# Patient Record
Sex: Female | Born: 1955
Health system: Southern US, Community
[De-identification: ages and names within clinical notes are randomized; demographics above are authoritative.]

## PROBLEM LIST (undated history)

## (undated) DIAGNOSIS — D689 Coagulation defect, unspecified: Secondary | ICD-10-CM

## (undated) DIAGNOSIS — H269 Unspecified cataract: Secondary | ICD-10-CM

## (undated) DIAGNOSIS — J342 Deviated nasal septum: Secondary | ICD-10-CM

## (undated) DIAGNOSIS — M1990 Unspecified osteoarthritis, unspecified site: Secondary | ICD-10-CM

## (undated) DIAGNOSIS — K219 Gastro-esophageal reflux disease without esophagitis: Secondary | ICD-10-CM

## (undated) DIAGNOSIS — E785 Hyperlipidemia, unspecified: Secondary | ICD-10-CM

## (undated) DIAGNOSIS — D369 Benign neoplasm, unspecified site: Secondary | ICD-10-CM

## (undated) DIAGNOSIS — G709 Myoneural disorder, unspecified: Secondary | ICD-10-CM

## (undated) DIAGNOSIS — R112 Nausea with vomiting, unspecified: Secondary | ICD-10-CM

## (undated) DIAGNOSIS — T7840XA Allergy, unspecified, initial encounter: Secondary | ICD-10-CM

## (undated) DIAGNOSIS — Z9889 Other specified postprocedural states: Secondary | ICD-10-CM

## (undated) DIAGNOSIS — E039 Hypothyroidism, unspecified: Secondary | ICD-10-CM

## (undated) DIAGNOSIS — K759 Inflammatory liver disease, unspecified: Secondary | ICD-10-CM

## (undated) DIAGNOSIS — J329 Chronic sinusitis, unspecified: Secondary | ICD-10-CM

## (undated) HISTORY — DX: Myoneural disorder, unspecified: G70.9

## (undated) HISTORY — PX: HERPES SIMPLEX VIRUS DFA: LAB15028

## (undated) HISTORY — DX: Unspecified osteoarthritis, unspecified site: M19.90

## (undated) HISTORY — DX: Allergy, unspecified, initial encounter: T78.40XA

## (undated) HISTORY — DX: Benign neoplasm, unspecified site: D36.9

## (undated) HISTORY — DX: Gastro-esophageal reflux disease without esophagitis: K21.9

## (undated) HISTORY — PX: HAND SURGERY: SHX662

## (undated) HISTORY — DX: Coagulation defect, unspecified: D68.9

## (undated) HISTORY — DX: Unspecified cataract: H26.9

## (undated) HISTORY — PX: ANTERIOR CRUCIATE LIGAMENT REPAIR: SHX115

## (undated) HISTORY — PX: KNEE SURGERY: SHX244

## (undated) HISTORY — PX: TUBAL LIGATION: SHX77

---

## 2004-12-23 ENCOUNTER — Emergency Department (HOSPITAL_COMMUNITY): Admission: EM | Admit: 2004-12-23 | Discharge: 2004-12-23 | Payer: Self-pay | Admitting: Emergency Medicine

## 2006-11-26 ENCOUNTER — Ambulatory Visit: Payer: Self-pay | Admitting: Pulmonary Disease

## 2006-12-04 ENCOUNTER — Encounter: Admission: RE | Admit: 2006-12-04 | Discharge: 2006-12-04 | Payer: Self-pay | Admitting: *Deleted

## 2006-12-08 ENCOUNTER — Ambulatory Visit (HOSPITAL_COMMUNITY): Admission: RE | Admit: 2006-12-08 | Discharge: 2006-12-08 | Payer: Self-pay | Admitting: Gastroenterology

## 2006-12-08 ENCOUNTER — Encounter (INDEPENDENT_AMBULATORY_CARE_PROVIDER_SITE_OTHER): Payer: Self-pay | Admitting: Gastroenterology

## 2006-12-26 ENCOUNTER — Ambulatory Visit: Payer: Self-pay | Admitting: Pulmonary Disease

## 2006-12-29 ENCOUNTER — Encounter: Admission: RE | Admit: 2006-12-29 | Discharge: 2006-12-29 | Payer: Self-pay | Admitting: *Deleted

## 2006-12-31 ENCOUNTER — Emergency Department (HOSPITAL_COMMUNITY): Admission: EM | Admit: 2006-12-31 | Discharge: 2006-12-31 | Payer: Self-pay | Admitting: Family Medicine

## 2007-05-13 ENCOUNTER — Ambulatory Visit (HOSPITAL_BASED_OUTPATIENT_CLINIC_OR_DEPARTMENT_OTHER): Admission: RE | Admit: 2007-05-13 | Discharge: 2007-05-13 | Payer: Self-pay | Admitting: Pulmonary Disease

## 2007-05-25 ENCOUNTER — Emergency Department (HOSPITAL_COMMUNITY): Admission: EM | Admit: 2007-05-25 | Discharge: 2007-05-25 | Payer: Self-pay | Admitting: Family Medicine

## 2007-06-05 ENCOUNTER — Ambulatory Visit: Payer: Self-pay | Admitting: Pulmonary Disease

## 2007-06-08 DIAGNOSIS — J309 Allergic rhinitis, unspecified: Secondary | ICD-10-CM

## 2007-06-08 DIAGNOSIS — G4700 Insomnia, unspecified: Secondary | ICD-10-CM

## 2007-06-11 ENCOUNTER — Encounter (INDEPENDENT_AMBULATORY_CARE_PROVIDER_SITE_OTHER): Payer: Self-pay | Admitting: *Deleted

## 2008-10-11 ENCOUNTER — Ambulatory Visit: Payer: Self-pay | Admitting: Pulmonary Disease

## 2008-10-11 DIAGNOSIS — R0989 Other specified symptoms and signs involving the circulatory and respiratory systems: Secondary | ICD-10-CM

## 2008-10-11 DIAGNOSIS — R0609 Other forms of dyspnea: Secondary | ICD-10-CM

## 2009-02-05 ENCOUNTER — Emergency Department (HOSPITAL_COMMUNITY): Admission: EM | Admit: 2009-02-05 | Discharge: 2009-02-05 | Payer: Self-pay | Admitting: Family Medicine

## 2010-07-22 ENCOUNTER — Encounter: Payer: Self-pay | Admitting: Obstetrics and Gynecology

## 2010-07-22 ENCOUNTER — Encounter: Payer: Self-pay | Admitting: *Deleted

## 2010-10-07 LAB — POCT RAPID STREP A (OFFICE): Streptococcus, Group A Screen (Direct): NEGATIVE

## 2010-11-13 NOTE — Op Note (Signed)
Sherry [name], Sherry [name]           ACCOUNT NO.:  [id number]   MEDICAL RECORD NO.:  [id number]          PATIENT TYPE:  AMB   LOCATION:  ENDO                         FACILITY:  [facility]   PHYSICIAN:  Anselmo Rod, M.D.  DATE OF BIRTH:  [birth year]   DATE OF PROCEDURE:  12/08/2006  DATE OF DISCHARGE:                               OPERATIVE REPORT   PROCEDURE PERFORMED:  Colonoscopy with snare polypectomy x1.   ENDOSCOPIST:  Anselmo Rod, M.D.   INSTRUMENT USED:  Pentax video colonoscope.   INDICATIONS FOR PROCEDURE:  A 55 year old white female undergoing  screening colonoscopy to rule out colonic polyps, masses, etc.  The  patient has a history of diarrhea in the past that has improved with a  high-fiber diet.  There is no known family history of colon cancer.   PREPROCEDURE PREPARATION:  Informed consent was procured from the  patient. The patient was fasted for 4 hours prior to the procedure and  prepped with 20 OsmoPrep pills the night of and 12 OsmoPrep pills the  morning of the procedure.  Risks and benefits of the procedure including  a 10% miss rate of cancer and polyps were discussed with the patient as  well.   PREPROCEDURE PHYSICAL:  VITAL SIGNS:  The patient had stable vital  signs.  NECK:  Supple.  CHEST:  Clear to auscultation.  S1, S2, regular.  ABDOMEN:  Soft with normal bowel sounds.   DESCRIPTION OF PROCEDURE:  The patient was placed in the left lateral  decubitus position and sedated with 100 mcg of Fentanyl and 10 mg of  Versed given intravenously in slow incremental doses.  Once the patient  was adequately sedate and maintained on low-flow oxygen and continuous  cardiac monitoring, the Pentax video colonoscope was advanced from the  rectum to the cecum.  A small sessile polyp was removed via hot snare  from the proximal right colon.  Small internal hemorrhoids were seen on  retroflexion.  The terminal ileum appeared healthy and without lesions.  No other  masses or polyps were seen.  There was no evidence of  diverticulosis.  The patient tolerated the procedure well without  immediate complications.   IMPRESSION:  1. Small sessile polyp removed by hot snare from the proximal right      colon.  2. Small internal hemorrhoids seen on retroflexion.  3. No evidence of diverticulosis.  4. Otherwise normal colonoscopy up to the terminal ileum.   RECOMMENDATIONS:  1. Await pathology results.  2. Avoid all nonsteroidals including aspirin for the next 2 weeks.  3. Repeat colonoscopy depending on pathology results.  4. Outpatient follow-up as need arises in the future.      Anselmo Rod, M.D.  Electronically Signed     JNM/MEDQ  D:  12/08/2006  T:  12/09/2006  Job:  [id number]   cc:   Gerri Spore B. Earlene Plater, M.D.  Fax: [phone]

## 2010-11-13 NOTE — Procedures (Signed)
NAME:  Sherry [name], Sherry [name]           ACCOUNT NO.:  [id number]   MEDICAL RECORD NO.:  [id number]          PATIENT TYPE:  OUT   LOCATION:  SLEEP CENTER                 FACILITY:  [facility]   PHYSICIAN:  Barbaraann Share, MD,FCCPDATE OF BIRTH:  1955/11/03   DATE OF STUDY:  05/13/2007                            NOCTURNAL POLYSOMNOGRAM   REFERRING PHYSICIAN:  Barbaraann Share, MD,FCCP   INDICATIONS FOR STUDY:  Hypersomnia with sleep apnea.   EPWORTH SLEEPINESS SCORE:  6   MEDICATIONS:   SLEEP ARCHITECTURE:  The patient had a total sleep time of 395 minutes  with decreased REM and never achieved slow wave sleep.  Sleep onset  latency was normal at 5 minutes, and REM onset was mildly prolonged at  102 minutes.  Sleep efficiency was fairly good at 93%.   RESPIRATORY DATA:  The patient was found to have 3 obstructive hypopneas  and no apneas for an apnea/hypopnea index of 0.5 events per hour.  The  events were not positional, and there was mild-to-moderate snoring noted  throughout.   OXYGEN DATA:  There is O2 desaturation as low at 88% transiently.   CARDIAC DATA:  No clinically significant cardiac arrhythmias were noted.   MOVEMENT-PARASOMNIA:  The patient was found to have no abnormal leg  jerks or behaviors.  She was found to have 68 spontaneous arousals  during the night.   IMPRESSIONS-RECOMMENDATIONS:  1. Small numbers of obstructive events which do not meet the      apnea/hypopnea index criteria for the obstructive sleep apnea      syndrome.  2. Large numbers of spontaneous arousals noted, with no obvious      etiology from the sleep study.     Barbaraann Share, MD,FCCP  Diplomate, American Board of Sleep  Medicine  Electronically Signed    KMC/MEDQ  D:  06/06/2007 11:47:11  T:  06/07/2007 10:06:41  Job:  [id number]

## 2010-11-13 NOTE — Assessment & Plan Note (Signed)
[facility]                             PULMONARY OFFICE NOTE   MARGRET, MOAT                  MRN:          [id number]  DATE:12/26/2006                            DOB:          03-14-1956    REFERRING PHYSICIAN:  [facility]   SLEEP MEDICINE CONSULTATION:   HISTORY OF PRESENT ILLNESS:  Patient is a 55 year old white female who I  have been asked to see for sleeping difficulties.  Patient states that  she has had great difficulty with both sleep onset and sleep  maintenance.  She used to work the night shift and weekend options for 8-  9 years but now has started working a job from 6 a.m. to 6 p.m. but also  works a second job at night on varying nights.  She typically gets to  bed between 9 and 11 and gets up at 4:15 a.m. whenever she goes to work  and 9 a.m. when she is off.  She has not rested upon arising.  Patient  states that very frequently, she will not get to sleep until 1 a.m. and  will often sit in the bed and toss and turn if she is unable to initiate  sleep.  Then, even if she does fall asleep, she will awaken very often,  3-4 times, and have difficulty getting back to sleep.  Unfortunately,  she has a 87 year old son who sleeps in the same room with her.  She  does not watch TV or read in bed.  She does have a significant sense in  frustration in initiating sleep.  Patient does note significant sleep  pressure during the day with periods of inactivity.  This does interfere  with her concentration.  She can doze very easily with TV or movies.  She has been noted to have loud snoring, and she admits to some choking  arousals.   PAST MEDICAL HISTORY:  1. Allergic rhinitis.  2. Multiple orthopedic procedures.   CURRENT MEDICATIONS:  1. Wellbutrin 150 daily.  2. Fish oil 1000 daily.  3. Zyrtec p.r.n.   Patient has no known drug allergies.   SOCIAL HISTORY:  She works as a Engineer, civil (consulting).  She has never smoked.  She is  divorced and has a child.   FAMILY HISTORY:  Remarkable for father having had lung cancer and mother  having had leukemia.   REVIEW OF SYSTEMS:  As per history of present illness.  Also see patient  intake form documented on the chart.   PHYSICAL EXAMINATION:  GENERAL:  In general, she is an overweight female  in no acute distress.  VITAL SIGNS:  Blood pressure 130/68, pulse 92, temperature 97.7.  Weight  is 188 pounds.  She is 5 feet 6 inches tall.  O2 saturation on room air  is 100%.  HEENT:  Pupils are equal, round and reactive to light and accommodation.  Extraocular muscles are intact.  Nares showed a deviated septum to the  left with partial obstruction.  Oropharynx is clear.  NECK:  Supple without JVD or lymphadenopathy.  There is no palpable  thyromegaly.  CHEST:  Totally clear to auscultation.  CARDIAC:  Regular rate and rhythm.  No murmurs, rubs or gallops.  ABDOMEN:  Soft and nontender with good bowel sounds.  GENITAL/BREAST/RECTAL:  Not done, not indicated.  LOWER EXTREMITIES:  Without edema.  Pulses are intact distally.  NEUROLOGIC:  Alert and oriented with no obvious motor deficits.   IMPRESSION:  Psychophysiologic insomnia:  Patient clearly has a sense of  frustration about initiating sleep, and this has definitely been a  longstanding problem for her.  I certainly cannot exclude the  possibility of obstructive sleep apnea; however, she would never be able  to have a successful sleep study with her degree of insomnia at this  time.  I also think there are significant sleep hygiene issues with her  son sleeping in the room and her irregular work schedule.  At this point  in time, I would like to try and get her initiating and maintaining  sleep a little better, to try and see if she has significant improvement  in her daytime symptoms.  If not, then she will need further evaluation  for a sleep disordered breathing.   PLAN:  1. I have asked the patient to work on  sleep hygiene, such as a      regular sleep-work cycle and also asking her son to sleep in a      different room.  She states that this may be very difficult.  2. A trial of stimulus control therapy where she does not stay in bed      if she is not able to initiate sleep within 20-30 minutes.  I have      gone over the details of this therapy with her.  3. A trial of trazodone 50 mg nightly along with Rozarem 8 mg nightly      to help break her insomnia cycle and hopefully get her sleeping      better.  This will be used just short-term.  4. Patient is to follow up in 3-4 weeks or sooner if there are      problems.     Barbaraann Share, MD,FCCP  Electronically Signed    KMC/MedQ  DD: 12/30/2006  DT: 12/30/2006  Job #: [id number]   cc:   [facility]

## 2011-08-26 ENCOUNTER — Encounter (HOSPITAL_BASED_OUTPATIENT_CLINIC_OR_DEPARTMENT_OTHER): Payer: Self-pay | Admitting: *Deleted

## 2011-08-30 ENCOUNTER — Ambulatory Visit (HOSPITAL_BASED_OUTPATIENT_CLINIC_OR_DEPARTMENT_OTHER): Payer: 59 | Admitting: Anesthesiology

## 2011-08-30 ENCOUNTER — Encounter (HOSPITAL_BASED_OUTPATIENT_CLINIC_OR_DEPARTMENT_OTHER): Payer: Self-pay | Admitting: *Deleted

## 2011-08-30 ENCOUNTER — Encounter (HOSPITAL_BASED_OUTPATIENT_CLINIC_OR_DEPARTMENT_OTHER)
Admission: RE | Disposition: A | Discharge status: Home / Self Care | Payer: Self-pay | Source: Ambulatory Visit | Attending: Otolaryngology

## 2011-08-30 ENCOUNTER — Ambulatory Visit (HOSPITAL_BASED_OUTPATIENT_CLINIC_OR_DEPARTMENT_OTHER)
Admission: RE | Admit: 2011-08-30 | Discharge: 2011-08-30 | Disposition: A | Discharge status: Home / Self Care | Payer: 59 | Source: Ambulatory Visit | Attending: Otolaryngology | Admitting: Otolaryngology

## 2011-08-30 ENCOUNTER — Encounter (HOSPITAL_BASED_OUTPATIENT_CLINIC_OR_DEPARTMENT_OTHER): Payer: Self-pay | Admitting: Anesthesiology

## 2011-08-30 DIAGNOSIS — J343 Hypertrophy of nasal turbinates: Secondary | ICD-10-CM | POA: Insufficient documentation

## 2011-08-30 DIAGNOSIS — J342 Deviated nasal septum: Secondary | ICD-10-CM | POA: Diagnosis present

## 2011-08-30 DIAGNOSIS — J329 Chronic sinusitis, unspecified: Secondary | ICD-10-CM | POA: Diagnosis present

## 2011-08-30 HISTORY — DX: Chronic sinusitis, unspecified: J32.9

## 2011-08-30 HISTORY — DX: Other specified postprocedural states: R11.2

## 2011-08-30 HISTORY — PX: NASAL SINUS SURGERY: SHX719

## 2011-08-30 HISTORY — DX: Deviated nasal septum: J34.2

## 2011-08-30 HISTORY — PX: NASAL SEPTOPLASTY W/ TURBINOPLASTY: SHX2070

## 2011-08-30 HISTORY — DX: Hypothyroidism, unspecified: E03.9

## 2011-08-30 HISTORY — DX: Inflammatory liver disease, unspecified: K75.9

## 2011-08-30 HISTORY — DX: Other specified postprocedural states: Z98.890

## 2011-08-30 SURGERY — SEPTOPLASTY, NOSE, WITH NASAL TURBINATE REDUCTION
Anesthesia: General | Site: Nose | Wound class: Clean Contaminated

## 2011-08-30 MED ORDER — LIDOCAINE-EPINEPHRINE 1 %-1:100000 IJ SOLN
INTRAMUSCULAR | Status: DC | PRN
  Administered 2011-08-30: 8 mL

## 2011-08-30 MED ORDER — FENTANYL CITRATE 0.05 MG/ML IJ SOLN
INTRAMUSCULAR | Status: DC | PRN
  Administered 2011-08-30 (×2): 25 ug via INTRAVENOUS
  Administered 2011-08-30: 100 ug via INTRAVENOUS

## 2011-08-30 MED ORDER — CEFAZOLIN SODIUM 1-5 GM-% IV SOLN
INTRAVENOUS | Status: DC | PRN
  Administered 2011-08-30: 2 g via INTRAVENOUS

## 2011-08-30 MED ORDER — AMOXICILLIN-POT CLAVULANATE 500-125 MG PO TABS: 1.0000 | ORAL_TABLET | Freq: Two times a day (BID) | ORAL | Status: AC

## 2011-08-30 MED ORDER — DEXAMETHASONE SODIUM PHOSPHATE 4 MG/ML IJ SOLN
INTRAMUSCULAR | Status: DC | PRN
  Administered 2011-08-30: 10 mg via INTRAVENOUS

## 2011-08-30 MED ORDER — LIDOCAINE HCL (CARDIAC) 20 MG/ML IV SOLN
INTRAVENOUS | Status: DC | PRN
  Administered 2011-08-30: 40 mg via INTRAVENOUS

## 2011-08-30 MED ORDER — MEPERIDINE HCL 25 MG/ML IJ SOLN: 6.2500 mg | INTRAMUSCULAR | Status: DC | PRN

## 2011-08-30 MED ORDER — PROPOFOL 10 MG/ML IV EMUL
INTRAVENOUS | Status: DC | PRN
  Administered 2011-08-30: 130 mg via INTRAVENOUS

## 2011-08-30 MED ORDER — OXYMETAZOLINE HCL 0.05 % NA SOLN
NASAL | Status: DC | PRN
  Administered 2011-08-30: 1 via NASAL

## 2011-08-30 MED ORDER — MUPIROCIN 2 % EX OINT
TOPICAL_OINTMENT | CUTANEOUS | Status: DC | PRN
  Administered 2011-08-30: 1 via NASAL

## 2011-08-30 MED ORDER — ONDANSETRON HCL 4 MG/2ML IJ SOLN
INTRAMUSCULAR | Status: DC | PRN
  Administered 2011-08-30: 4 mg via INTRAVENOUS

## 2011-08-30 MED ORDER — SUCCINYLCHOLINE CHLORIDE 20 MG/ML IJ SOLN
INTRAMUSCULAR | Status: DC | PRN
  Administered 2011-08-30: 110 mg via INTRAVENOUS

## 2011-08-30 MED ORDER — MIDAZOLAM HCL 5 MG/5ML IJ SOLN
INTRAMUSCULAR | Status: DC | PRN
  Administered 2011-08-30: 2 mg via INTRAVENOUS

## 2011-08-30 MED ORDER — ONDANSETRON HCL 4 MG PO TABS: 4.0000 mg | ORAL_TABLET | Freq: Three times a day (TID) | ORAL | Status: AC | PRN

## 2011-08-30 MED ORDER — HYDROMORPHONE HCL PF 1 MG/ML IJ SOLN: 0.2500 mg | INTRAMUSCULAR | Status: DC | PRN

## 2011-08-30 MED ORDER — DROPERIDOL 2.5 MG/ML IJ SOLN
0.6250 mg | Freq: Once | INTRAMUSCULAR | Status: AC
  Administered 2011-08-30: 0.625 mg via INTRAVENOUS

## 2011-08-30 MED ORDER — HYDROCODONE-ACETAMINOPHEN 5-325 MG PO TABS: 1.0000 | ORAL_TABLET | Freq: Four times a day (QID) | ORAL | Status: AC | PRN

## 2011-08-30 MED ORDER — LACTATED RINGERS IV SOLN
INTRAVENOUS | Status: DC
  Administered 2011-08-30 (×2): via INTRAVENOUS

## 2011-08-30 MED ORDER — ACETAMINOPHEN 10 MG/ML IV SOLN
1000.0000 mg | Freq: Once | INTRAVENOUS | Status: AC
  Administered 2011-08-30: 1000 mg via INTRAVENOUS

## 2011-08-30 SURGICAL SUPPLY — 61 items
ATTRACTOMAT 16X20 MAGNETIC DRP (DRAPES) IMPLANT
BLADE RAD40 ROTATE 4M 4 5PK (BLADE) ×1 IMPLANT
BLADE RAD60 ROTATE M4 4 5PK (BLADE) IMPLANT
BLADE ROTATE RAD12 5PK M4 4MM (BLADE) IMPLANT
BLADE SURG 15 STRL LF DISP TIS (BLADE) ×1 IMPLANT
BLADE SURG 15 STRL SS (BLADE) ×2
BLADE TRICUT ROTATE M4 4 5PK (BLADE) ×1 IMPLANT
BUR HS RAD FRONTAL 3 (BURR) IMPLANT
CANISTER SUC SOCK COL 7 IN (MISCELLANEOUS) ×2 IMPLANT
CANISTER SUCTION 1200CC (MISCELLANEOUS) ×3 IMPLANT
CATH SINUS BALLN 7X16 (CATHETERS) IMPLANT
CATH SINUS BALLN RELIEV 6X16 (SINUPLASTY) IMPLANT
CATH SINUS GUIDE F-70 (CATHETERS) IMPLANT
CATH SINUS GUIDE M/110 (CATHETERS) IMPLANT
CATH SINUS IRRIGATION 2.0 (CATHETERS) IMPLANT
CLOTH BEACON ORANGE TIMEOUT ST (SAFETY) ×2 IMPLANT
COAGULATOR SUCT SWTCH 10FR 6 (ELECTROSURGICAL) ×1 IMPLANT
DECANTER SPIKE VIAL GLASS SM (MISCELLANEOUS) IMPLANT
DEVICE INFLATION 20/61 (MISCELLANEOUS) IMPLANT
DRESSING NASAL KENNEDY 3.5X.9 (MISCELLANEOUS) IMPLANT
DRSG NASAL KENNEDY 3.5X.9 (MISCELLANEOUS)
DRSG NASOPORE 8CM (GAUZE/BANDAGES/DRESSINGS) IMPLANT
ELECT COATED BLADE 2.86 ST (ELECTRODE) IMPLANT
ELECT REM PT RETURN 9FT ADLT (ELECTROSURGICAL)
ELECTRODE REM PT RTRN 9FT ADLT (ELECTROSURGICAL) IMPLANT
GLOVE BIO SURGEON STRL SZ 6.5 (GLOVE) ×1 IMPLANT
GLOVE BIOGEL M 7.0 STRL (GLOVE) ×4 IMPLANT
GLOVE SKINSENSE NS SZ7.0 (GLOVE) ×1
GLOVE SKINSENSE STRL SZ7.0 (GLOVE) IMPLANT
GOWN PREVENTION PLUS XLARGE (GOWN DISPOSABLE) ×4 IMPLANT
HANDLE SINUS GUIDE LP (INSTRUMENTS) IMPLANT
HANDPIECE HYDRODEBRIDER FRONT (BLADE) IMPLANT
IV NS 500ML (IV SOLUTION) ×2
IV NS 500ML BAXH (IV SOLUTION) ×1 IMPLANT
NEEDLE 27GAX1X1/2 (NEEDLE) ×2 IMPLANT
NS IRRIG 1000ML POUR BTL (IV SOLUTION) IMPLANT
PACK BASIN DAY SURGERY FS (CUSTOM PROCEDURE TRAY) ×2 IMPLANT
PACK ENT DAY SURGERY (CUSTOM PROCEDURE TRAY) ×2 IMPLANT
PENCIL BUTTON HOLSTER BLD 10FT (ELECTRODE) IMPLANT
SET EXT MALE ROTATING LL 32IN (MISCELLANEOUS) ×2 IMPLANT
SET IV EXT TUBING FEMALE 31 (MISCELLANEOUS) ×1 IMPLANT
SOLUTION BUTLER CLEAR DIP (MISCELLANEOUS) ×2 IMPLANT
SPLINT NASAL DOYLE BI-VL (GAUZE/BANDAGES/DRESSINGS) ×2 IMPLANT
SPONGE GAUZE 2X2 12PLY UNSTER (GAUZE/BANDAGES/DRESSINGS) ×1 IMPLANT
SPONGE GAUZE 2X2 8PLY STRL LF (GAUZE/BANDAGES/DRESSINGS) ×2 IMPLANT
SPONGE NEURO XRAY DETECT 1X3 (DISPOSABLE) ×2 IMPLANT
SPONGE SURGIFOAM ABS GEL 12-7 (HEMOSTASIS) IMPLANT
SUT ETHILON 3 0 PS 1 (SUTURE) ×2 IMPLANT
SUT PLAIN 4 0 ~~LOC~~ 1 (SUTURE) ×2 IMPLANT
SUT SILK 3 0 PS 1 (SUTURE) IMPLANT
SYR 3ML 23GX1 SAFETY (SYRINGE) IMPLANT
SYSTEM HYDRODEBRIDER (MISCELLANEOUS) IMPLANT
SYSTEM RELIEVA LUMA ILLUM (SINUPLASTY) IMPLANT
TAPE SURG TRANSPORE 1 IN (GAUZE/BANDAGES/DRESSINGS) IMPLANT
TAPE SURGICAL TRANSPORE 1 IN (GAUZE/BANDAGES/DRESSINGS) ×1
TOWEL OR 17X24 6PK STRL BLUE (TOWEL DISPOSABLE) ×2 IMPLANT
TUBE CONNECTING 20X1/4 (TUBING) ×2 IMPLANT
TUBE SALEM SUMP 12R W/ARV (TUBING) IMPLANT
TUBE SALEM SUMP 16 FR W/ARV (TUBING) ×1 IMPLANT
WATER STERILE IRR 1000ML POUR (IV SOLUTION) ×4 IMPLANT
YANKAUER SUCT BULB TIP NO VENT (SUCTIONS) ×2 IMPLANT

## 2011-08-30 NOTE — Anesthesia Postprocedure Evaluation (Signed)
  Anesthesia Post-op Note  Patient: Sherry [name] [id number]  Procedure(s) Performed: Procedure(s) (LRB): NASAL SEPTOPLASTY WITH TURBINATE REDUCTION (N/A) ENDOSCOPIC SINUS SURGERY (N/A)  Patient Location: PACU  Anesthesia Type: General  Level of Consciousness: awake  Airway and Oxygen Therapy: Patient Spontanous Breathing and Patient connected to face mask oxygen  Post-op Pain: mild  Post-op Assessment: Post-op Vital signs reviewed, Patient's Cardiovascular Status Stable, Respiratory Function Stable, Patent Airway and NAUSEA AND VOMITING PRESENT  Post-op Vital Signs: Reviewed and stable  Complications: No apparent anesthesia complications

## 2011-08-30 NOTE — Anesthesia Procedure Notes (Signed)
Procedure Name: Intubation Date/Time: 08/30/2011 9:25 AM Performed by: Radford Pax Pre-anesthesia Checklist: Patient identified, Emergency Drugs available, Suction available, Patient being monitored and Timeout performed Patient Re-evaluated:Patient Re-evaluated prior to inductionOxygen Delivery Method: Circle System Utilized Preoxygenation: Pre-oxygenation with 100% oxygen Intubation Type: IV induction Ventilation: Mask ventilation without difficulty Laryngoscope Size: Miller and 3 Grade View: Grade I Tube type: Oral Tube size: 7.0 mm Number of attempts: 1 (cords open and clear, atraumatic) Airway Equipment and Method: stylet Placement Confirmation: ETT inserted through vocal cords under direct vision,  positive ETCO2 and breath sounds checked- equal and bilateral Tube secured with: Tape (pink tape used, taped on left) Dental Injury: Teeth and Oropharynx as per pre-operative assessment

## 2011-08-30 NOTE — Progress Notes (Signed)
On admission to PACU pt immediately felt nausea, turned herself to the left side, placed emesis basin near her. Notified Dr.Fitzgerald of pt's history of PONV , c/o nausea. Ordered to given Droperidol 0.625 mg IV for nausea

## 2011-08-30 NOTE — Discharge Instructions (Addendum)
Nasal Septal Reconstruction Nasal septal reconstruction or nasal septoplasty is a procedure to straighten the nasal septum. The nasal septum is a wall that separates the two nostrils and nasal passages. It is slightly off center in most people. If the septum is severely deviated, it may result in problems, such as difficulty in breathing through the nose. The bend in the septum may be present at birth or could be due to an injury. This procedure is done if you have any of the following problems.  Deviation of the nasal septum.   Repeated infection of the sinuses (air-filled cavities in the skull).   Pain due to the deviated septum.   Loss of smell due to the deviated septum.   A blood clot in the septum that interferes your breathing.   Frequent nosebleeds.  If the outside of the nose is bent, it may have to be reconstructed by a surgery called rhinoplasty. Sometimes, this procedure may be combined with septoplasty. LET YOUR CAREGIVER KNOW ABOUT:   Allergies.   Previous problems with anesthetics.   History of bleeding or blood problems.   Any medicines that you are currently taking.  RISKS AND COMPLICATIONS  You may have a hole in the septum.   You may have a collection of blood in the septum.   You may develop loss of sensation in the upper lip or teeth.   You may develop an infection.   You may have bleeding.   The front portion of your nose may become flatter than what it was before the procedure.   You may develop a reaction to the anesthetic used.   You may have a recurrence of the nasal obstruction.  BEFORE THE PROCEDURE   Follow the instructions given by your caregiver.   Your caregiver may recommend x-ray and blood tests.   Your caregiver may advise you to stop smoking for at least 2 weeks before the procedure.   Your caregiver may advise you to stop taking aspirin and anti-inflammatory medications such as ibuprofen, 10 days before surgery, as these medicines  can cause bleeding.  If the surgery is going to be under general anesthesia:  You may be advised to eat only a light meal, such as soup or salad the previous night.   You will be advised to avoid eating or drinking anything after midnight and also in the morning of the procedure.  PROCEDURE  If the procedure is being done under general anesthesia, you may be put to sleep. You will not feel the pain. You will not be aware of the procedure. It can also be done under local anesthesia with sedation where the area of the surgery is numbed. The surgeon then makes a cut on the inner lining of the septum. If there is a blood clot, it is drained. The bone and cartilage of the septum are reshaped. The straightened septum is held in place using plastic sheets or splints. Your nose is then packed with gauze to control the bleeding. The procedure may take one to one and a half hours. It generally does not cause bruising or black eyes. AFTER THE PROCEDURE   You may be kept in the recovery room till the effect of the anesthesia wears off.   You may be then brought to your room in the hospital.   You may be asked to breathe through your mouth.   Your nose packing may need to stay in place for 3 to 4 days.   You may  be given medicines for discomfort and nausea.   You may be given antibiotics.   You may be allowed to go home on the same day or have to stay in the hospital for a few days.  HOME CARE INSTRUCTIONS   Do not blow your nose.   Avoid doing heavy work and strenuous exercise for at least one week after the procedure.   Avoid pushing or moving your nose before it heals.   Avoid lifting weight and bending forwards.   Avoid using products that contain aspirin.   Keep your head raised while lying down.   Take the medicines as instructed by your caregiver.   Inform your caregiver if you have any problems after taking your medicine.  SEEK MEDICAL CARE IF:   You have a new symptom.   You  have doubts regarding the procedure or its outcome.  SEEK IMMEDIATE MEDICAL CARE IF:   You develop fever over 102 F (38.9 C).   You have severe difficulty in breathing.   Your nose continues to bleed even after you keep your head raised and apply ice to your forehead and nose for 10 to 15 minutes.  Document Released: 09/24/2007 Document Revised: 02/27/2011 Document Reviewed: 09/24/2007 [facility] Patient Information 2012 [place], [place].  [facility]  [address] [place], [place] [address] [phone]   Post Anesthesia Home Care Instructions  Activity: Get plenty of rest for the remainder of the day. A responsible adult should stay with you for 24 hours following the procedure.  For the next 24 hours, DO NOT: -Drive a car -Advertising copywriter -Drink alcoholic beverages -Take any medication unless instructed by your physician -Make any legal decisions or sign important papers.  Meals: Start with liquid foods such as gelatin or soup. Progress to regular foods as tolerated. Avoid greasy, spicy, heavy foods. If nausea and/or vomiting occur, drink only clear liquids until the nausea and/or vomiting subsides. Call your physician if vomiting continues.  Special Instructions/Symptoms: Your throat may feel dry or sore from the anesthesia or the breathing tube placed in your throat during surgery. If this causes discomfort, gargle with warm salt water. The discomfort should disappear within 24 hours.

## 2011-08-30 NOTE — H&P (Signed)
Sherry [name] [id number] is an 56 y.o. female.   Chief Complaint: Nasal obstruction and recurrent sinusitis  HPI: progressive sx of infection and blockage  Past Medical History  Diagnosis Date  . Complication of anesthesia   . PONV (postoperative nausea and vomiting)   . Hepatitis     hx hep b 1984  . Hypothyroidism   . Deviated septum   . Sinusitis, chronic     Past Surgical History  Procedure Date  . Herpes simplex virus dfa   . Anterior cruciate ligament repair rt  . Tubal ligation   . Hand surgery     lt    History reviewed. No pertinent family history. Social History:  reports that she has never smoked. She does not have any smokeless tobacco history on file. She reports that she does not drink alcohol or use illicit drugs.  Allergies: No Known Allergies  Medications Prior to Admission  Medication Dose Route Frequency Provider Last Rate Last Dose  . acetaminophen (OFIRMEV) IV 1,000 mg  1,000 mg Intravenous Once Zenon Mayo, MD   1,000 mg at 08/30/11 0865  . lactated ringers infusion   Intravenous Continuous Bedelia Person, MD 20 mL/hr at 08/30/11 [phone]     No current outpatient prescriptions on file as of 08/30/2011.    No results found for this or any previous visit (from the past 48 hour(s)). No results found.  Review of Systems  Constitutional: Negative.   Respiratory: Negative.   Cardiovascular: Negative.   Musculoskeletal: Negative.   Skin: Negative.   Neurological: Negative.     Blood pressure 135/83, pulse 85, temperature 97.7 F (36.5 C), temperature source Oral, resp. rate 20, height 5\' 6"  (1.676 m), weight 83.915 kg (185 lb), SpO2 100.00%. Physical Exam  Constitutional: She is oriented to person, place, and time. She appears well-developed and well-nourished.  HENT:       Dev. Nasal septum Nasal obstruction  Neck: Normal range of motion. Neck supple.  Cardiovascular: Normal rate and regular rhythm.   Respiratory: Effort normal and breath sounds  normal.  GI: Soft.  Musculoskeletal: Normal range of motion.  Neurological: She is alert and oriented to person, place, and time.     Assessment/Plan Adm for op nasal surgery  Isa Hitz 08/30/2011, 8:37 AM

## 2011-08-30 NOTE — Transfer of Care (Signed)
Immediate Anesthesia Transfer of Care Note  Patient: Sherry [name] [id number]  Procedure(s) Performed: Procedure(s) (LRB): NASAL SEPTOPLASTY WITH TURBINATE REDUCTION (N/A) ENDOSCOPIC SINUS SURGERY (N/A)  Patient Location: PACU  Anesthesia Type: General  Level of Consciousness: awake, alert , oriented and patient cooperative  Airway & Oxygen Therapy: Patient Spontanous Breathing and aerosol face mask  Post-op Assessment: Report given to PACU RN and Post -op Vital signs reviewed and stable  Post vital signs: Reviewed and stable  Complications: No apparent anesthesia complications

## 2011-08-30 NOTE — Anesthesia Preprocedure Evaluation (Signed)
Anesthesia Evaluation  Patient identified by MRN, date of birth, ID band Patient awake    Reviewed: Allergy & Precautions, H&P , NPO status , Patient's Chart, lab work & pertinent test results  History of Anesthesia Complications (+) PONV  Airway Mallampati: II TM Distance: >3 FB Neck ROM: Full    Dental No notable dental hx. (+) Teeth Intact   Pulmonary neg pulmonary ROS,    Pulmonary exam normal       Cardiovascular neg cardio ROS     Neuro/Psych Negative Neurological ROS  Negative Psych ROS   GI/Hepatic negative GI ROS, (+) Hepatitis -, B  Endo/Other  Negative Endocrine ROS  Renal/GU negative Renal ROS  Genitourinary negative   Musculoskeletal   Abdominal   Peds  Hematology negative hematology ROS (+)   Anesthesia Other Findings   Reproductive/Obstetrics negative OB ROS                           Anesthesia Physical Anesthesia Plan  ASA: II  Anesthesia Plan: General   Post-op Pain Management:    Induction: Intravenous  Airway Management Planned: Oral ETT  Additional Equipment:   Intra-op Plan:   Post-operative Plan: Extubation in OR  Informed Consent: I have reviewed the patients History and Physical, chart, labs and discussed the procedure including the risks, benefits and alternatives for the proposed anesthesia with the patient or authorized representative who has indicated his/her understanding and acceptance.     Plan Discussed with: CRNA  Anesthesia Plan Comments:         Anesthesia Quick Evaluation

## 2011-08-30 NOTE — Brief Op Note (Signed)
08/30/2011  11:14 AM  PATIENT:  Sherry [name] [id number]  56 y.o. female  PRE-OPERATIVE DIAGNOSIS:  chronic sinusitis  POST-OPERATIVE DIAGNOSIS:  chronic sinusitis  PROCEDURE:  Procedure(s) (LRB): NASAL SEPTOPLASTY WITH TURBINATE REDUCTION (N/A) ENDOSCOPIC SINUS SURGERY (N/A)  SURGEON:  Surgeon(s) and Role:    * Osborn Coho, MD - Primary  PHYSICIAN ASSISTANT:   ASSISTANTS: none   ANESTHESIA:   general  EBL:  Total I/O In: 1500 [I.V.:1500] Out: - 150  BLOOD ADMINISTERED:none  DRAINS: none   LOCAL MEDICATIONS USED:  LIDOCAINE  and Amount: 8 ml  SPECIMEN:  Source of Specimen:  Bil sinus contents  DISPOSITION OF SPECIMEN:  PATHOLOGY  COUNTS:  YES  TOURNIQUET:  * No tourniquets in log *  DICTATION: .Other Dictation: Dictation Number [id number]  PLAN OF CARE: Discharge to home after PACU  PATIENT DISPOSITION:  PACU - hemodynamically stable.   Delay start of Pharmacological VTE agent (>24hrs) due to surgical blood loss or risk of bleeding: not applicable

## 2011-08-31 NOTE — Op Note (Signed)
NAME:  Sherry [name], Sherry [name]                ACCOUNT NO.:  MEDICAL RECORD NO.:  [id number]  LOCATION:                                 FACILITY:  PHYSICIAN:  Kinnie Scales. Annalee Genta, M.D.    DATE OF BIRTH:  DATE OF PROCEDURE:  08/30/2011 DATE OF DISCHARGE:                              OPERATIVE REPORT   PREOPERATIVE DIAGNOSES: 1. Deviated nasal septum after prior septoplasty. 2. Inferior turbinate hypertrophy. 3. Bilateral middle turbinate concha bullosa. 4. Chronic sinusitis.  POSTOPERATIVE DIAGNOSES: 1. Deviated nasal septum after prior septoplasty. 2. Inferior turbinate hypertrophy. 3. Bilateral middle turbinate concha bullosa. 4. Chronic sinusitis.  INDICATION FOR SURGERY: 1. Deviated nasal septum after prior septoplasty. 2. Inferior turbinate hypertrophy. 3. Bilateral middle turbinate concha bullosa. 4. Chronic sinusitis.  SURGICAL PROCEDURE: 1. Bilateral endoscopic sinus surgery consisting of endoscopic     resection of concha bullosa, anterior ethmoidectomy, maxillary     antrostomy, nasofrontal recess exploration. 2. Nasal septoplasty. 3. Bilateral inferior turbinate reduction.  ANESTHESIA:  General endotracheal.  COMPLICATIONS:  None.  ESTIMATED BLOOD LOSS:  150 mL.  The patient was transferred from the operating room to the recovery room in stable condition.  FINDINGS:  Large bilateral middle turbinate concha bullosa resected. Bilateral Doyle nasal septal splints placed after the application of Bactroban ointment, no packing.  BRIEF HISTORY:  The patient is a 56 year old white female, who was referred for evaluation of progressive symptoms of nasal airway obstruction.  She had undergone previous nasal septoplasty over 20 years ago and had noted increasing symptoms of nasal blockage and obstruction. Examination showed significant residual deviated septum with right-sided partial nasal airway obstruction and bilateral inferior middle turbinate hypertrophy.  The  patient was treated with oral antibiotics for recurrent and chronic sinusitis, topical nasal steroids, saline spray, and antihistamine and after inclusion of antibiotic therapy, a CT scan was obtained.  The patient was found to have a deviated septum, bilateral inferior turbinate hypertrophy, large bilateral concha bullosa, and obstruction of the ostiomeatal complex bilaterally with mucosal thickening in the maxillary sinuses.  Given her history, examination, and physical findings with failure to respond to appropriate medical therapy, I recommended her to undertake the above surgical procedures.  The risks, benefits, and possible complications of these procedures were discussed in detail with the patient and her family.  They understood and concurred with our plan for surgery, which is scheduled as an outpatient under general anesthesia on August 30, 2011.  PROCEDURE:  The patient was brought to the operating room and placed in supine position on the operating table.  General endotracheal anesthesia was established without difficulty.  When the patient was adequately anesthetized, he was positioned on the operating table and prepped and draped in a sterile fashion.  She was then injected with 8 mL of 1% lidocaine with 1:100,000 solution epinephrine injected in submucosal fashion along the nasal septum, inferior middle turbinates, and lateral nasal wall bilaterally.  The patient's nose then packed with Afrin- soaked cotton pledgets, which were left in place for approximately 10 minutes for vasoconstriction, hemostasis.  The patient was positioned, prepped and draped, and the procedure was begun with examination of the left nasal cavity by using  the 0 degree endoscope.  The patient had a large middle turbinate concha bullosa.  A vertically-oriented incision was created in the anterior face of the concha and carried through to the sinus within.  Using curved and straight endoscopic  scissors, the lateral wall of the concha bullosa was resected preserving the medial wall and its mucosa.  Attention then turned to the lateral nasal wall where the uncinate process was carefully dissected from the lamina and elevated anteriorly and this was resected.  The dissection was then carried from anterior to posterior along the anterior ethmoid region dissecting through the ethmoid bulla from inferior to superior.  Nasofrontal recess was identified and underlying ethmoid, bony air cells, and hypertrophied osteitic bone were resected with a curved microdebrider and a 45-degree telescope.  The nasal frontal recess was widely patent at the conclusion of the procedure.  Attention then turned to the lateral nasal wall where the natural os in the maxillary sinus was identified and a large in anterior and inferior direction.  Within the sinus, there was a small amount of mucoid material, but no evidence of active infection.  A revision nasal septoplasty was then performed.  An anterior hemitransfixion incision was created in the mucosa on the left-hand side and mucoperichondrial flap elevated from anterior to posterior. Cartilaginous septal tissue was crossed the midline and mucoperichondrial flap elevated on the patient's right-hand side.  There was a moderate amount of scarring from previous surgical procedure as well as some residual cartilage, which was resected and then later returned to the mucoperichondrial pocket.  A large inferior septal bony spur was resected after mobilizing the overlying mucosal.  Dissection then carried from anterior to posterior, bring the septum to a good midline position.  At the conclusion of the surgical procedure, the resected cartilage was morselized and returned to the mucoperichondrial pocket, and the flaps were reapproximated with a 4-0 gut suture on a Keith needle in a horizontal mattress fashion.  At the conclusion of the surgical procedure,  bilateral Doyle nasal septal splints were placed after the application of Bactroban ointment and sutured in position with a 3-0 Ethilon suture.  Attention then turned to the right side where endoscopic sinus surgery was undertaken.  Again using a 0 degree telescope, the anatomy was inspected.  The patient had a large concha bullosa.  A vertically oriented incision was created and the lateral aspect of the concha was resected endoscopically.  The uncinate process reflected anteriorly and resected.  The natural os of the maxillary sinus was identified and then enlarged in an inferior and anterior direction.  No active infection or polyps within the sinus.  Attention was then turned to the ethmoid region where using a 45-degree telescope and a curved microdebrider, dissection was carried from inferior to superior along the ethmoid bulla resecting anterior ethmoid air cells to the level of the nasal frontal recess.  Again the nasal frontal recess was identified and underlying this area, there was a moderate amount of diseased osteitic bone with obstruction of the nasal frontal recess.  Using a curved microdebrider and blunt and sharp dissection, this area was carefully dissected to create a widely patent nasal frontal recess.  Bilateral inferior turbinate reduction was performed with cautery set at 12 watts 2 submucosal passes were made in each inferior turbinate.  With the turbinates had been adequately cauterized, an anterior incision was created on each side and overlying soft tissue elevated and small amount of turbinate bone was resected.  The turbinates were  then outfractured by creating more patent nasal cavity.  The patient's nasal cavity was then thoroughly inspected with the 0 degree endoscope.  Surgical debris was cleared.  The sinuses were irrigated.  There was no active bleeding.  Bilateral Doyle nasal septal splints were placed and sutured in position with a 3-0 Ethilon  suture. These were placed within the nasal cavity and middle meatus in order to medialize the middle turbinate remnant.  Oral cavity was suctioned and an orogastric tube was passed.  The stomach contents were aspirated. The patient was then awakened from anesthetic, extubated, and transferred from the operating room to recovery room in stable condition.          ______________________________ Kinnie Scales Annalee Genta, M.D.     DLS/MEDQ  D:  [id number]/[id number]  T:  08/30/2011  Job:  [id number]

## 2011-09-02 ENCOUNTER — Encounter (HOSPITAL_BASED_OUTPATIENT_CLINIC_OR_DEPARTMENT_OTHER): Payer: Self-pay | Admitting: Otolaryngology

## 2012-03-26 DIAGNOSIS — B009 Herpesviral infection, unspecified: Secondary | ICD-10-CM | POA: Insufficient documentation

## 2013-02-10 ENCOUNTER — Encounter: Payer: Self-pay | Admitting: *Deleted

## 2013-02-10 ENCOUNTER — Emergency Department (INDEPENDENT_AMBULATORY_CARE_PROVIDER_SITE_OTHER)
Admission: EM | Admit: 2013-02-10 | Discharge: 2013-02-10 | Disposition: A | Discharge status: Home / Self Care | Payer: 59 | Source: Home / Self Care | Attending: Family Medicine | Admitting: Family Medicine

## 2013-02-10 DIAGNOSIS — T6391XA Toxic effect of contact with unspecified venomous animal, accidental (unintentional), initial encounter: Secondary | ICD-10-CM

## 2013-02-10 HISTORY — DX: Hyperlipidemia, unspecified: E78.5

## 2013-02-10 MED ORDER — TRIAMCINOLONE ACETONIDE 40 MG/ML IJ SUSP
40.0000 mg | Freq: Once | INTRAMUSCULAR | Status: AC
  Administered 2013-02-10: 40 mg via INTRAMUSCULAR

## 2013-02-10 MED ORDER — HYDROCODONE-ACETAMINOPHEN 5-325 MG PO TABS: ORAL_TABLET | ORAL | Status: DC

## 2013-02-10 NOTE — ED Provider Notes (Signed)
CSN: [id number]     Arrival date & time 02/10/13  1957 History     First MD Initiated Contact with Patient 02/10/13 2004     Chief Complaint  Patient presents with  . Insect Bite      HPI Comments: Patient was stung by approximately 12 yellow jackets while mowing today.  She took Benadryl 50mg  rather immediately.  She denies wheezing, tightness in chest or throat, difficulty swallowing, or significant swelling.  Her Tdap is current.  Patient is a 57 y.o. female presenting with trauma. The history is provided by the patient.  Trauma Mechanism of injury: yellow jacket stings Injury location: arms, hands, legs, including right inner thigh. Incident location: home Time since incident: 3 hours   Current symptoms:      Pain quality: burning      Pain timing: constant      Associated symptoms:            Denies abdominal pain, chest pain, difficulty breathing, headache, nausea and neck pain.   Relevant PMH:      Medical risk factors:            No asthma.       Tetanus status: UTD   Past Medical History  Diagnosis Date  . Complication of anesthesia   . PONV (postoperative nausea and vomiting)   . Hepatitis     hx hep b 1984  . Hypothyroidism   . Deviated septum   . Sinusitis, chronic   . Hyperlipemia    Past Surgical History  Procedure Laterality Date  . Herpes simplex virus dfa    . Anterior cruciate ligament repair  rt  . Tubal ligation    . Hand surgery      lt  . Nasal septoplasty w/ turbinoplasty  08/30/2011    Procedure: NASAL SEPTOPLASTY WITH TURBINATE REDUCTION;  Surgeon: Osborn Coho, MD;  Location: [facility];  Service: ENT;  Laterality: N/A;  nasal septoplasty, bil. inferior turbinated reduction, bil. endoscopic concha bullosa and anterior ethmoid and maxillary antrostomy, ethmoidectomy  . Nasal sinus surgery  08/30/2011    Procedure: ENDOSCOPIC SINUS SURGERY;  Surgeon: Osborn Coho, MD;  Location:  SURGERY CENTER;  Service: ENT;   Laterality: N/A;  . Knee surgery Left    Family History  Problem Relation Age of Onset  . Leukemia Mother   . Cancer Father     lung   History  Substance Use Topics  . Smoking status: Never Smoker   . Smokeless tobacco: Not on file  . Alcohol Use: No   OB History   Grav Para Term Preterm Abortions TAB SAB Ect Mult Living                 Review of Systems  HENT: Negative for neck pain.   Cardiovascular: Negative for chest pain.  Gastrointestinal: Negative for nausea and abdominal pain.  Neurological: Negative for headaches.  All other systems reviewed and are negative.    Allergies  Review of patient's allergies indicates no known allergies.  Home Medications   Current Outpatient Rx  Name  Route  Sig  Dispense  Refill  . HYDROcodone-acetaminophen (NORCO/VICODIN) 5-325 MG per tablet      Take one by mouth at bedtime as needed for pain   8 tablet   0   . levothyroxine (SYNTHROID, LEVOTHROID) 75 MCG tablet   Oral   Take 75 mcg by mouth daily.         Marland Kitchen  valACYclovir (VALTREX) 500 MG tablet   Oral   Take 500 mg by mouth as needed.         . zolpidem (AMBIEN) 10 MG tablet   Oral   Take 10 mg by mouth as needed.          BP 151/84  Pulse 115  Temp(Src) 97.9 F (36.6 C) (Oral)  Resp 18  Wt 185 lb (83.915 kg)  BMI 29.87 kg/m2  SpO2 98% Physical Exam Nursing notes and Vital Signs reviewed. Appearance:  Patient appears healthy, stated age, and in no acute distress.  She appears alert and comfortable Eyes:  Pupils are equal, round, and reactive to light and accomodation.  Extraocular movement is intact.  Conjunctivae are not inflamed   Pharynx:  Normal Neck:  Supple.  No adenopathy Lungs:  Clear to auscultation.  Breath sounds are equal.  Heart:  Regular rate and rhythm without murmurs, rubs, or gallops.  Abdomen:  Nontender without masses or hepatosplenomegaly.  Bowel sounds are present.  No CVA or flank tenderness.  Extremities:  No edema.  No calf  tenderness Skin:   Mildly erythematous patches at several sites on arms and legs.  Mild swelling right dorsal wrist.   ED Course   Procedures  none    1. Insect stings, initial encounter; local reaction only     MDM  Kenalog 40mg  IM Take Benadryl 50mg  every 6 hours until improving.  Apply ice pack several times daily.  Return for signs of infection.  Lattie Haw, MD 02/11/13 [phone]

## 2013-02-10 NOTE — ED Notes (Signed)
Pt reports that she had 12 yellow jacket stings at 1700 today. She c/o pain and swelling at the site of the bites. Denies SOB.

## 2013-02-12 ENCOUNTER — Telehealth: Payer: Self-pay | Admitting: *Deleted

## 2013-12-09 ENCOUNTER — Other Ambulatory Visit: Payer: Self-pay

## 2013-12-09 DIAGNOSIS — Z1231 Encounter for screening mammogram for malignant neoplasm of breast: Secondary | ICD-10-CM

## 2013-12-14 ENCOUNTER — Other Ambulatory Visit (INDEPENDENT_AMBULATORY_CARE_PROVIDER_SITE_OTHER): Payer: 59

## 2013-12-14 ENCOUNTER — Encounter: Payer: Self-pay | Admitting: Family Medicine

## 2013-12-14 ENCOUNTER — Ambulatory Visit (INDEPENDENT_AMBULATORY_CARE_PROVIDER_SITE_OTHER): Payer: 59 | Admitting: Family Medicine

## 2013-12-14 VITALS — BP 128/84 | HR 75 | Ht 66.0 in | Wt 186.0 lb

## 2013-12-14 DIAGNOSIS — M76829 Posterior tibial tendinitis, unspecified leg: Secondary | ICD-10-CM | POA: Insufficient documentation

## 2013-12-14 DIAGNOSIS — M84469D Pathological fracture, unspecified tibia and fibula, subsequent encounter for fracture with routine healing: Secondary | ICD-10-CM

## 2013-12-14 DIAGNOSIS — M25579 Pain in unspecified ankle and joints of unspecified foot: Secondary | ICD-10-CM

## 2013-12-14 DIAGNOSIS — M84376G Stress fracture, unspecified foot, subsequent encounter for fracture with delayed healing: Secondary | ICD-10-CM | POA: Insufficient documentation

## 2013-12-14 DIAGNOSIS — M654 Radial styloid tenosynovitis [de Quervain]: Secondary | ICD-10-CM | POA: Insufficient documentation

## 2013-12-14 NOTE — Patient Instructions (Addendum)
It is great to see you! Ice bath 20 minutes 2 times a day Wear boot daily for next 3 weeks.  Tylenol 650 mg three times a day Vitamin D add 1000 IU daily.  Turmeric 500mg  twice daily to decrease inflammation.  Exercises 3 times a week for ankle and for thumb Wear splint day and night for 2 weeks then nightly for 2 weeks.  Voltaren gel twice daily [employer] [facility] at [employer] or online.  Come back in 3 weeks.  Posterior Tibial Tendon Tendinitis with Rehab Tendonitis is a condition that is characterized by inflammation of a tendon or the lining (sheath) that surrounds it. The inflammation is usually caused by damage to the tendon, such as a tendon tear (strain). Sprains are classified into three categories. Grade 1 sprains cause pain, but the tendon is not lengthened. Grade 2 sprains include a lengthened ligament due to the ligament being stretched or partially ruptured. With grade 2 sprains there is still function, although the function may be diminished. Grade 3 sprains are characterized by a complete tear of the tendon or muscle, and function is usually impaired. Posterior tibialis tendonitis is tendonitis of the posterior tibial tendon, which attaches muscles of the lower leg to the foot. The posterior tibial tendon is located in the back of the ankle and helps the body straighten (plantarflex) and rotate inward (medially rotate) the ankle. SYMPTOMS   Pain, tenderness, swelling, warmth, and/or redness over the back of the inner ankle at the posterior tibial tendon or the inner part of the mid-foot.  Pain that worsens with plantarflexion or medial rotation of the ankle.  A crackling sound (crepitation) when the tendon is moved or touched. CAUSES  Posterior tibial tendonitis occurs when damage to the posterior tibial tendon starts an inflammatory response. Common mechanisms of injury include:  Degenerative (occurs with aging) processes that weaken the tendon and make it more susceptible  to injury.  Stress placed on the tendon from an increase in the intensity, frequency, or duration of training.  Direct trauma to the ankle.  Returning to activity before a previous ankle injury is allowed to heal. RISK INCREASES WITH:  Activities that involve repetitive and/or stressful plantarflexion (jumping, kicking, or running up/down hills).  Poor strength and flexibility.  Flat feet.  Previous injury to the foot, ankle, or leg. PREVENTION   Warm up and stretch properly before activity.  Allow for adequate recovery between workouts.  Maintain physical fitness:  Strength, flexibility, and endurance.  Cardiovascular fitness.  Learn and use proper technique. When possible, have a coach correct improper technique.  Complete rehabilitation from a previous foot, ankle, or leg injury.  If you have flat feet, wear arch supports (orthotics). PROGNOSIS  If treated properly, then the symptoms of tendonitis usually resolve within 6 weeks. This period may be shorter for injuries caused by direct trauma. RELATED COMPLICATIONS   Prolonged healing time, if improperly treated or re-injured.  Recurrent symptoms that result in a chronic problem.  Partial or complete tendon tear (rupture) requiring surgery. TREATMENT  Treatment initially involves the use of ice and medication to help reduce pain and inflammation. The use of strengthening and stretching exercises may help reduce pain with activity. These exercises may be performed at home or with referral to a therapist. Often times, your caregiver will recommend immobilizing the ankle to allow the tendon to heal. If you have flat feet, the you may be advised to wear orthotic arch supports. If symptoms persist for greater than 6  months despite non-surgical (conservative) treatment, then surgery may be recommended. MEDICATION   If pain medication is necessary, then nonsteroidal anti-inflammatory medications, such as aspirin and  ibuprofen, or other minor pain relievers, such as acetaminophen, are often recommended.  Do not take pain medication for 7 days before surgery.  Prescription pain relievers may be given if deemed necessary by your caregiver. Use only as directed and only as much as you need.  Corticosteroid injections may be given by your caregiver. These injections should be reserved for the most serious cases, because they may only be given a certain number of times. HEAT AND COLD  Cold treatment (icing) relieves pain and reduces inflammation. Cold treatment should be applied for 10 to 15 minutes every 2 to 3 hours for inflammation and pain and immediately after any activity that aggravates your symptoms. Use ice packs or massage the area with a piece of ice (ice massage).  Heat treatment may be used prior to performing the stretching and strengthening activities prescribed by your caregiver, physical therapist, or athletic trainer. Use a heat pack or soak the injury in warm water. SEEK MEDICAL CARE IF:  Treatment seems to offer no benefit, or the condition worsens.  Any medications produce adverse side effects. EXERCISES RANGE OF MOTION (ROM) AND STRETCHING EXERCISES - Posterior Tibial Tendon Tendinitis These exercises may help you when beginning to rehabilitate your injury. Your symptoms may resolve with or without further involvement from your physician, physical therapist or athletic trainer. While completing these exercises, remember:   Restoring tissue flexibility helps normal motion to return to the joints. This allows healthier, less painful movement and activity.  An effective stretch should be held for at least 30 seconds.  A stretch should never be painful. You should only feel a gentle lengthening or release in the stretched tissue. RANGE OF MOTION - Ankle Plantar Flexion   Sit with your right / left leg crossed over your opposite knee.  Use your opposite hand to pull the top of your foot  and toes toward you.  You should feel a gentle stretch on the top of your foot/ankle. Hold this position for __________ seconds. Repeat __________ times. Complete this exercise __________ times per day.  RANGE OF MOTION - Ankle Eversion   Sit with your right / left ankle crossed over your opposite knee.  Grip your foot with your opposite hand, placing your thumb on the top of your foot and your fingers across the bottom of your foot.  Gently push your foot downward with a slight rotation so your littlest toes rise slightly  You should feel a gentle stretch on the inside of your ankle. Hold the stretch for __________ seconds. Repeat __________ times. Complete this exercise __________ times per day.  RANGE OF MOTION - Ankle Inversion   Sit with your right / left ankle crossed over your opposite knee.  Grip your foot with your opposite hand, placing your thumb on the bottom of your foot and your fingers across the top of your foot.  Gently pull your foot so the smallest toe comes toward you and your thumb pushes the inside of the ball of your foot away from you.  You should feel a gentle stretch on the outside of your ankle. Hold the stretch for __________ seconds. Repeat __________ times. Complete this exercise __________ times per day.  RANGE OF MOTION - Dorsi/Plantar Flexion  While sitting with your right / left knee straight, draw the top of your foot upwards by  flexing your ankle. Then reverse the motion, pointing your toes downward.  Hold each position for __________ seconds.  After completing your first set of exercises, repeat this exercise with your knee bent. Repeat __________ times. Complete this exercise __________ times per day.  RANGE OF MOTION - Ankle Alphabet  Imagine your right / left big toe is a pen.  Keeping your hip and knee still, write out the entire alphabet with your "pen." Make the letters as large as you can without increasing any discomfort. Repeat  __________ times. Complete this exercise __________ times per day.  STRETCH - Gastrocsoleus   Sit with your right / left leg extended. Holding onto both ends of a belt or towel, loop it around the ball of your foot.  Keeping your right / left ankle and foot relaxed and your knee straight, pull your foot and ankle toward you using the belt/towel.  You should feel a gentle stretch behind your calf or knee. Hold this position for __________ seconds. Repeat __________ times. Complete this exercise __________ times per day.  STRETCH  Gastroc, Standing   Place hands on wall.  Extend right / left leg, keeping the front knee somewhat bent.  Slightly point your toes inward on your back foot.  Keeping your right / left heel on the floor and your knee straight, shift your weight toward the wall, not allowing your back to arch.  You should feel a gentle stretch in the right / left calf. Hold this position for __________ seconds. Repeat __________ times. Complete this stretch __________ times per day. STRETCH  Soleus, Standing   Place hands on wall.  Extend right / left leg, keeping the other knee somewhat bent.  Slightly point your toes inward on your back foot.  Keep your right / left heel on the floor, bend your back knee, and slightly shift your weight over the back leg so that you feel a gentle stretch deep in your back calf.  Hold this position for __________ seconds. Repeat __________ times. Complete this stretch __________ times per day. STRENGTHENING EXERCISES - Posterior Tibial Tendon Tendinitis These exercises may help you when beginning to rehabilitate your injury. They may resolve your symptoms with or without further involvement from your physician, physical therapist or athletic trainer. While completing these exercises, remember:   Muscles can gain both the endurance and the strength needed for everyday activities through controlled exercises.  Complete these exercises as  instructed by your physician, physical therapist or athletic trainer. Progress the resistance and repetitions only as guided. STRENGTH - Dorsiflexors  Secure a rubber exercise band/tubing to a fixed object (ie. table, pole) and loop the other end around your right / left foot.  Sit on the floor facing the fixed object. The band/tubing should be slightly tense when your foot is relaxed.  Slowly draw your foot back toward you using your ankle and toes.  Hold this position for __________ seconds. Slowly release the tension in the band and return your foot to the starting position. Repeat __________ times. Complete this exercise __________ times per day.  STRENGTH - Towel Curls  Sit in a chair positioned on a non-carpeted surface.  Place your foot on a towel, keeping your heel on the floor.  Pull the towel toward your heel by only curling your toes. Keep your heel on the floor.  If instructed by your physician, physical therapist or athletic trainer, add ____________________ at the end of the towel. Repeat __________ times. Complete this exercise __________  times per day. STRENGTH - Ankle Eversion   Secure one end of a rubber exercise band/tubing to a fixed object (table, pole). Loop the other end around your foot just before your toes.  Place your fists between your knees. This will focus your strengthening at your ankle.  Drawing the band/tubing across your opposite foot, slowly, pull your little toe out and up. Make sure the band/tubing is positioned to resist the entire motion.  Hold this position for __________ seconds.  Have your muscles resist the band/tubing as it slowly pulls your foot back to the starting position. Repeat __________ times. Complete this exercise __________ times per day.  STRENGTH - Ankle Inversion   Secure one end of a rubber exercise band/tubing to a fixed object (table, pole). Loop the other end around your foot just before your toes.  Place your fists  between your knees. This will focus your strengthening at your ankle.  Slowly, pull your big toe up and in, making sure the band/tubing is positioned to resist the entire motion.  Hold this position for __________ seconds.  Have your muscles resist the band/tubing as it slowly pulls your foot back to the starting position. Repeat __________ times. Complete this exercises __________ times per day.  Document Released: 06/17/2005 Document Revised: 09/09/2011 Document Reviewed: 09/29/2008 [facility] Patient Information 2014 [employer], [place].

## 2013-12-14 NOTE — Assessment & Plan Note (Signed)
Patient does have some inflammation and a positive Finkelstein. Patient was given a thumb spica cast to wear daily and night for the next 2 weeks then nightly for another 2 weeks. We discussed icing and topical anti-inflammatories. Patient was given home exercise program she'll do for the next 3 weeks. Patient come back in 3 weeks for further evaluation.

## 2013-12-14 NOTE — Progress Notes (Signed)
[name] [place], [place] [address] Phone: [phone] Subjective:     CC: Right ankle and right wrist pain IOX:[id number] Sherry [name] is a 58 y.o. female coming in with complaint of right ankle pain as well as right wrist pain.  Right ankle pain patient states started multiple months ago. Patient was seen another orthopedic provider and was diagnosed with posterior tibialis tendinitis. Patient was getting somewhat better with topical anti-inflammatories and one burst of steroids but now the pain seems to be worsening. Patient states that she is unable to work a full day without severe pain and having to take a break. Patient has been icing which has been helpful. Denies any numbness or tingling. Patient states initially she did have swelling over the medial aspect of her ankle but that seems to have improved. Patient like to be pain-free and is wondering what is occurring. Patient states that the pain today is 0/10 but can be as severe as 9/10 after a work day. Patient states that she can of a throbbing sensation at night.  Right wrist pain. Patient does have a history of a de Quervain's tenosynovitis and states that it feels that it is flaring. Patient has been doing more repetitive motions at work and been using her hands in reaching more because she does not feel like locking secondary to her ankle pain. Denies any radiation along down the finger or any numbness. Patient describes it is more of a sharp pain that occurs from time to time. Patient did improve previously with conservative therapy she states. Pain is 5/10 in severity. Has not tried any other modalities at this time. Patient has to avoid anti-inflammatories orally secondary to a history of gastroesophageal reflux disease     Past medical history, social, surgical and family history all reviewed in electronic medical record.   Review of Systems: No headache, visual changes,  nausea, vomiting, diarrhea, constipation, dizziness, abdominal pain, skin rash, fevers, chills, night sweats, weight loss, swollen lymph nodes, body aches, joint swelling, muscle aches, chest pain, shortness of breath, mood changes.   Objective Blood pressure 128/84, pulse 75, height 5\' 6"  (1.676 m), weight 186 lb (84.369 kg), SpO2 98.00%.  General: No apparent distress alert and oriented x3 mood and affect normal, dressed appropriately.  HEENT: Pupils equal, extraocular movements intact  Respiratory: Patient's speak in full sentences and does not appear short of breath  Cardiovascular: No lower extremity edema, non tender, no erythema  Skin: Warm dry intact with no signs of infection or rash on extremities or on axial skeleton.  Abdomen: Soft nontender  Neuro: Cranial nerves II through XII are intact, neurovascularly intact in all extremities with 2+ DTRs and 2+ pulses.  Lymph: No lymphadenopathy of posterior or anterior cervical chain or axillae bilaterally.  Gait normal with good balance and coordination.  MSK:  Non tender with full range of motion and good stability and symmetric strength and tone of shoulders, elbows, wrist, hip, knees bilaterally.  Ankle: Right Trace swelling over the navicular prominence Range of motion is full in all directions. Strength is 5/5 in all directions. Stable lateral and medial ligaments; squeeze test and kleiger test unremarkable; Talar dome nontender; No pain at base of 5th MT; No tenderness over cuboid; Patient is tender to palpation over the navicular prominences and the posterior tibialis area. No tenderness on posterior aspects of lateral and medial malleolus No sign of peroneal tendon subluxations or tenderness to palpation Negative tarsal  tunnel tinel's Able to walk 4 steps. Contralateral ankle unremarkable Foot exam shows the patient has severe over pronation of the hindfoot bilaterally.  Wrist: Right Inspection normal with no visible  erythema or swelling. ROM smooth and normal with good flexion and extension and ulnar/radial deviation that is symmetrical with opposite wrist. Palpation is normal over metacarpals, navicular, lunate, and TFCC; tendons without tenderness/ swelling No snuffbox tenderness. No tenderness over Canal of Guyon. Strength 5/5 in all directions without pain. Positive Finkelstein, negative tinel's and phalens. Negative Watson's test.   MSK US performed of: Right ankle This study was ordered, performed, and interpreted by Charlann Boxer D.O.  Foot/Ankle:   All structures visualized.   Talar dome unremarkable  Ankle mortise without effusion. Peroneus longus and brevis tendons unremarkable on long and transverse views without sheath effusions. Posterior tibialis does have significant hypoechoic changes within the tendon sheath. No tear appreciated. Flexor hallucis longus, and flexor digitorum longus tendons unremarkable on long and transverse views without sheath effusions. Patient's navicular bone does appear to have significant thickening over the prominence. There is increasing Doppler flow in this area. Achilles tendon visualized along length of tendon and unremarkable on long and transverse views without sheath effusion. Anterior Talofibular Ligament and Calcaneofibular Ligaments unremarkable and intact. Deltoid Ligament unremarkable and intact. Plantar fascia intact and without effusion, normal thickness. No increased doppler signal, cap sign, or thickening of tibial cortex. Power doppler signal normal.  IMPRESSION:   Healing stress reaction of the navicular prominence as well as posterior tibialis tendinitis      Impression and Recommendations:     This case required medical decision making of moderate complexity.

## 2013-12-14 NOTE — Assessment & Plan Note (Signed)
I think patient was having bad enough posterior tibialis as well as the over pronation of the foot but this caused too much strain on the navicular prominence causing a stress reaction. Patient is already having a callus formation which does show good healing the patient has not been able to decrease her activity. Patient was put in a Cam Walker at this time. We discussed icing procedure and over-the-counter medications avoiding any anti-inflammatories or steroid at this juncture until bone is completely healed. Patient will come back again in 3 weeks for further evaluation.

## 2013-12-14 NOTE — Assessment & Plan Note (Signed)
Patient does have some posterior tibialis tendinitis that is secondary to the over pronation of the hindfoot. Patient will wear a boot though secondary to the navicular stress fracture that is also noted. Patient will increase her vitamin D levels as well as do icing protocol. Patient will try these interventions and come back again in 3 weeks for further evaluation.

## 2013-12-15 ENCOUNTER — Telehealth: Payer: Self-pay

## 2013-12-15 ENCOUNTER — Encounter: Payer: Self-pay | Admitting: *Deleted

## 2013-12-15 NOTE — Telephone Encounter (Signed)
Faxed

## 2013-12-15 NOTE — Telephone Encounter (Signed)
Pt would like a doctors note to be faxed back to her job stating that she is able to go back to work w/o restriction and then faxed to [phone]. Please advise

## 2013-12-15 NOTE — Telephone Encounter (Signed)
Will do!

## 2013-12-23 ENCOUNTER — Ambulatory Visit
Admission: RE | Admit: 2013-12-23 | Discharge: 2013-12-23 | Disposition: A | Discharge status: Home / Self Care | Payer: 59 | Source: Ambulatory Visit

## 2013-12-23 DIAGNOSIS — Z1231 Encounter for screening mammogram for malignant neoplasm of breast: Secondary | ICD-10-CM

## 2014-01-05 ENCOUNTER — Ambulatory Visit (INDEPENDENT_AMBULATORY_CARE_PROVIDER_SITE_OTHER): Payer: 59 | Admitting: Family Medicine

## 2014-01-05 ENCOUNTER — Encounter: Payer: Self-pay | Admitting: Family Medicine

## 2014-01-05 ENCOUNTER — Other Ambulatory Visit (INDEPENDENT_AMBULATORY_CARE_PROVIDER_SITE_OTHER): Payer: 59

## 2014-01-05 VITALS — BP 114/80 | HR 66 | Ht 66.0 in | Wt 184.0 lb

## 2014-01-05 DIAGNOSIS — M84374D Stress fracture, right foot, subsequent encounter for fracture with routine healing: Secondary | ICD-10-CM

## 2014-01-05 DIAGNOSIS — Z4789 Encounter for other orthopedic aftercare: Secondary | ICD-10-CM

## 2014-01-05 DIAGNOSIS — M8448XD Pathological fracture, other site, subsequent encounter for fracture with routine healing: Secondary | ICD-10-CM

## 2014-01-05 DIAGNOSIS — M76822 Posterior tibial tendinitis, left leg: Secondary | ICD-10-CM

## 2014-01-05 DIAGNOSIS — M654 Radial styloid tenosynovitis [de Quervain]: Secondary | ICD-10-CM

## 2014-01-05 DIAGNOSIS — M84374G Stress fracture, right foot, subsequent encounter for fracture with delayed healing: Secondary | ICD-10-CM

## 2014-01-05 DIAGNOSIS — M76829 Posterior tibial tendinitis, unspecified leg: Secondary | ICD-10-CM

## 2014-01-05 NOTE — Progress Notes (Signed)
[name] [place], [place] [address] Phone: [phone] Subjective:     CC: Right ankle and right wrist pain follow up [id number] Sherry [name] is a 58 y.o. female coming in with complaint of right ankle pain as well as right wrist pain.  Right ankle pain.  Patient was seen previously and was diagnosed with a navicular stress fracture as well as a posterior tibialis tendinitis. Patient the Cam Walker and given home exercise program we discussed icing and anti-inflammatories. We discussed over-the-counter medications and vitamin D supplementation previously. Patient states she has been doing vitamin D a double dosing. Patient continues to wear the Cam Walker and has noted significant improvement about 60-70%. Patient actually is not having any pain. Patient states at the end of a long day at work she has some mild discomfort but states that she has no pain at night. Overall she does think it is getting better. Denies any new symptoms. Has been icing fairly regularly as well.  Right wrist pain. Patient was diagnosed with a de Quervain's tenosynovitis. Patient was given a brace to wear daily and night for 2 weeks and nightly for 2 weeks. Patient was to do icing and was given home exercise program. Patient states overall her wrist is completely resolved but she is having some mild elbow pain. This is very minimal.     Past medical history, social, surgical and family history all reviewed in electronic medical record.   Review of Systems: No headache, visual changes, nausea, vomiting, diarrhea, constipation, dizziness, abdominal pain, skin rash, fevers, chills, night sweats, weight loss, swollen lymph nodes, body aches, joint swelling, muscle aches, chest pain, shortness of breath, mood changes.   Objective Blood pressure 114/80, pulse 66, height 5\' 6"  (1.676 m), weight 184 lb (83.462 kg), SpO2 97.00%.  General: No apparent distress alert and  oriented x3 mood and affect normal, dressed appropriately.  HEENT: Pupils equal, extraocular movements intact  Respiratory: Patient's speak in full sentences and does not appear short of breath  Cardiovascular: No lower extremity edema, non tender, no erythema  Skin: Warm dry intact with no signs of infection or rash on extremities or on axial skeleton.  Abdomen: Soft nontender  Neuro: Cranial nerves II through XII are intact, neurovascularly intact in all extremities with 2+ DTRs and 2+ pulses.  Lymph: No lymphadenopathy of posterior or anterior cervical chain or axillae bilaterally.  Gait normal with good balance and coordination.  MSK:  Non tender with full range of motion and good stability and symmetric strength and tone of shoulders, elbows, wrist, hip, knees bilaterally.  Ankle: Right No swelling noted Range of motion is full in all directions. Strength is 5/5 in all directions. Stable lateral and medial ligaments; squeeze test and kleiger test unremarkable; Talar dome nontender; No pain at base of 5th MT; No tenderness over cuboid; Nontender over the navicular prominence No tenderness on posterior aspects of lateral and medial malleolus No sign of peroneal tendon subluxations or tenderness to palpation Negative tarsal tunnel tinel's Able to walk 4 steps. Contralateral ankle unremarkable Foot exam shows the patient has severe over pronation of the hindfoot bilaterally.  Wrist: Right Inspection normal with no visible erythema or swelling. ROM smooth and normal with good flexion and extension and ulnar/radial deviation that is symmetrical with opposite wrist. Palpation is normal over metacarpals, navicular, lunate, and TFCC; tendons without tenderness/ swelling No snuffbox tenderness. No tenderness over Canal of [place]. Strength 5/5 in all  directions without pain. Negative Finkelstein, negative tinel's and phalens. Negative Watson's test. Contralateral wrist is  unremarkable  MSK US performed of: Right ankle This study was ordered, performed, and interpreted by Charlann Boxer D.O.  Foot/Ankle:   All structures visualized.   Talar dome unremarkable  Ankle mortise without effusion. Peroneus longus and brevis tendons unremarkable on long and transverse views without sheath effusions. Posterior tibialis with less hypoechoic changes and previous. No tear appreciated. Flexor hallucis longus, and flexor digitorum longus tendons unremarkable on long and transverse views without sheath effusions. Patient's navicular bone does appear to have significant thickening over the prominence with good callus formation. There is increasing Doppler flow in this area. Achilles tendon visualized along length of tendon and unremarkable on long and transverse views without sheath effusion. Anterior Talofibular Ligament and Calcaneofibular Ligaments unremarkable and intact. Deltoid Ligament unremarkable and intact. Plantar fascia intact and without effusion, normal thickness. No increased doppler signal, cap sign, or thickening of tibial cortex. Power doppler signal normal.  IMPRESSION:   Healing stress reaction of the navicular prominence as well as posterior tibialis tendinitis      Impression and Recommendations:     This case required medical decision making of moderate complexity.

## 2014-01-05 NOTE — Patient Instructions (Signed)
It is good to see you.  Ice is still your best friend.  Conitnue the exercises 3 times a week.  For the ankle continue the boot until after [place], you are doing great! Exercises for the ankle still 3 times a week at least.  Decrease Vitamin D to 5000 IU daily.  Conitnue icing at end of the day.  For the elbow look at handout and try to do exercises 3 times a week.  See me again in 3-4 weeks.

## 2014-01-05 NOTE — Assessment & Plan Note (Addendum)
Patient is actually showing callus formation on ultrasound today. Patient likely is going to heal very well. We'll continue the boot for another 3 weeks. After that we'll get patient to start doing more regular walking in regular shoes. Patient will continue the exercises for range of motion of the ankle as well. We discussed continuing the icing regimen. Patient and will come back after her trip and we'll discuss further care.

## 2014-01-05 NOTE — Assessment & Plan Note (Signed)
Completely resolved at this time. 

## 2014-01-05 NOTE — Assessment & Plan Note (Signed)
Seems to be resolving very well at this time as well. Patient will continue with range of motion exercises as well as icing. Patient come back and see [place] again after tripper for further evaluation. In a like to do another ultrasound at that time.

## 2014-02-01 ENCOUNTER — Ambulatory Visit (INDEPENDENT_AMBULATORY_CARE_PROVIDER_SITE_OTHER): Payer: 59 | Admitting: Family Medicine

## 2014-02-01 ENCOUNTER — Other Ambulatory Visit (INDEPENDENT_AMBULATORY_CARE_PROVIDER_SITE_OTHER): Payer: 59

## 2014-02-01 ENCOUNTER — Encounter: Payer: Self-pay | Admitting: Family Medicine

## 2014-02-01 VITALS — BP 112/78 | HR 70 | Ht 66.0 in | Wt 183.0 lb

## 2014-02-01 DIAGNOSIS — M79671 Pain in right foot: Secondary | ICD-10-CM

## 2014-02-01 DIAGNOSIS — M79609 Pain in unspecified limb: Secondary | ICD-10-CM

## 2014-02-01 DIAGNOSIS — M76829 Posterior tibial tendinitis, unspecified leg: Secondary | ICD-10-CM

## 2014-02-01 DIAGNOSIS — M76822 Posterior tibial tendinitis, left leg: Secondary | ICD-10-CM

## 2014-02-01 MED ORDER — NITROGLYCERIN 0.2 MG/HR TD PT24: MEDICATED_PATCH | TRANSDERMAL | Status: DC

## 2014-02-01 NOTE — Assessment & Plan Note (Signed)
navicular bone is almost near healed. Discuss continue with vitamin D.  Patient will start to transition into her shoes. His lungs patient can tolerate I will continue to progress her activity. Patient has worsening pain though I would consider further imaging including an MRI to further evaluate.

## 2014-02-01 NOTE — Progress Notes (Signed)
[name] [address], [place] [address] Phone: [phone] Subjective:     CC: Right ankle and right wrist pain follow up [id number] Sherry [name] is a 58 y.o. female coming in with complaint of right ankle pain followup  Right ankle pain.  Patient was seen previously and was diagnosed with a navicular stress fracture as well as a posterior tibialis tendinitis. Patient the Cam Walker and given home exercise program we discussed icing and anti-inflammatories. We discussed over-the-counter medications and vitamin D supplementation previously.  Patient is now been in a [employer] for a total of 6 weeks. Overall she continues to do better and states that during her daily activities she does not notice it until the end of the day. Patient states most of pain is more on the posterior aspect of the medial malleolus and not as much over the bone. Patient feels that she is doing better overall and put herself at 85%. Denies any new symptoms.    Past medical history, social, surgical and family history all reviewed in electronic medical record.   Review of Systems: No headache, visual changes, nausea, vomiting, diarrhea, constipation, dizziness, abdominal pain, skin rash, fevers, chills, night sweats, weight loss, swollen lymph nodes, body aches, joint swelling, muscle aches, chest pain, shortness of breath, mood changes.   Objective Blood pressure 112/78, pulse 70, height 5\' 6"  (1.676 m), weight 183 lb (83.008 kg), SpO2 97.00%.  General: No apparent distress alert and oriented x3 mood and affect normal, dressed appropriately.  HEENT: Pupils equal, extraocular movements intact  Respiratory: Patient's speak in full sentences and does not appear short of breath  Cardiovascular: No lower extremity edema, non tender, no erythema  Skin: Warm dry intact with no signs of infection or rash on extremities or on axial skeleton.  Abdomen: Soft nontender    Neuro: Cranial nerves II through XII are intact, neurovascularly intact in all extremities with 2+ DTRs and 2+ pulses.  Lymph: No lymphadenopathy of posterior or anterior cervical chain or axillae bilaterally.  Gait normal with good balance and coordination.  MSK:  Non tender with full range of motion and good stability and symmetric strength and tone of shoulders, elbows, wrist, hip, knees bilaterally.  Ankle: Right No swelling noted Range of motion is full in all directions. Strength is 5/5 in all directions. Stable lateral and medial ligaments; squeeze test and kleiger test unremarkable; Talar dome nontender; No pain at base of 5th MT; No tenderness over cuboid; Nontender over the navicular prominence No tenderness on posterior aspects of lateral and medial malleolus No sign of peroneal tendon subluxations or tenderness to palpation Negative tarsal tunnel tinel's Able to walk 4 steps. Contralateral ankle unremarkable Foot exam shows the patient has severe over pronation of the hindfoot bilaterally.   MSK US performed of: Right ankle This study was ordered, performed, and interpreted by Charlann Boxer D.O.  Foot/Ankle:   All structures visualized.   Talar dome unremarkable  Ankle mortise without effusion. Peroneus longus and brevis tendons unremarkable on long and transverse views without sheath effusions. Posterior tibialis continues to have some hypoechoic changes within the tendon sheath Flexor hallucis longus, and flexor digitorum longus tendons unremarkable on long and transverse views without sheath effusions. Patient's navicular bone does appear to have significant thickening over the prominence with good callus formation. Still not completely healed very mild cortical defect still noted. Achilles tendon visualized along length of tendon and unremarkable on long and transverse views  without sheath effusion. Anterior Talofibular Ligament and Calcaneofibular Ligaments unremarkable  and intact. Deltoid Ligament unremarkable and intact. Plantar fascia intact and without effusion, normal thickness. No increased doppler signal, cap sign, or thickening of tibial cortex. Power doppler signal normal.  IMPRESSION:   Healing stress reaction of the navicular prominence as well as posterior tibialis tendinitis still present      Impression and Recommendations:     This case required medical decision making of moderate complexity.

## 2014-02-01 NOTE — Assessment & Plan Note (Signed)
Patient continues to have some tendinitis we are going to try a topical nitroglycerin patch. We discussed continuing the icing a regular regimen. Patient was transitioned into a regular shoe at this time we will she how she does well. Patient will continue with her regular duties at work. Patient come back in 3 weeks. If continuing to have pain though I would consider further imaging including CT versus MRI of the ankle.

## 2014-02-01 NOTE — Patient Instructions (Signed)
Good to see you Continue icing when you can Change to a shoe for when at home and small errands. Work still Dealer for 2 weeks  Pennsaid twice daily.  Try new exercises 3 times a week.  Nitroglycerin Protocol   Apply 1/4 nitroglycerin patch to affected area daily.  Change position of patch within the affected area every 24 hours.  You may experience a headache during the first 1-2 weeks of using the patch, these should subside.  If you experience headaches after beginning nitroglycerin patch treatment, you may take your preferred over the counter pain reliever.  Another side effect of the nitroglycerin patch is skin irritation or rash related to patch adhesive.  Please notify our office if you develop more severe headaches or rash, and stop the patch.  Tendon healing with nitroglycerin patch may require 12 to 24 weeks depending on the extent of injury.  Men should not use if taking Viagra, Cialis, or Levitra.   Do not use if you have migraines or rosacea.   Come back in 3 weeks.   Posterior Tibial Tendon Tendinitis with Rehab Tendonitis is a condition that is characterized by inflammation of a tendon or the lining (sheath) that surrounds it. The inflammation is usually caused by damage to the tendon, such as a tendon tear (strain). Sprains are classified into three categories. Grade 1 sprains cause pain, but the tendon is not lengthened. Grade 2 sprains include a lengthened ligament due to the ligament being stretched or partially ruptured. With grade 2 sprains there is still function, although the function may be diminished. Grade 3 sprains are characterized by a complete tear of the tendon or muscle, and function is usually impaired. Posterior tibialis tendonitis is tendonitis of the posterior tibial tendon, which attaches muscles of the lower leg to the foot. The posterior tibial tendon is located in the back of the ankle and helps the body straighten (plantar flex) and rotate  inward (medially rotate) the ankle. SYMPTOMS   Pain, tenderness, swelling, warmth, and/or redness over the back of the inner ankle at the posterior tibial tendon or the inner part of the mid-foot.  Pain that worsens with plantar flexion or medial rotation of the ankle.  A crackling sound (crepitation) when the tendon is moved or touched. CAUSES  Posterior tibial tendonitis occurs when damage to the posterior tibial tendon starts an inflammatory response. Common mechanisms of injury include:  Degenerative (occurs with aging) processes that weaken the tendon and make it more susceptible to injury.  Stress placed on the tendon from an increase in the intensity, frequency, or duration of training.  Direct trauma to the ankle.  Returning to activity before a previous ankle injury is allowed to heal. RISK INCREASES WITH:  Activities that involve repetitive and/or stressful plantar flexion (jumping, kicking, or running up/down hills).  Poor strength and flexibility.  Flat feet.  Previous injury to the foot, ankle, or leg. PREVENTION   Warm up and stretch properly before activity.  Allow for adequate recovery between workouts.  Maintain physical fitness:  Strength, flexibility, and endurance.  Cardiovascular fitness.  Learn and use proper technique. When possible, have a coach correct improper technique.  Complete rehabilitation from a previous foot, ankle, or leg injury.  If you have flat feet, wear arch supports (orthotics). PROGNOSIS  If treated properly, the symptoms of tendonitis usually resolve within 6 weeks. This period may be shorter for injuries caused by direct trauma. RELATED COMPLICATIONS   Prolonged healing time, if improperly  treated or reinjured.  Recurrent symptoms that result in a chronic problem.  Partial or complete tendon tear (rupture) requiring surgery. TREATMENT  Treatment initially involves the use of ice and medication to help reduce pain and  inflammation. The use of strengthening and stretching exercises may help reduce pain with activity. These exercises may be performed at home or with referral to a therapist. Often times, your caregiver will recommend immobilizing the ankle to allow the tendon to heal. If you have flat feet, you may be advised to wear orthotic arch supports. If symptoms persist for greater than 6 months despite nonsurgical (conservative) treatment, then surgery may be recommended. MEDICATION   If pain medication is necessary, then nonsteroidal anti-inflammatory medications, such as aspirin and ibuprofen, or other minor pain relievers, such as acetaminophen, are often recommended.  Do not take pain medication for 7 days before surgery.  Prescription pain relievers may be given if deemed necessary by your caregiver. Use only as directed and only as much as you need.  Corticosteroid injections may be given by your caregiver. These injections should be reserved for the most serious cases because they may only be given a certain number of times. HEAT AND COLD  Cold treatment (icing) relieves pain and reduces inflammation. Cold treatment should be applied for 10 to 15 minutes every 2 to 3 hours for inflammation and pain and immediately after any activity that aggravates your symptoms. Use ice packs or massage the area with a piece of ice (ice massage).  Heat treatment may be used prior to performing the stretching and strengthening activities prescribed by your caregiver, physical therapist, or athletic trainer. Use a heat pack or soak the injury in warm water. SEEK MEDICAL CARE IF:  Treatment seems to offer no benefit, or the condition worsens.  Any medications produce adverse side effects. EXERCISES RANGE OF MOTION (ROM) AND STRETCHING EXERCISES - Posterior Tibial Tendon Tendinitis These exercises may help you when beginning to rehabilitate your injury. Your symptoms may resolve with or without further involvement  from your physician, physical therapist or athletic trainer. While completing these exercises, remember:   Restoring tissue flexibility helps normal motion to return to the joints. This allows healthier, less painful movement and activity.  An effective stretch should be held for at least 30 seconds.  A stretch should never be painful. You should only feel a gentle lengthening or release in the stretched tissue. RANGE OF MOTION - Ankle Plantar Flexion   Sit with your right / left leg crossed over your opposite knee.  Use your opposite hand to pull the top of your foot and toes toward you.  You should feel a gentle stretch on the top of your foot/ankle. Hold this position for __________ seconds. Repeat __________ times. Complete this exercise __________ times per day.  RANGE OF MOTION - Ankle Eversion   Sit with your right / left ankle crossed over your opposite knee.  Grip your foot with your opposite hand, placing your thumb on the top of your foot and your fingers across the bottom of your foot.  Gently push your foot downward with a slight rotation so your littlest toes rise slightly.  You should feel a gentle stretch on the inside of your ankle. Hold the stretch for __________ seconds. Repeat __________ times. Complete this exercise __________ times per day.  RANGE OF MOTION - Ankle Inversion   Sit with your right / left ankle crossed over your opposite knee.  Grip your foot with your opposite  hand, placing your thumb on the bottom of your foot and your fingers across the top of your foot.  Gently pull your foot so the smallest toe comes toward you and your thumb pushes the inside of the ball of your foot away from you.  You should feel a gentle stretch on the outside of your ankle. Hold the stretch for __________ seconds. Repeat __________ times. Complete this exercise __________ times per day.  RANGE OF MOTION - Dorsi/Plantar Flexion  While sitting with your right / left  knee straight, draw the top of your foot upward by flexing your ankle. Then reverse the motion, pointing your toes downward.  Hold each position for __________ seconds.  After completing your first set of exercises, repeat this exercise with your knee bent. Repeat __________ times. Complete this exercise __________ times per day.  RANGE OF MOTION - Ankle Alphabet  Imagine your right / left big toe is a pen.  Keeping your hip and knee still, write out the entire alphabet with your "pen." Make the letters as large as you can without increasing any discomfort. Repeat __________ times. Complete this exercise __________ times per day.  STRETCH - Gastrocsoleus   Sit with your right / left leg extended. Holding onto both ends of a belt or towel, loop it around the ball of your foot.  Keeping your right / left ankle and foot relaxed and your knee straight, pull your foot and ankle toward you using the belt/towel.  You should feel a gentle stretch behind your calf or knee. Hold this position for __________ seconds. Repeat __________ times. Complete this exercise __________ times per day.  STRETCH - Gastroc, Standing   Place hands on wall.  Extend right / left leg, keeping the front knee somewhat bent.  Slightly point your toes inward on your back foot.  Keeping your right / left heel on the floor and your knee straight, shift your weight toward the wall, not allowing your back to arch.  You should feel a gentle stretch in the right / left calf. Hold this position for __________ seconds. Repeat __________ times. Complete this stretch __________ times per day. STRETCH - Soleus, Standing   Place hands on wall.  Extend right / left leg, keeping the other knee somewhat bent.  Slightly point your toes inward on your back foot.  Keep your right / left heel on the floor, bend your back knee, and slightly shift your weight over the back leg so that you feel a gentle stretch deep in your back  calf.  Hold this position for __________ seconds. Repeat __________ times. Complete this stretch __________ times per day. STRENGTHENING EXERCISES - Posterior Tibial Tendon Tendinitis These exercises may help you when beginning to rehabilitate your injury. They may resolve your symptoms with or without further involvement from your physician, physical therapist, or athletic trainer. While completing these exercises, remember:   Muscles can gain both the endurance and the strength needed for everyday activities through controlled exercises.  Complete these exercises as instructed by your physician, physical therapist, or athletic trainer. Progress the resistance and repetitions only as guided. STRENGTH - Dorsiflexors  Secure a rubber exercise band/tubing to a fixed object (i.e., table, pole) and loop the other end around your right / left foot.  Sit on the floor facing the fixed object. The band/tubing should be slightly tense when your foot is relaxed.  Slowly draw your foot back toward you using your ankle and toes.  Hold this position  for __________ seconds. Slowly release the tension in the band and return your foot to the starting position. Repeat __________ times. Complete this exercise __________ times per day.  STRENGTH - Towel Curls  Sit in a chair positioned on a non-carpeted surface.  Place your foot on a towel, keeping your heel on the floor.  Pull the towel toward your heel by only curling your toes. Keep your heel on the floor.  If instructed by your physician, physical therapist, or athletic trainer, add ____________________ at the end of the towel. Repeat __________ times. Complete this exercise __________ times per day. STRENGTH - Ankle Eversion   Secure one end of a rubber exercise band/tubing to a fixed object (table, pole). Loop the other end around your foot just before your toes.  Place your fists between your knees. This will focus your strengthening at your  ankle.  Drawing the band/tubing across your opposite foot, slowly pull your little toe out and up. Make sure the band/tubing is positioned to resist the entire motion.  Hold this position for __________ seconds.  Have your muscles resist the band/tubing as it slowly pulls your foot back to the starting position. Repeat __________ times. Complete this exercise __________ times per day.  STRENGTH - Ankle Inversion   Secure one end of a rubber exercise band/tubing to a fixed object (table, pole). Loop the other end around your foot just before your toes.  Place your fists between your knees. This will focus your strengthening at your ankle.  Slowly, pull your big toe up and in, making sure the band/tubing is positioned to resist the entire motion.  Hold this position for __________ seconds.  Have your muscles resist the band/tubing as it slowly pulls your foot back to the starting position. Repeat __________ times. Complete this exercises __________ times per day.  Document Released: 06/17/2005 Document Revised: 11/01/2013 Document Reviewed: 09/29/2008 [facility] Patient Information 2015 [place], [place]. This information is not intended to replace advice given to you by your health care provider. Make sure you discuss any questions you have with your health care provider.

## 2014-02-09 ENCOUNTER — Other Ambulatory Visit: Payer: Self-pay | Admitting: Family Medicine

## 2014-02-09 MED ORDER — DICLOFENAC SODIUM 2 % TD SOLN: 2.0000 "application " | Freq: Two times a day (BID) | TRANSDERMAL | Status: DC

## 2014-04-15 ENCOUNTER — Other Ambulatory Visit: Payer: Self-pay

## 2014-09-20 DIAGNOSIS — Z79899 Other long term (current) drug therapy: Secondary | ICD-10-CM | POA: Insufficient documentation

## 2015-01-07 ENCOUNTER — Encounter: Payer: 59 | Attending: Family Medicine | Admitting: Dietician

## 2015-01-07 DIAGNOSIS — Z713 Dietary counseling and surveillance: Secondary | ICD-10-CM | POA: Diagnosis not present

## 2015-01-07 NOTE — Progress Notes (Signed)
Patient was seen on 01/07/15 for the Weight Loss Class at the [facility]. The following learning objectives were met by the patient during this class:   Describe healthy choices in each food group  Describe portion size of foods  Use plate method for meal planning  Demonstrate how to read Nutrition Facts food label  Set realistic goals for weight loss, diet changes, and physical activity.   Goals:  1. Make healthy food choices in each food group.  2. Reduce portion size of foods.  3. Increase fruit and vegetable intake.  4. Use plate method for meal planning.  5. Increase physical activity.    Handouts given:   1. Nutrition Strategies for Weight Loss   2. Meal plan/portion card   3. MyPlate Planner   4. Weight Management Recipe Resources   5. Bake, [place]il, [place]

## 2015-10-11 ENCOUNTER — Ambulatory Visit (INDEPENDENT_AMBULATORY_CARE_PROVIDER_SITE_OTHER): Payer: 59

## 2015-10-11 ENCOUNTER — Ambulatory Visit (INDEPENDENT_AMBULATORY_CARE_PROVIDER_SITE_OTHER): Payer: 59 | Admitting: Family Medicine

## 2015-10-11 ENCOUNTER — Encounter: Payer: Self-pay | Admitting: Family Medicine

## 2015-10-11 VITALS — BP 140/90 | HR 93 | Ht 66.0 in | Wt 188.0 lb

## 2015-10-11 DIAGNOSIS — Z8739 Personal history of other diseases of the musculoskeletal system and connective tissue: Secondary | ICD-10-CM

## 2015-10-11 DIAGNOSIS — S92252A Displaced fracture of navicular [scaphoid] of left foot, initial encounter for closed fracture: Secondary | ICD-10-CM | POA: Diagnosis not present

## 2015-10-11 DIAGNOSIS — M79672 Pain in left foot: Secondary | ICD-10-CM | POA: Diagnosis not present

## 2015-10-11 MED ORDER — VITAMIN D (ERGOCALCIFEROL) 1.25 MG (50000 UNIT) PO CAPS
50000.0000 [IU] | ORAL_CAPSULE | ORAL | Status: DC
Start: 2015-10-11 — End: 2016-04-22

## 2015-10-11 MED FILL — VIT D2 1.25 MG (50,000 UNIT: 1.25 MG | 84 days supply | Qty: 12 | Fill #0

## 2015-10-11 NOTE — Progress Notes (Signed)
Pre visit review using our clinic review tool, if applicable. No additional management support is needed unless otherwise documented below in the visit note. 

## 2015-10-11 NOTE — Progress Notes (Signed)
[name] [address] [place], [place] [address] Phone: [phone] Subjective:     CC: left foot pain [id number] Sherry [name] is a 60 y.o. female coming in with complaint of left foot pain.   patient was seen greater than 8 months ago with a right sided navicular stress fracture that seemed to heal with conservative therapy. Patient states though that she is having left foot pain. Patient states that now it is skin was to be sore even with walking. Was very intermittent before and now seems to be chronic. Very similar to what her previous side was. Patient states that she has not made any significant changes. New Shoes recently. Patient has not tried any home modalities 4. States that it does seem to be very painful in the morning. No swelling, no numbness.  Past Medical History  Diagnosis Date  . Complication of anesthesia   . PONV (postoperative nausea and vomiting)   . Hepatitis     hx hep b 1984  . Hypothyroidism   . Deviated septum   . Sinusitis, chronic   . Hyperlipemia    Past Surgical History  Procedure Laterality Date  . Herpes simplex virus dfa    . Anterior cruciate ligament repair  rt  . Tubal ligation    . Hand surgery      lt  . Nasal septoplasty w/ turbinoplasty  08/30/2011    Procedure: NASAL SEPTOPLASTY WITH TURBINATE REDUCTION;  Surgeon: Jerrell Belfast, MD;  Location: [facility];  Service: ENT;  Laterality: N/A;  nasal septoplasty, bil. inferior turbinated reduction, bil. endoscopic concha bullosa and anterior ethmoid and maxillary antrostomy, ethmoidectomy  . Nasal sinus surgery  08/30/2011    Procedure: ENDOSCOPIC SINUS SURGERY;  Surgeon: Jerrell Belfast, MD;  Location: [facility];  Service: ENT;  Laterality: N/A;  . Knee surgery Left    Social History  Substance Use Topics  . Smoking status: Never Smoker   . Smokeless tobacco: None  . Alcohol Use: No   No Known Allergies Family  History  Problem Relation Age of Onset  . Leukemia Mother   . Cancer Father     lung    Past medical history, social, surgical and family history all reviewed in electronic medical record.   Review of Systems: No headache, visual changes, nausea, vomiting, diarrhea, constipation, dizziness, abdominal pain, skin rash, fevers, chills, night sweats, weight loss, swollen lymph nodes, body aches, joint swelling, muscle aches, chest pain, shortness of breath, mood changes.   Objective Blood pressure 140/90, pulse 93, height 5\' 6"  (1.676 m), weight 188 lb (85.276 kg), SpO2 97 %.  General: No apparent distress alert and oriented x3 mood and affect normal, dressed appropriately.  HEENT: Pupils equal, extraocular movements intact  Respiratory: Patient's speak in full sentences and does not appear short of breath  Cardiovascular: No lower extremity edema, non tender, no erythema  Skin: Warm dry intact with no signs of infection or rash on extremities or on axial skeleton.  Abdomen: Soft nontender  Neuro: Cranial nerves II through XII are intact, neurovascularly intact in all extremities with 2+ DTRs and 2+ pulses.  Lymph: No lymphadenopathy of posterior or anterior cervical chain or axillae bilaterally.  Gait normal with good balance and coordination.  MSK:  Non tender with full range of motion and good stability and symmetric strength and tone of shoulders, elbows, wrist, hip, knees bilaterally.  Ankle: left No swelling noted Range of motion is  full in all directions. Strength is 5/5 in all directions. Stable lateral and medial ligaments; squeeze test and kleiger test unremarkable; Talar dome nontender; No pain at base of 5th MT; No tenderness over cuboid; Tender over the navicular prominence with positive percussion Mild tenderness over the posterior tibialis tendon as well. No sign of peroneal tendon subluxations or tenderness to palpation Negative tarsal tunnel tinel's Able to walk 4  steps. Contralateral ankle unremarkable Foot exam shows the patient has severe over pronation of the hindfoot bilaterally.   MSK US performed of: left This study was ordered, performed, and interpreted by Charlann Boxer D.O.  Foot/Ankle:   All structures visualized.   Talar dome unremarkable  Ankle mortise without effusion. Peroneus longus and brevis tendons unremarkable on long and transverse views without sheath effusions. Posterior tibialis, flexor hallucis longus, and flexor digitorum longus tendons unremarkable on long and transverse views without sheath effusions. Navicular bone nowhere posterior tibialis does insert does have what appears to be an avulsion fracture. Very small approximately 0.1 cm in displacement. Mild hypoechoic and increasing Doppler flow noted Achilles tendon visualized along length of tendon and unremarkable on long and transverse views without sheath effusion. Anterior Talofibular Ligament and Calcaneofibular Ligaments unremarkable and intact. Deltoid Ligament unremarkable and intact. Plantar fascia intact and without effusion, normal thickness. No increased doppler signal, cap sign, or thickening of tibial cortex. Power doppler signal normal.  IMPRESSION: small avulsion navicular fracture       Impression and Recommendations:     This case required medical decision making of moderate complexity.

## 2015-10-11 NOTE — Patient Instructions (Addendum)
Great to see you as always  Sorry for the bad news Once weekly vitamin D for the next 12 weeks.  Ice 20 minutes 2 times daily. Usually after activity and before bed. Xelero shoes could be a good alternative to your boot Wrote a prescription for the balancer if you do the boot.  We will get a bone density test  See me again in 3 weeks and we will make sure you are healing.

## 2015-10-11 NOTE — Assessment & Plan Note (Signed)
Patient has what appears to be another navicular stress fracture noted. Increasing Doppler flow as well as some mild avulsion. Patient's posterior tibialis tendon seems to have very minimal hypoechoic changes. Patient's will wear the boot as needed, with this being the second fracture bone density is ordered. We discussed with patient again about topical anti-inflammatories and icing protocol. Patient will do once weekly vitamin D for the next 12 weeks. Follow-up in 3 weeks to make sure we are improving.  Spent  25 minutes with patient face-to-face and had greater than 50% of counseling including as described above in assessment and plan.

## 2015-10-24 ENCOUNTER — Ambulatory Visit (INDEPENDENT_AMBULATORY_CARE_PROVIDER_SITE_OTHER)
Admission: RE | Admit: 2015-10-24 | Discharge: 2015-10-24 | Disposition: A | Discharge status: Home / Self Care | Payer: 59 | Source: Ambulatory Visit | Attending: Family Medicine | Admitting: Family Medicine

## 2015-10-24 DIAGNOSIS — Z8739 Personal history of other diseases of the musculoskeletal system and connective tissue: Secondary | ICD-10-CM

## 2015-11-01 ENCOUNTER — Encounter: Payer: Self-pay | Admitting: Family Medicine

## 2015-11-01 ENCOUNTER — Ambulatory Visit (INDEPENDENT_AMBULATORY_CARE_PROVIDER_SITE_OTHER): Payer: 59 | Admitting: Family Medicine

## 2015-11-01 ENCOUNTER — Other Ambulatory Visit: Payer: Self-pay

## 2015-11-01 VITALS — BP 122/84 | HR 74

## 2015-11-01 DIAGNOSIS — M8588 Other specified disorders of bone density and structure, other site: Secondary | ICD-10-CM | POA: Insufficient documentation

## 2015-11-01 DIAGNOSIS — M79672 Pain in left foot: Secondary | ICD-10-CM

## 2015-11-01 DIAGNOSIS — S92252A Displaced fracture of navicular [scaphoid] of left foot, initial encounter for closed fracture: Secondary | ICD-10-CM | POA: Diagnosis not present

## 2015-11-01 DIAGNOSIS — M8580 Other specified disorders of bone density and structure, unspecified site: Secondary | ICD-10-CM | POA: Diagnosis not present

## 2015-11-01 NOTE — Assessment & Plan Note (Signed)
Encouraged patient to continue the once weekly vitamin D supplementation. We will hold on any bisphosphonates at this time.

## 2015-11-01 NOTE — Assessment & Plan Note (Signed)
Patient's pain is resolving at this time. Patient is making significant progress. We discussed icing regimen and home exercise. I discussed which activities to do in which ones to avoid. Patient will slowly progress. Patient will continue with the vitamin D supplementation I think indefinitely with patient having osteopenia. Patient will come back and see me again only if pain does not seem to improve.  Spent  25 minutes with patient face-to-face and had greater than 50% of counseling including as described above in assessment and plan.

## 2015-11-01 NOTE — Patient Instructions (Addendum)
Good to see you Ice is your friend Continue the vitamin D As long as you do well see me when you need me [employer]!

## 2015-11-01 NOTE — Progress Notes (Signed)
Pre visit review using our clinic review tool, if applicable. No additional management support is needed unless otherwise documented below in the visit note. 

## 2015-11-01 NOTE — Progress Notes (Signed)
[name] [place] [place], [place] [address] Phone: [phone] Subjective:     CC: left foot pain [id number] Sherry [name] is a 60 y.o. female coming in with complaint of left foot pain.    patient was found to have a navicular stress fracture at last follow-up. Patient was to wear a boot for 2 weeks and then transition back into a shoe. Patient started on once weekly vitamin D. States that the pain is significantly improved. Approximate 70% improved. Swelling went away after approximately 1 week. Feeling much better and much more weaker resolution in her contralateral side.  Patient was also sent for a bone density scan. Patient bone density is on the verge for osteoporosis. Patient though states that she is not having any worsening back pain at this moment.  Past Medical History  Diagnosis Date  . Complication of anesthesia   . PONV (postoperative nausea and vomiting)   . Hepatitis     hx hep b 1984  . Hypothyroidism   . Deviated septum   . Sinusitis, chronic   . Hyperlipemia    Past Surgical History  Procedure Laterality Date  . Herpes simplex virus dfa    . Anterior cruciate ligament repair  rt  . Tubal ligation    . Hand surgery      lt  . Nasal septoplasty w/ turbinoplasty  08/30/2011    Procedure: NASAL SEPTOPLASTY WITH TURBINATE REDUCTION;  Surgeon: Jerrell Belfast, MD;  Location: [facility];  Service: ENT;  Laterality: N/A;  nasal septoplasty, bil. inferior turbinated reduction, bil. endoscopic concha bullosa and anterior ethmoid and maxillary antrostomy, ethmoidectomy  . Nasal sinus surgery  08/30/2011    Procedure: ENDOSCOPIC SINUS SURGERY;  Surgeon: Jerrell Belfast, MD;  Location: [facility];  Service: ENT;  Laterality: N/A;  . Knee surgery Left    Social History  Substance Use Topics  . Smoking status: Never Smoker   . Smokeless tobacco: None  . Alcohol Use: No   No Known  Allergies Family History  Problem Relation Age of Onset  . Leukemia Mother   . Cancer Father     lung    Past medical history, social, surgical and family history all reviewed in electronic medical record.   Review of Systems: No headache, visual changes, nausea, vomiting, diarrhea, constipation, dizziness, abdominal pain, skin rash, fevers, chills, night sweats, weight loss, swollen lymph nodes, body aches, joint swelling, muscle aches, chest pain, shortness of breath, mood changes.   Objective Blood pressure 122/84, pulse 74, SpO2 97 %.  General: No apparent distress alert and oriented x3 mood and affect normal, dressed appropriately.  HEENT: Pupils equal, extraocular movements intact  Respiratory: Patient's speak in full sentences and does not appear short of breath  Cardiovascular: No lower extremity edema, non tender, no erythema  Skin: Warm dry intact with no signs of infection or rash on extremities or on axial skeleton.  Abdomen: Soft nontender  Neuro: Cranial nerves II through XII are intact, neurovascularly intact in all extremities with 2+ DTRs and 2+ pulses.  Lymph: No lymphadenopathy of posterior or anterior cervical chain or axillae bilaterally.  Gait normal with good balance and coordination.  MSK:  Non tender with full range of motion and good stability and symmetric strength and tone of shoulders, elbows, wrist, hip, knees bilaterally.  Ankle: left No swelling noted Range of motion is full in all directions. Strength is 5/5 in all directions. Stable lateral  and medial ligaments; squeeze test and kleiger test unremarkable; Talar dome nontender; No pain at base of 5th MT; No tenderness over cuboid; Still mild tenderness over the navicular prominence No sign of peroneal tendon subluxations or tenderness to palpation Negative tarsal tunnel tinel's Able to walk 4 steps. Contralateral ankle unremarkable Foot exam shows the patient has severe over pronation of the  hindfoot bilaterally.   MSK US performed of: left This study was ordered, performed, and interpreted by Charlann Boxer D.O.  Foot/Ankle:   All structures visualized.   Talar dome unremarkable  Ankle mortise without effusion. Peroneus longus and brevis tendons unremarkable on long and transverse views without sheath effusions. Posterior tibialis, flexor hallucis longus, and flexor digitorum longus tendons unremarkable on long and transverse views without sheath effusions. Particular bone does have good callus formation at this moment. No significant displacement. Interval healing noted. Achilles tendon visualized along length of tendon and unremarkable on long and transverse views without sheath effusion. Anterior Talofibular Ligament and Calcaneofibular Ligaments unremarkable and intact. Deltoid Ligament unremarkable and intact. Plantar fascia intact and without effusion, normal thickness. No increased doppler signal, cap sign, or thickening of tibial cortex. Power doppler signal normal.  IMPRESSION: Healing avulsion of the navicular bone       Impression and Recommendations:     This case required medical decision making of moderate complexity.

## 2015-11-02 ENCOUNTER — Other Ambulatory Visit: Payer: Self-pay | Admitting: Family Medicine

## 2015-11-02 MED ORDER — AMBULATORY NON FORMULARY MEDICATION: Status: DC

## 2016-01-08 DIAGNOSIS — Z0100 Encounter for examination of eyes and vision without abnormal findings: Secondary | ICD-10-CM | POA: Diagnosis not present
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
## 2016-04-22 ENCOUNTER — Encounter: Payer: Self-pay | Admitting: Family Medicine

## 2016-04-22 ENCOUNTER — Ambulatory Visit (INDEPENDENT_AMBULATORY_CARE_PROVIDER_SITE_OTHER): Payer: 59 | Admitting: Family Medicine

## 2016-04-22 DIAGNOSIS — M940 Chondrocostal junction syndrome [Tietze]: Secondary | ICD-10-CM | POA: Insufficient documentation

## 2016-04-22 DIAGNOSIS — G5701 Lesion of sciatic nerve, right lower limb: Secondary | ICD-10-CM

## 2016-04-22 DIAGNOSIS — M25511 Pain in right shoulder: Secondary | ICD-10-CM | POA: Insufficient documentation

## 2016-04-22 DIAGNOSIS — M999 Biomechanical lesion, unspecified: Secondary | ICD-10-CM | POA: Insufficient documentation

## 2016-04-22 MED ORDER — GABAPENTIN 100 MG PO CAPS: 200.0000 mg | ORAL_CAPSULE | Freq: Every day | ORAL | 3 refills | Status: DC

## 2016-04-22 MED FILL — GABAPENTIN 100 MG CAPSULE: 100 | 30 days supply | Qty: 60 | Fill #0

## 2016-04-22 NOTE — Assessment & Plan Note (Signed)
Patient did have a trigger point likely secondary to a slipped rib. We discussed icing regimen and home exercises, we discussed which activities to do an which was to avoid. Patient continue to stay active. Follow-up with me again in 4-6 weeks

## 2016-04-22 NOTE — Patient Instructions (Signed)
Good to see you.  FOr the neck Ice 20 minutes 2 times daily. Usually after activity and before bed. Tennis ball between shoulder blades with sitting a long amount of time.  On wall with heels, butt shoulder and head touching for a goal of 5 minutes daily  For the hip pain I think it is piriformis syndrome.  Exercises 3 times a week.  I am ok with you ocntinuing PT Gabapentin 200mg  at night pennsaid pinkie amount topically 2 times daily as needed.  Tennis ball in back right pocket with sitting.  See me again in 3 weeks if not a lot better.

## 2016-04-22 NOTE — Assessment & Plan Note (Signed)
Decision today to treat with OMT was based on Physical Exam  After verbal consent patient was treated with HVLA, ME< FPR techniques in cervical rib and thoracic and lumbar areas  Patient tolerated the procedure well with improvement in symptoms  Patient given exercises, stretches and lifestyle modifications  See medications in patient instructions if given  Patient will follow up in 3-6 weeks

## 2016-04-22 NOTE — Assessment & Plan Note (Signed)
Piriformis Syndrome  Using an anatomical model, reviewed with the patient the structures involved and how they related to diagnosis. The patient indicated understanding.   The patient was given a handout from Dr. Rouzier's book "The Sports Medicine Patient Advisor" describing the anatomy and rehabilitation of the following condition: Piriformis Syndrome  Also given a handout with more extensive Piriformis stretching, hip flexor and abductor strengthening, ham stretching  Rec deep massage, explained self-massage with ball RTC in 4 weeks 

## 2016-04-22 NOTE — Assessment & Plan Note (Signed)
Discussed posture and ergonomics throughout the day. Responded well to osteopathic manipulation. No significant change in medications at this time except for the gabapentin. Follow-up again in 4-6 weeks

## 2016-05-13 ENCOUNTER — Ambulatory Visit (INDEPENDENT_AMBULATORY_CARE_PROVIDER_SITE_OTHER)
Admission: RE | Admit: 2016-05-13 | Discharge: 2016-05-13 | Disposition: A | Discharge status: Home / Self Care | Payer: 59 | Source: Ambulatory Visit | Attending: Family Medicine | Admitting: Family Medicine

## 2016-05-13 ENCOUNTER — Ambulatory Visit (INDEPENDENT_AMBULATORY_CARE_PROVIDER_SITE_OTHER): Payer: 59 | Admitting: Family Medicine

## 2016-05-13 ENCOUNTER — Encounter: Payer: Self-pay | Admitting: Family Medicine

## 2016-05-13 VITALS — BP 130/84 | HR 71 | Ht 67.0 in

## 2016-05-13 DIAGNOSIS — G5701 Lesion of sciatic nerve, right lower limb: Secondary | ICD-10-CM | POA: Diagnosis not present

## 2016-05-13 DIAGNOSIS — M940 Chondrocostal junction syndrome [Tietze]: Secondary | ICD-10-CM

## 2016-05-13 DIAGNOSIS — M999 Biomechanical lesion, unspecified: Secondary | ICD-10-CM

## 2016-05-13 DIAGNOSIS — M545 Low back pain: Secondary | ICD-10-CM | POA: Diagnosis not present

## 2016-05-13 MED ORDER — MELOXICAM 15 MG PO TABS: 15.0000 mg | ORAL_TABLET | Freq: Every day | ORAL | 0 refills | Status: DC

## 2016-05-13 MED FILL — MELOXICAM 15 MG TABLET: 15 | 30 days supply | Qty: 30 | Fill #0

## 2016-05-13 NOTE — Assessment & Plan Note (Signed)
Decision today to treat with OMT was based on Physical Exam  After verbal consent patient was treated with HVLA, ME< FPR techniques in cervical rib and thoracic and lumbar areas  Patient tolerated the procedure well with improvement in symptoms  Patient given exercises, stretches and lifestyle modifications  See medications in patient instructions if given  Patient will follow up in 4-6 weeks
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
## 2016-05-13 NOTE — Assessment & Plan Note (Signed)
Discussed with patient at great length. Encourage her to do more of the home exercises and the postural control exercises. We discussed icing regimen in which activities doing which ones to avoid. Patient will continue to be active otherwise. Does respond well to osteopathic manipulation. Follow-up again in 4-6 weeks.

## 2016-05-13 NOTE — Assessment & Plan Note (Signed)
Patient symptoms seemed to be more of a lumbar radiculopathy today. We discussed with patient at great length. X-rays ordered today. If worsening symptoms we may need to consider advanced imaging. Has done formal physical therapy for piriformis syndrome previously. Patient is on gabapentin encourage her to try 300 g. Given meloxicam to see if this will help with some of the pain control as well. Did respond well to manipulation. Follow-up again in 4-6 weeks.

## 2016-05-13 NOTE — Patient Instructions (Addendum)
God to see you  I am sorry you are still hurting.  Lets try the gabapentin 300mg  nightly and see how that goes for a week. If too tired then decrease dose.  Also meloxicam daily for 7 days then as needed.  Xray downstairs today  In 1 week send me a message and if not better we will consider MRI of the back  I am optimistic you will do well.  Happy holidays!  Otherwise see me again in 4-6 weeks.

## 2016-05-29 MED FILL — GABAPENTIN 100 MG CAPSULE: 100 | 30 days supply | Qty: 60 | Fill #1
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
## 2016-06-10 ENCOUNTER — Ambulatory Visit (INDEPENDENT_AMBULATORY_CARE_PROVIDER_SITE_OTHER): Payer: 59 | Admitting: Family Medicine

## 2016-06-10 ENCOUNTER — Encounter: Payer: Self-pay | Admitting: Family Medicine

## 2016-06-10 VITALS — BP 128/84 | HR 79 | Ht 67.0 in

## 2016-06-10 DIAGNOSIS — M999 Biomechanical lesion, unspecified: Secondary | ICD-10-CM

## 2016-06-10 DIAGNOSIS — G5701 Lesion of sciatic nerve, right lower limb: Secondary | ICD-10-CM

## 2016-06-10 MED ORDER — PREDNISONE 50 MG PO TABS: 50.0000 mg | ORAL_TABLET | Freq: Every day | ORAL | 0 refills | Status: DC

## 2016-06-10 MED ORDER — GABAPENTIN 400 MG PO CAPS: 400.0000 mg | ORAL_CAPSULE | Freq: Every day | ORAL | 1 refills | Status: DC

## 2016-06-10 MED FILL — GABAPENTIN 400 MG CAPSULE: 400 | 30 days supply | Qty: 30 | Fill #0

## 2016-06-10 MED FILL — predniSONE 50 MG TABS: 50 | 5 days supply | Qty: 5 | Fill #0

## 2016-06-10 NOTE — Assessment & Plan Note (Signed)
Differential includes lumbar radiculopathy. No significant worsening but no improvement .Discussed icing regimen.Started on prednisone. Increase gabapentin to 400 mg.Discussed which activities to avoid. Still responded well to manipulation. Follow-up againin 4-6 weeks.Worsening symptoms get MRI

## 2016-06-10 NOTE — Patient Instructions (Signed)
Good to see you  Ice is your friend Prednisone daily for 5 days Gabapentin 400mg  at night Keep doing the exercises  See me again in 4 weeks.

## 2016-06-10 NOTE — Assessment & Plan Note (Signed)
Decision today to treat with OMT was based on Physical Exam  After verbal consent patient was treated with HVLA, ME< FPR techniques in cervical rib and thoracic and lumbar areas  Patient tolerated the procedure well with improvement in symptoms  Patient given exercises, stretches and lifestyle modifications  See medications in patient instructions if given  Patient will follow up in 4-6 weeks

## 2016-06-17 DIAGNOSIS — G4700 Insomnia, unspecified: Secondary | ICD-10-CM | POA: Diagnosis not present

## 2016-06-17 DIAGNOSIS — Z0000 Encounter for general adult medical examination without abnormal findings: Secondary | ICD-10-CM | POA: Diagnosis not present

## 2016-07-08 ENCOUNTER — Ambulatory Visit (INDEPENDENT_AMBULATORY_CARE_PROVIDER_SITE_OTHER): Payer: 59 | Admitting: Family Medicine

## 2016-07-08 ENCOUNTER — Encounter: Payer: Self-pay | Admitting: Family Medicine

## 2016-07-08 VITALS — BP 126/82 | HR 76 | Ht 67.0 in

## 2016-07-08 DIAGNOSIS — M999 Biomechanical lesion, unspecified: Secondary | ICD-10-CM | POA: Diagnosis not present

## 2016-07-08 DIAGNOSIS — G5701 Lesion of sciatic nerve, right lower limb: Secondary | ICD-10-CM

## 2016-07-08 MED ORDER — GABAPENTIN 400 MG PO CAPS: 400.0000 mg | ORAL_CAPSULE | Freq: Every day | ORAL | 1 refills | Status: DC

## 2016-07-08 MED FILL — GABAPENTIN 400 MG CAPSULE: 400 | 90 days supply | Qty: 90 | Fill #0

## 2016-07-08 NOTE — Assessment & Plan Note (Signed)
Continue to monitor. Dicussed icing, HEP, will need to monitor for lumbar radiculopathy.  RTC in 6-8 weeks.

## 2016-07-08 NOTE — Assessment & Plan Note (Signed)
Decision today to treat with OMT was based on Physical Exam  After verbal consent patient was treated with HVLA, ME< FPR techniques in cervical rib and thoracic and lumbar areas  Patient tolerated the procedure well with improvement in symptoms  Patient given exercises, stretches and lifestyle modifications  See medications in patient instructions if given  Patient will follow up in 6-8 weeks

## 2016-07-08 NOTE — Progress Notes (Signed)
**Note DeSherryIdentified via Obfuscation** [name] [place], [place] [address] Phone: [phone] Subjective:     CC: right hip and right shoulder pain FollowSherryup [id number]  Sherry [name] is a 61 y.o. female coming in with     1. Right hip pain. Still having pain. Still seems to be localized to the hip. Was diagnosed with more of a rightSherrysided piriformis syndrome. Patient was given specific exercises for this. XSherryrays did show patient did have moderate arthritic changes especially at L5SherryS1.  Still having pain. probably improving slowly. Still mild pain in AM, feels the exercises gabapentin and manipulation is helping.     Past Medical History:  Diagnosis Date  . Complication of anesthesia   . Deviated septum   . Hepatitis    hx hep b 1984  . Hyperlipemia   . Hypothyroidism   . PONV (postoperative nausea and vomiting)   . Sinusitis, chronic    Past Surgical History:  Procedure Laterality Date  . ANTERIOR CRUCIATE LIGAMENT REPAIR  rt  . HAND SURGERY     lt  . HERPES SIMPLEX VIRUS DFA    . KNEE SURGERY Left   . NASAL SEPTOPLASTY W/ TURBINOPLASTY  08/30/2011   Procedure: NASAL SEPTOPLASTY WITH TURBINATE REDUCTION;  Surgeon: Jerrell Belfast, MD;  Location: [facility];  Service: ENT;  Laterality: N/A;  nasal septoplasty, bil. inferior turbinated reduction, bil. endoscopic concha bullosa and anterior ethmoid and maxillary antrostomy, ethmoidectomy  . NASAL SINUS SURGERY  08/30/2011   Procedure: ENDOSCOPIC SINUS SURGERY;  Surgeon: Jerrell Belfast, MD;  Location: [facility];  Service: ENT;  Laterality: N/A;  . TUBAL LIGATION     Social History  Substance Use Topics  . Smoking status: Never Smoker  . Smokeless tobacco: Not on file  . Alcohol use No   No Known Allergies Family History  Problem Relation Age of Onset  . Leukemia Mother   . Cancer Father     lung    Past medical history, social, surgical and family history all reviewed in  electronic medical record.   Review of Systems: No headache, visual changes, nausea, vomiting, diarrhea, constipation, dizziness, abdominal pain, skin rash, fevers, chills, night sweats, weight loss, swollen lymph nodes, body aches, joint swelling, muscle aches, chest pain, shortness of breath, mood changes.    Objective  Blood pressure 126/82, pulse 76, height 5\' 7"  (1.702 m), SpO2 96 %.  Systems examined below as of 07/08/16 General: NAD A&O x3 mood, affect normal  HEENT: Pupils equal, extraocular movements intact no nystagmus Respiratory: not short of breath at rest or with speaking Cardiovascular: No lower extremity edema, non tender Skin: Warm dry intact with no signs of infection or rash on extremities or on axial skeleton. Abdomen: Soft nontender, no masses Neuro: Cranial nerves  intact, neurovascularly intact in all extremities with 2+ DTRs and 2+ pulses. Lymph: No lymphadenopathy appreciated today  Gait normal with good balance and coordination.  MSK: Non tender with full range of motion and good stability and symmetric strength and tone of shoulders, elbows, wrist,  knee hips and ankles bilaterally.  Mild arthritic changes.    Back exam shows neg SLT, mild positive FABER on the right. Full ROM.  Mld TTP in paraspinal musculature.    Osteopathic findings Osteopathic findings Cervical C6 flexed rotated and side bent left T3 extended rotated and side bent right inhaled third rib T7 extended rotated and side bent left L2 flexed rotated and side bent right  Sacrum right on right    Impression and Recommendations:     This case required medical decision making of moderate complexity.

## 2016-07-08 NOTE — Patient Instructions (Signed)
Good to see you  Ice is your friend I think you are making progress.  I would stay active Continue the exercises  Refilled the gabapentin  See me again in 6-8 weeks.

## 2016-08-17 NOTE — Progress Notes (Signed)
[name] [place], [place] [address] Phone: [phone] Subjective:     CC: right hip and right shoulder pain Follow-up [id number]  Sherry [name] is a 61 y.o. female coming in with     1. Right hip pain. Still having pain. Still seems to be localized to the hip. Was diagnosed with more of a right-sided piriformis syndrome. Patient was given specific exercises for this. X-rays did show patient did have moderate arthritic changes especially at L5-S1.  Patient states now seems to be worsening. States that if she works more than 2 shifts in a row she starts having radiation of symptoms down the leg. Patient states sometimes it can be even followed with weakness where she has difficulty getting out of her car.     Past Medical History:  Diagnosis Date  . Complication of anesthesia   . Deviated septum   . Hepatitis    hx hep b 1984  . Hyperlipemia   . Hypothyroidism   . PONV (postoperative nausea and vomiting)   . Sinusitis, chronic    Past Surgical History:  Procedure Laterality Date  . ANTERIOR CRUCIATE LIGAMENT REPAIR  rt  . HAND SURGERY     lt  . HERPES SIMPLEX VIRUS DFA    . KNEE SURGERY Left   . NASAL SEPTOPLASTY W/ TURBINOPLASTY  08/30/2011   Procedure: NASAL SEPTOPLASTY WITH TURBINATE REDUCTION;  Surgeon: Jerrell Belfast, MD;  Location: [facility];  Service: ENT;  Laterality: N/A;  nasal septoplasty, bil. inferior turbinated reduction, bil. endoscopic concha bullosa and anterior ethmoid and maxillary antrostomy, ethmoidectomy  . NASAL SINUS SURGERY  08/30/2011   Procedure: ENDOSCOPIC SINUS SURGERY;  Surgeon: Jerrell Belfast, MD;  Location: [facility];  Service: ENT;  Laterality: N/A;  . TUBAL LIGATION     Social History  Substance Use Topics  . Smoking status: Never Smoker  . Smokeless tobacco: Never Used  . Alcohol use No   No Known Allergies Family History  Problem Relation Age of  Onset  . Leukemia Mother   . Cancer Father     lung    Past medical history, social, surgical and family history all reviewed in electronic medical record.   Review of Systems: No headache, visual changes, nausea, vomiting, diarrhea, constipation, dizziness, abdominal pain, skin rash, fevers, chills, night sweats, weight loss, swollen lymph nodes, body aches, joint swelling, muscle aches, chest pain, shortness of breath, mood changes.    Objective  Blood pressure 132/84, pulse 78, height 5\' 7"  (1.702 m), SpO2 98 %.  Systems examined below as of 08/19/16 General: NAD A&O x3 mood, affect normal  HEENT: Pupils equal, extraocular movements intact no nystagmus Respiratory: not short of breath at rest or with speaking Cardiovascular: No lower extremity edema, non tender Skin: Warm dry intact with no signs of infection or rash on extremities or on axial skeleton. Abdomen: Soft nontender, no masses Neuro: Cranial nerves  intact, neurovascularly intact in all extremities with 2+ DTRs and 2+ pulses. Lymph: No lymphadenopathy appreciated today  Gait antalgic gait  MSK: Non tender with full range of motion and good stability and symmetric strength and tone of shoulders, elbows, wrist, hips and ankles bilaterally.  Arthritic changes of the knees and multiple other joints.   Exam shows the patient is a more of a positive straight leg test today. Some increasing tightness of the hamstring. Positive Faber test. Seems more uncomfortable overall.  Impression and Recommendations:     This case required medical decision making of moderate complexity.

## 2016-08-19 ENCOUNTER — Encounter: Payer: Self-pay | Admitting: Family Medicine

## 2016-08-19 ENCOUNTER — Ambulatory Visit (INDEPENDENT_AMBULATORY_CARE_PROVIDER_SITE_OTHER): Payer: 59 | Admitting: Family Medicine

## 2016-08-19 DIAGNOSIS — M5416 Radiculopathy, lumbar region: Secondary | ICD-10-CM

## 2016-08-19 MED ORDER — TRAMADOL HCL 50 MG PO TABS: 50.0000 mg | ORAL_TABLET | Freq: Three times a day (TID) | ORAL | 0 refills | Status: DC | PRN

## 2016-08-19 MED ORDER — VENLAFAXINE HCL ER 37.5 MG PO CP24: 37.5000 mg | ORAL_CAPSULE | Freq: Every day | ORAL | 1 refills | Status: DC

## 2016-08-19 MED FILL — traMADol HCL 50 MG TABS: 50 | 10 days supply | Qty: 30 | Fill #0

## 2016-08-19 MED FILL — VENLAFAXINE HCL ER 37.5 MG: 37.5 | 30 days supply | Qty: 30 | Fill #0

## 2016-08-19 NOTE — Assessment & Plan Note (Signed)
Patient does have more of a lumbar radiculopathy. We discussed with patient at great length. Patient does not want any type advance imaging at this time. Encourage patient on to tell us when she has any significant worsening symptoms such as numbness, weakness, or any significant bowel or bladder incontinence that she needs to seek medical attention immediately. We discussed which activities doing which was potentially avoid. Patient will continue to stay active. Follow-up with me again in 4 weeks.

## 2016-08-19 NOTE — Patient Instructions (Signed)
Good to see you  I think most of the problem is your back but a lot is going on.  I think continue everything else you are doing.  Effexor 37.5mg  daily can help with the pain during the day.  Tramadol for breakthrough pain if really bad.  Ice is your friend.  See me again in 4 weeks or call sooner if you think we should get MRI.

## 2016-09-15 NOTE — Progress Notes (Signed)
[name] [place], [place] [address] Phone: [phone] Subjective:     CC: right hip and right shoulder pain Follow-up [id number]  Sherry [name] is a 61 y.o. female coming in with     1. Right hip pain. Still having pain. Still seems to be localized to the hip. Was diagnosed with more of a right-sided piriformis syndrome. Patient was given specific exercises for this. X-rays did show patient did have moderate arthritic changes especially at L5-S1.  Patient at last exam was having more radiation of the pain down the leg.  Seemed to be affecting work and sleep. Patient states The steroid didn't seem to help. Patient is on the Effexor and an states that it seems to be making some improvement as well. Patient states that there is still a soreness but is making some progress.     Past Medical History:  Diagnosis Date  . Complication of anesthesia   . Deviated septum   . Hepatitis    hx hep b 1984  . Hyperlipemia   . Hypothyroidism   . PONV (postoperative nausea and vomiting)   . Sinusitis, chronic    Past Surgical History:  Procedure Laterality Date  . ANTERIOR CRUCIATE LIGAMENT REPAIR  rt  . HAND SURGERY     lt  . HERPES SIMPLEX VIRUS DFA    . KNEE SURGERY Left   . NASAL SEPTOPLASTY W/ TURBINOPLASTY  08/30/2011   Procedure: NASAL SEPTOPLASTY WITH TURBINATE REDUCTION;  Surgeon: Jerrell Belfast, MD;  Location: [facility];  Service: ENT;  Laterality: N/A;  nasal septoplasty, bil. inferior turbinated reduction, bil. endoscopic concha bullosa and anterior ethmoid and maxillary antrostomy, ethmoidectomy  . NASAL SINUS SURGERY  08/30/2011   Procedure: ENDOSCOPIC SINUS SURGERY;  Surgeon: Jerrell Belfast, MD;  Location: [facility];  Service: ENT;  Laterality: N/A;  . TUBAL LIGATION     Social History  Substance Use Topics  . Smoking status: Never Smoker  . Smokeless tobacco: Never Used  . Alcohol use No    No Known Allergies Family History  Problem Relation Age of Onset  . Leukemia Mother   . Cancer Father     lung    Past medical history, social, surgical and family history all reviewed in electronic medical record.   Review of Systems: No headache, visual changes, nausea, vomiting, diarrhea, constipation, dizziness, abdominal pain, skin rash, fevers, chills, night sweats, weight loss, swollen lymph nodes,chest pain, shortness of breath, mood changes.  Positive body aches and muscle aches   Objective  Blood pressure 124/80, pulse 82, height 5\' 7"  (1.702 m), weight 193 lb 12.8 oz (87.9 kg), SpO2 98 %.  Systems examined below as of 09/16/16 General: NAD A&O x3 mood, affect normal  HEENT: Pupils equal, extraocular movements intact no nystagmus Respiratory: not short of breath at rest or with speaking Cardiovascular: No lower extremity edema, non tender Skin: Warm dry intact with no signs of infection or rash on extremities or on axial skeleton. Abdomen: Soft nontender, no masses Neuro: Cranial nerves  intact, neurovascularly intact in all extremities with 2+ DTRs and 2+ pulses. Lymph: No lymphadenopathy appreciated today  Gait antalgic gait but improved.  MSK: Non tender with full range of motion and good stability and symmetric strength and tone of shoulders, elbows, wrist, hips and ankles bilaterally.  Arthritic changes of the knees and multiple other joints. Back Exam:  Inspection: Unremarkable  Motion: Flexion 45 deg, Extension 25  deg, Side Bending to 45 deg bilaterally,  Rotation to 45 deg bilaterally  SLR laying: Negative  XSLR laying: Negative  Palpable tenderness: Very mild discomfort to palpation of the paraspinal musculature of the lumbar spine. FABER: negative. Sensory change: Gross sensation intact to all lumbar and sacral dermatomes.  Reflexes: 2+ at both patellar tendons, 2+ at achilles tendons, Babinski's downgoing.  Strength at foot  Plantar-flexion: 5/5  Dorsi-flexion: 5/5 Eversion: 5/5 Inversion: 5/5  Leg strength  Quad: 5/5 Hamstring: 5/5 Hip flexor: 5/5 Hip abductors: 4/5 but symmetric Gait unremarkable.         Impression and Recommendations:     This case required medical decision making of moderate complexity.

## 2016-09-16 ENCOUNTER — Ambulatory Visit (INDEPENDENT_AMBULATORY_CARE_PROVIDER_SITE_OTHER): Payer: 59 | Admitting: Family Medicine

## 2016-09-16 ENCOUNTER — Encounter: Payer: Self-pay | Admitting: Family Medicine

## 2016-09-16 DIAGNOSIS — M5416 Radiculopathy, lumbar region: Secondary | ICD-10-CM

## 2016-09-16 MED ORDER — VENLAFAXINE HCL ER 75 MG PO CP24: 75.0000 mg | ORAL_CAPSULE | Freq: Every day | ORAL | 3 refills | Status: DC

## 2016-09-16 MED FILL — VENLAFAXINE HCL ER 75 MG CA: 75 | 30 days supply | Qty: 30 | Fill #0

## 2016-09-16 NOTE — Patient Instructions (Signed)
Good to see you  Overall I think you are doing great  We increase effexor to 75 mg daily  I think it can help  ICe when you need it If the back gets worse give me a call and we will consider MRI  Otherwise see me again in 6 weeks.

## 2016-09-16 NOTE — Assessment & Plan Note (Signed)
Seems more stable. Patient has been making some progress. Going very slow overall. Patient's increased Effexor. I do not think relation do anything else significant changes. Discussed the possibility starting manipulation and patient wanted all that this time. Told again in 4-6 weeks.  Spent  25 minutes with patient face-to-face and had greater than 50% of counseling including as described above in assessment and plan.

## 2016-10-11 MED FILL — VENLAFAXINE HCL ER 75 MG CA: 75 | 30 days supply | Qty: 30 | Fill #1

## 2016-10-11 MED FILL — GABAPENTIN 400 MG CAPSULE: 400 | 90 days supply | Qty: 90 | Fill #1

## 2016-10-27 NOTE — Progress Notes (Signed)
[name] [place],  [address] Phone: [phone] Subjective:     CC: right hip and right shoulder pain Follow-up [id number]  Sherry [name] is a 61 y.o. female coming in with     1. Right hip pain. Still having pain. Still seems to be localized to the hip. Was diagnosed with more of a right-sided piriformis syndrome. Patient was given specific exercises for this. X-rays did show patient did have moderate arthritic changes especially at L5-S1.   Patient was doing better on the Effexor and continues to do well. We did increase to 75 mg. No side effects. States that his only had 2 days where she had some intermittent pain. States it is very minimal. Able to do daily activities and is sleeping comfortable he. Patient feels like she is near her baseline.  Past Medical History:  Diagnosis Date  . Complication of anesthesia   . Deviated septum   . Hepatitis    hx hep b 1984  . Hyperlipemia   . Hypothyroidism   . PONV (postoperative nausea and vomiting)   . Sinusitis, chronic    Past Surgical History:  Procedure Laterality Date  . ANTERIOR CRUCIATE LIGAMENT REPAIR  rt  . HAND SURGERY     lt  . HERPES SIMPLEX VIRUS DFA    . KNEE SURGERY Left   . NASAL SEPTOPLASTY W/ TURBINOPLASTY  08/30/2011   Procedure: NASAL SEPTOPLASTY WITH TURBINATE REDUCTION;  Surgeon: Jerrell Belfast, MD;  Location: [facility];  Service: ENT;  Laterality: N/A;  nasal septoplasty, bil. inferior turbinated reduction, bil. endoscopic concha bullosa and anterior ethmoid and maxillary antrostomy, ethmoidectomy  . NASAL SINUS SURGERY  08/30/2011   Procedure: ENDOSCOPIC SINUS SURGERY;  Surgeon: Jerrell Belfast, MD;  Location: [facility];  Service: ENT;  Laterality: N/A;  . TUBAL LIGATION     Social History  Substance Use Topics  . Smoking status: Never Smoker  . Smokeless tobacco: Never Used  . Alcohol use No   No Known  Allergies Family History  Problem Relation Age of Onset  . Leukemia Mother   . Cancer Father     lung    Past medical history, social, surgical and family history all reviewed in electronic medical record.   Review of Systems: No headache, visual changes, nausea, vomiting, diarrhea, constipation, dizziness, abdominal pain, skin rash, fevers, chills, night sweats, weight loss, swollen lymph nodes, body aches, joint swelling, muscle aches, chest pain, shortness of breath, mood changes.     Objective  Blood pressure (!) 142/96, pulse 86, resp. rate 16, weight 184 lb (83.5 kg), SpO2 98 %.  Systems examined below as of 10/28/16 General: NAD A&O x3 mood, affect normal  HEENT: Pupils equal, extraocular movements intact no nystagmus Respiratory: not short of breath at rest or with speaking Cardiovascular: No lower extremity edema, non tender Skin: Warm dry intact with no signs of infection or rash on extremities or on axial skeleton. Abdomen: Soft nontender, no masses Neuro: Cranial nerves  intact, neurovascularly intact in all extremities with 2+ DTRs and 2+ pulses. Lymph: No lymphadenopathy appreciated today  Gait normal with good balance and coordination.  MSK: Non tender with full range of motion and good stability and symmetric strength and tone of shoulders, elbows, wrist,  knee hips and ankles bilaterally.  Arthritic changes of multiple joints. Back Exam:  Inspection: Unremarkable  Motion: Flexion 45 deg, Extension 25 deg, Side Bending to 45 deg bilaterally,  Rotation to 45 deg bilaterally  SLR laying: Negative  XSLR laying: Negative  Palpable tenderness: Almost nontender on exam. FABER: negative. Sensory change: Gross sensation intact to all lumbar and sacral dermatomes.  Reflexes: 2+ at both patellar tendons, 2+ at achilles tendons, Babinski's downgoing.  Strength at foot  Plantar-flexion: 5/5 Dorsi-flexion: 5/5 Eversion: 5/5 Inversion: 5/5  Leg strength  Quad: 5/5 Hamstring:  5/5 Hip flexor: 5/5 Hip abductors: 4/5 but symmetric Gait unremarkable.         Impression and Recommendations:     This case required medical decision making of moderate complexity.

## 2016-10-28 ENCOUNTER — Ambulatory Visit (INDEPENDENT_AMBULATORY_CARE_PROVIDER_SITE_OTHER): Payer: 59 | Admitting: Family Medicine

## 2016-10-28 ENCOUNTER — Encounter: Payer: Self-pay | Admitting: Family Medicine

## 2016-10-28 DIAGNOSIS — M5416 Radiculopathy, lumbar region: Secondary | ICD-10-CM | POA: Diagnosis not present

## 2016-10-28 NOTE — Assessment & Plan Note (Signed)
Patient has not really having any radicular symptoms at this time. Near full range of motion and very minimally tender. Patient is having good results with the Effexor. Patient states that when she did miss a couple doses the pain seems to be worsening. Patient was to continue this. Does not want to do any other significant increase in aggressive therapies. Patient will continue with the same conservative therapy will follow-up again in 2 months.

## 2016-11-12 MED FILL — VENLAFAXINE HCL ER 75 MG CA: 75 | 30 days supply | Qty: 30 | Fill #2

## 2016-12-16 MED FILL — VENLAFAXINE HCL ER 75 MG CA: 75 | 30 days supply | Qty: 30 | Fill #3

## 2017-01-09 ENCOUNTER — Other Ambulatory Visit: Payer: Self-pay | Admitting: Family Medicine

## 2017-01-09 MED FILL — GABAPENTIN 400 MG CAPSULE: 400 | 90 days supply | Qty: 90 | Fill #0

## 2017-01-09 MED FILL — VENLAFAXINE HCL ER 75 MG CA: 75 | 90 days supply | Qty: 90 | Fill #0

## 2017-01-09 NOTE — Telephone Encounter (Signed)
Refill done.  

## 2017-02-13 DIAGNOSIS — M25531 Pain in right wrist: Secondary | ICD-10-CM | POA: Diagnosis not present

## 2017-02-24 ENCOUNTER — Ambulatory Visit: Payer: 59 | Admitting: Family Medicine

## 2017-04-02 DIAGNOSIS — H527 Unspecified disorder of refraction: Secondary | ICD-10-CM | POA: Diagnosis not present

## 2017-04-09 MED FILL — GABAPENTIN 400 MG CAPSULE: 400 | 90 days supply | Qty: 90 | Fill #1

## 2017-04-09 MED FILL — VENLAFAXINE HCL ER 75 MG CA: 75 | 90 days supply | Qty: 90 | Fill #1

## 2017-04-30 DIAGNOSIS — H527 Unspecified disorder of refraction: Secondary | ICD-10-CM | POA: Diagnosis not present

## 2017-05-26 NOTE — Progress Notes (Signed)
[name] [place], [place] [address] Phone: [phone] Subjective:      CC: Back pain and hip pain follow-up  [id number]  Sherry [name] is a 61 y.o. female coming in with complaint of back and hip pain follow-up.  Patient has x-rays taken November 2017.  At that time but also arthritic changes of L5-S1 bilaterally.  Patient was to be on Effexor.  Patient states that she continues to have intermittent pain but it has been manageable. Her pain increases with prolonged sitting and longer periods of walking. Most of her pain continues to be located in the right hip and lumbar spine region. She also has burning pain in the right knee when her back is bothering her.  Patient states that it is starting to affect daily activities, sometimes waking her up at night.  Patient states it is manageable but unfortunately is worsening over the course of time. Denies any weakness but states that she cannot do as much activity secondary to the discomfort and pain     Past Medical History:  Diagnosis Date  . Complication of anesthesia   . Deviated septum   . Hepatitis    hx hep b 1984  . Hyperlipemia   . Hypothyroidism   . PONV (postoperative nausea and vomiting)   . Sinusitis, chronic    Past Surgical History:  Procedure Laterality Date  . ANTERIOR CRUCIATE LIGAMENT REPAIR  rt  . HAND SURGERY     lt  . HERPES SIMPLEX VIRUS DFA    . KNEE SURGERY Left   . NASAL SEPTOPLASTY W/ TURBINOPLASTY  08/30/2011   Procedure: NASAL SEPTOPLASTY WITH TURBINATE REDUCTION;  Surgeon: Jerrell Belfast, MD;  Location: [facility];  Service: ENT;  Laterality: N/A;  nasal septoplasty, bil. inferior turbinated reduction, bil. endoscopic concha bullosa and anterior ethmoid and maxillary antrostomy, ethmoidectomy  . NASAL SINUS SURGERY  08/30/2011   Procedure: ENDOSCOPIC SINUS SURGERY;  Surgeon: Jerrell Belfast, MD;  Location: [facility];   Service: ENT;  Laterality: N/A;  . TUBAL LIGATION     Social History   Socioeconomic History  . Marital status: Divorced    Spouse name: None  . Number of children: None  . Years of education: None  . Highest education level: None  Social Needs  . Financial resource strain: None  . Food insecurity - worry: None  . Food insecurity - inability: None  . Transportation needs - medical: None  . Transportation needs - non-medical: None  Occupational History  . None  Tobacco Use  . Smoking status: Never Smoker  . Smokeless tobacco: Never Used  Substance and Sexual Activity  . Alcohol use: No  . Drug use: No  . Sexual activity: None  Other Topics Concern  . None  Social History Narrative  . None   No Known Allergies Family History  Problem Relation Age of Onset  . Leukemia Mother   . Cancer Father        lung     Past medical history, social, surgical and family history all reviewed in electronic medical record.  No pertanent information unless stated regarding to the chief complaint.   Review of Systems:Review of systems updated and as accurate as of 05/27/17  No headache, visual changes, nausea, vomiting, diarrhea, constipation, dizziness, abdominal pain, skin rash, fevers, chills, night sweats, weight loss, swollen lymph nodes, body aches, joint swelling, muscle aches, chest pain, shortness of breath, mood changes.  Objective  Blood pressure 122/78, pulse 86, height 5\' 6"  (1.676 m), weight 165 lb (74.8 kg), SpO2 97 %. Systems examined below as of 05/27/17   General: No apparent distress alert and oriented x3 mood and affect normal, dressed appropriately.  HEENT: Pupils equal, extraocular movements intact  Respiratory: Patient's speak in full sentences and does not appear short of breath  Cardiovascular: No lower extremity edema, non tender, no erythema  Skin: Warm dry intact with no signs of infection or rash on extremities or on axial skeleton.  Abdomen: Soft  nontender  Neuro: Cranial nerves II through XII are intact, neurovascularly intact in all extremities with 2+ DTRs and 2+ pulses.  Lymph: No lymphadenopathy of posterior or anterior cervical chain or axillae bilaterally.  Gait normal with good balance and coordination.  MSK:  Non tender with full range of motion and good stability and symmetric strength and tone of shoulders, elbows, wrist, hip, knee and ankles bilaterally.  Mild arthritic changes in multiple joints Back Exam:  Inspection: Unremarkable  Motion: Flexion 35 deg with worsening symptoms, Extension 35 deg, Side Bending to 35 deg bilaterally,  Rotation to 45 deg bilaterally  SLR laying: Positive right side XSLR laying: Negative  Palpable tenderness: Tender to palpation in the paraspinal musculature right greater than left sacroiliac joint. FABER: Positive Faber. Sensory change: Gross sensation intact to all lumbar and sacral dermatomes.  Reflexes: 2+ at both patellar tendons, 2+ at achilles tendons, Babinski's downgoing.  Strength at foot  Plantar-flexion: 5/5 Dorsi-flexion: 5/5 Eversion: 5/5 Inversion: 5/5  Leg strength  Quad: 5/5 Hamstring: 5/5 Hip flexor: 5/5 Hip abductors: 5/5  Gait unremarkable.    Impression and Recommendations:     This case required medical decision making of moderate complexity.      Note: This dictation was prepared with Dragon dictation along with smaller phrase technology. Any transcriptional errors that result from this process are unintentional.

## 2017-05-27 ENCOUNTER — Encounter: Payer: Self-pay | Admitting: Family Medicine

## 2017-05-27 ENCOUNTER — Ambulatory Visit: Payer: 59 | Admitting: Family Medicine

## 2017-05-27 VITALS — BP 122/78 | HR 86 | Ht 66.0 in | Wt 165.0 lb

## 2017-05-27 DIAGNOSIS — M5416 Radiculopathy, lumbar region: Secondary | ICD-10-CM

## 2017-05-27 NOTE — Assessment & Plan Note (Signed)
Worsening symptoms.  Has failed all conservative therapy including formal physical therapy and this is been going on for a year.  Patient was seeming to be able to manage it for quite some time with medications and no longer R.  I do think the patient could be a candidate for an epidural steroid injection and MRI ordered today.  Feel that advanced imaging could be beneficial to help [place] with the rest of the management.  Patient will continue the other medications and will follow up with me after the imaging to discuss treatment.

## 2017-05-27 NOTE — Patient Instructions (Signed)
Stay active. We will get MRI of the back.  I truly believe this would be the goal and then possible injections or surgery depending on what we find.

## 2017-06-09 ENCOUNTER — Inpatient Hospital Stay: Admission: RE | Admit: 2017-06-09 | Payer: 59 | Source: Ambulatory Visit

## 2017-06-16 ENCOUNTER — Ambulatory Visit
Admission: RE | Admit: 2017-06-16 | Discharge: 2017-06-16 | Disposition: A | Discharge status: Home / Self Care | Payer: 59 | Source: Ambulatory Visit | Attending: Family Medicine | Admitting: Family Medicine

## 2017-06-16 DIAGNOSIS — M5416 Radiculopathy, lumbar region: Secondary | ICD-10-CM

## 2017-06-16 DIAGNOSIS — M5126 Other intervertebral disc displacement, lumbar region: Secondary | ICD-10-CM | POA: Diagnosis not present

## 2017-06-18 DIAGNOSIS — R159 Full incontinence of feces: Secondary | ICD-10-CM | POA: Diagnosis not present

## 2017-06-18 DIAGNOSIS — G44209 Tension-type headache, unspecified, not intractable: Secondary | ICD-10-CM | POA: Insufficient documentation

## 2017-06-18 DIAGNOSIS — G4700 Insomnia, unspecified: Secondary | ICD-10-CM | POA: Diagnosis not present

## 2017-06-18 DIAGNOSIS — G44211 Episodic tension-type headache, intractable: Secondary | ICD-10-CM | POA: Diagnosis not present

## 2017-06-18 DIAGNOSIS — Z0000 Encounter for general adult medical examination without abnormal findings: Secondary | ICD-10-CM | POA: Diagnosis not present

## 2017-06-18 LAB — HEPATIC FUNCTION PANEL
ALT: 13 (ref 7–35)
AST: 19 (ref 13–35)
Alkaline Phosphatase: 88 (ref 25–125)
BILIRUBIN, TOTAL: 0.7

## 2017-06-18 LAB — CBC AND DIFFERENTIAL
HEMATOCRIT: 37 (ref 36–46)
Hemoglobin: 12.9 (ref 12.0–16.0)
PLATELETS: 210 (ref 150–399)
WBC: 4.2

## 2017-06-18 LAB — BASIC METABOLIC PANEL
BUN: 12 (ref 4–21)
Creatinine: 0.8 (ref 0.5–1.1)
Glucose: 91
POTASSIUM: 3.9 (ref 3.4–5.3)
SODIUM: 144 (ref 137–147)

## 2017-06-18 LAB — LIPID PANEL
Cholesterol: 251 — AB (ref 0–200)
HDL: 90 — AB (ref 35–70)
LDL CALC: 148
TRIGLYCERIDES: 64 (ref 40–160)

## 2017-06-18 LAB — VITAMIN D 25 HYDROXY (VIT D DEFICIENCY, FRACTURES): Vit D, 25-Hydroxy: 38

## 2017-06-18 LAB — TSH: TSH: 5.04 (ref 0.41–5.90)

## 2017-06-27 DIAGNOSIS — R151 Fecal smearing: Secondary | ICD-10-CM | POA: Diagnosis not present

## 2017-07-03 ENCOUNTER — Other Ambulatory Visit: Payer: Self-pay | Admitting: Family Medicine

## 2017-07-03 MED FILL — GABAPENTIN 400 MG CAPSULE: 400 | 90 days supply | Qty: 90 | Fill #0

## 2017-07-03 MED FILL — VENLAFAXINE HCL ER 75 MG CA: 75 | 90 days supply | Qty: 90 | Fill #0

## 2017-07-04 ENCOUNTER — Other Ambulatory Visit: Payer: Self-pay

## 2017-07-04 DIAGNOSIS — L814 Other melanin hyperpigmentation: Secondary | ICD-10-CM | POA: Diagnosis not present

## 2017-07-04 DIAGNOSIS — D0439 Carcinoma in situ of skin of other parts of face: Secondary | ICD-10-CM | POA: Diagnosis not present

## 2017-07-04 DIAGNOSIS — C4492 Squamous cell carcinoma of skin, unspecified: Secondary | ICD-10-CM

## 2017-07-04 DIAGNOSIS — D229 Melanocytic nevi, unspecified: Secondary | ICD-10-CM | POA: Diagnosis not present

## 2017-07-04 DIAGNOSIS — D492 Neoplasm of unspecified behavior of bone, soft tissue, and skin: Secondary | ICD-10-CM | POA: Diagnosis not present

## 2017-07-04 HISTORY — DX: Squamous cell carcinoma of skin, unspecified: C44.92

## 2017-07-07 NOTE — Progress Notes (Signed)
[name] [place], [place] [address] Phone: [phone] Subjective:       CC: Foot and ankle pain.  [id number]  Sherry [name] is a 62 y.o. female coming in with complaint of foot and ankle pain.  Patient states that Thursday night her foot was sore. She said that during the night she couldn't let her left foot touch the inside of her right foot. On Friday she began to use crutches as she could not bear weight on it. She did have to work over the weekend 12 hours shifts. She notes that she had pitting edema over the weekend. She also wore a boot over the weekend. Patient does have history of stress fractures in both feet. Patient complains of constant, achy pain on the medial aspect of the foot over the tarsal bones. Has had sharp pain with weight bearing.     Past Medical History:  Diagnosis Date  . Complication of anesthesia   . Deviated septum   . Hepatitis    hx hep b 1984  . Hyperlipemia   . Hypothyroidism   . PONV (postoperative nausea and vomiting)   . Sinusitis, chronic    Past Surgical History:  Procedure Laterality Date  . ANTERIOR CRUCIATE LIGAMENT REPAIR  rt  . HAND SURGERY     lt  . HERPES SIMPLEX VIRUS DFA    . KNEE SURGERY Left   . NASAL SEPTOPLASTY W/ TURBINOPLASTY  08/30/2011   Procedure: NASAL SEPTOPLASTY WITH TURBINATE REDUCTION;  Surgeon: Jerrell Belfast, MD;  Location: [facility];  Service: ENT;  Laterality: N/A;  nasal septoplasty, bil. inferior turbinated reduction, bil. endoscopic concha bullosa and anterior ethmoid and maxillary antrostomy, ethmoidectomy  . NASAL SINUS SURGERY  08/30/2011   Procedure: ENDOSCOPIC SINUS SURGERY;  Surgeon: Jerrell Belfast, MD;  Location: [facility];  Service: ENT;  Laterality: N/A;  . TUBAL LIGATION     Social History   Socioeconomic History  . Marital status: Divorced    Spouse name: None  . Number of children: None  . Years of education:  None  . Highest education level: None  Social Needs  . Financial resource strain: None  . Food insecurity - worry: None  . Food insecurity - inability: None  . Transportation needs - medical: None  . Transportation needs - non-medical: None  Occupational History  . None  Tobacco Use  . Smoking status: Never Smoker  . Smokeless tobacco: Never Used  Substance and Sexual Activity  . Alcohol use: No  . Drug use: No  . Sexual activity: None  Other Topics Concern  . None  Social History Narrative  . None   No Known Allergies Family History  Problem Relation Age of Onset  . Leukemia Mother   . Cancer Father        lung     Past medical history, social, surgical and family history all reviewed in electronic medical record.  No pertanent information unless stated regarding to the chief complaint.   Review of Systems:Review of systems updated and as accurate as of 07/08/17  No headache, visual changes, nausea, vomiting, diarrhea, constipation, dizziness, abdominal pain, skin rash, fevers, chills, night sweats, weight loss, swollen lymph nodes,, chest pain, shortness of breath, mood changes.  Muscle aches and joint swelling  Objective  Blood pressure 122/72, pulse 77, height 5\' 7"  (1.702 m), weight 165 lb (74.8 kg), SpO2 99 %. Systems examined below as of 07/08/17  General: No apparent distress alert and oriented x3 mood and affect normal, dressed appropriately.  HEENT: Pupils equal, extraocular movements intact  Respiratory: Patient's speak in full sentences and does not appear short of breath  Cardiovascular: No lower extremity edema, non tender, no erythema  Skin: Warm dry intact with no signs of infection or rash on extremities or on axial skeleton.  Abdomen: Soft nontender  Neuro: Cranial nerves II through XII are intact, neurovascularly intact in all extremities with 2+ DTRs and 2+ pulses.  Lymph: No lymphadenopathy of posterior or anterior cervical chain or axillae  bilaterally.  Gait antalgic.  MSK:  Non tender with full range of motion and good stability and symmetric strength and tone of shoulders, elbows, wrist, hip, knee bilaterally.  Ankle: Left Severe pes planus with over pronation of the feet bilaterally left greater than right Degrees in all planes of range of motion Strength is 5/5 in all directions. Stable lateral and medial ligaments; squeeze test and kleiger test unremarkable; Talar dome nontender; No pain at base of 5th MT; No tenderness over cuboid; Tenderness over the navicular prominence. No tenderness on posterior aspects of lateral and medial malleolus No sign of peroneal tendon subluxations or tenderness to palpation Able to walk 4 steps. Contralateral ankle social mild arthritic changes with mild limited range of motion but no pain  MSK US performed of: Left ankle This study was ordered, performed, and interpreted by Charlann Boxer D.O.  Foot/Ankle:   All structures visualized.   Talar dome unremarkable  Ankle mortise without effusion. Peroneus longus and brevis tendons unremarkable on long and transverse views without sheath effusions. Posterior tibialis, flexor hallucis longus, and flexor digitorum longus tendons unremarkable on long and transverse views without sheath effusions. Achilles tendon visualized along length of tendon and unremarkable on long and transverse views without sheath effusion. Anterior Talofibular Ligament and Calcaneofibular Ligaments unremarkable and intact. Deltoid Ligament appears to have a partial rupture noted.. Navicular bone does have arthritic changes and appears to have a old callus formation noted.  IMPRESSION:  .  Deltoid ligament      Impression and Recommendations:     This case required medical decision making of moderate complexity.      Note: This dictation was prepared with Dragon dictation along with smaller phrase technology. Any transcriptional errors that result from this  process are unintentional.

## 2017-07-08 ENCOUNTER — Ambulatory Visit: Payer: 59 | Admitting: Family Medicine

## 2017-07-08 ENCOUNTER — Ambulatory Visit: Payer: Self-pay

## 2017-07-08 ENCOUNTER — Encounter: Payer: Self-pay | Admitting: Family Medicine

## 2017-07-08 ENCOUNTER — Ambulatory Visit (INDEPENDENT_AMBULATORY_CARE_PROVIDER_SITE_OTHER)
Admission: RE | Admit: 2017-07-08 | Discharge: 2017-07-08 | Disposition: A | Discharge status: Home / Self Care | Payer: 59 | Source: Ambulatory Visit | Attending: Family Medicine | Admitting: Family Medicine

## 2017-07-08 VITALS — BP 122/72 | HR 77 | Ht 67.0 in | Wt 165.0 lb

## 2017-07-08 DIAGNOSIS — M19072 Primary osteoarthritis, left ankle and foot: Secondary | ICD-10-CM | POA: Diagnosis not present

## 2017-07-08 DIAGNOSIS — M79671 Pain in right foot: Secondary | ICD-10-CM

## 2017-07-08 DIAGNOSIS — M79672 Pain in left foot: Secondary | ICD-10-CM | POA: Diagnosis not present

## 2017-07-08 DIAGNOSIS — S93422A Sprain of deltoid ligament of left ankle, initial encounter: Secondary | ICD-10-CM | POA: Diagnosis not present

## 2017-07-08 MED ORDER — VITAMIN D (ERGOCALCIFEROL) 1.25 MG (50000 UNIT) PO CAPS: 50000.0000 [IU] | ORAL_CAPSULE | ORAL | 0 refills | Status: DC

## 2017-07-08 MED ORDER — AMBULATORY NON FORMULARY MEDICATION: 0 refills | Status: DC

## 2017-07-08 MED ORDER — NITROGLYCERIN 0.2 MG/HR TD PT24: MEDICATED_PATCH | TRANSDERMAL | 1 refills | Status: DC

## 2017-07-08 MED FILL — NITROGLYCERIN 0.2 MG/HR PTC: 0.2 | 88 days supply | Qty: 22 | Fill #0

## 2017-07-08 MED FILL — VIT D2 1.25 MG (50,000 UNIT: 1.25 MG | 84 days supply | Qty: 12 | Fill #0

## 2017-07-08 NOTE — Assessment & Plan Note (Signed)
Deltoid ligament noted.  Has had a tear.  Patient put in a Cam walker, vitamin D, nitroglycerin again.  Had responded well to this previously.  X-rays ordered today to further evaluate the bony abnormality noted of the navicular bone.  Patient will follow up with me again in 2-3 weeks.  Note given for work to wear the boot on a regular basis.

## 2017-07-08 NOTE — Patient Instructions (Addendum)
Good to see you  Start the vitamin D again  I think this is more a tear in the [place] the boot at work and out of the house for 2 weeks.  OK to do shoes in the house.  No barefoot.  Start the nitro again  Nitroglycerin Protocol   Apply 1/4 nitroglycerin patch to affected area daily.  Change position of patch within the affected area every 24 hours.  You may experience a headache during the first 1-2 weeks of using the patch, these should subside.  If you experience headaches after beginning nitroglycerin patch treatment, you may take your preferred over the counter pain reliever.  Another side effect of the nitroglycerin patch is skin irritation or rash related to patch adhesive.  Please notify our office if you develop more severe headaches or rash, and stop the patch.  Tendon healing with nitroglycerin patch may require 12 to 24 weeks depending on the extent of injury.  Men should not use if taking Viagra, Cialis, or Levitra.   Do not use if you have migraines or rosacea. See me again in 2-3 weeks   Consider the test for the back. Could wait until we get the foot resolved.

## 2017-07-29 ENCOUNTER — Ambulatory Visit: Payer: Self-pay

## 2017-07-29 ENCOUNTER — Encounter: Payer: Self-pay | Admitting: Family Medicine

## 2017-07-29 ENCOUNTER — Ambulatory Visit: Payer: 59 | Admitting: Family Medicine

## 2017-07-29 VITALS — BP 144/70 | HR 74 | Ht 67.0 in

## 2017-07-29 DIAGNOSIS — M79672 Pain in left foot: Secondary | ICD-10-CM | POA: Diagnosis not present

## 2017-07-29 DIAGNOSIS — S93422A Sprain of deltoid ligament of left ankle, initial encounter: Secondary | ICD-10-CM | POA: Diagnosis not present

## 2017-07-29 NOTE — Assessment & Plan Note (Signed)
Making progress but we would continue to need to monitor the vitamin D levels as well as patient's navicular bone.  Continue Cam walker for another 2 weeks.  We discussed switching issues slowly over the course of a little time.  Icing regimen.  Follow-up with me again in 4 weeks

## 2017-07-29 NOTE — Patient Instructions (Signed)
Good to see you  Sherry [name] is your friend Sherry [name] another 2-3 weeks [place] in the house In a week then start the exercises See me again in 3 weeks

## 2017-07-29 NOTE — Progress Notes (Signed)
[facility] [place], [place] [address] Phone: [phone] Subjective:     CC: Ankle pain follow-up  [id number]  Sherry [name] is a 62 y.o. female coming in with complaint of ankle pain.  Was found to have a deltoid ligament rupture.  Patient was started on nitroglycerin and vitamin D and was to wear a rigid sole shoes.  Patient states 50% improvement.  Patient states that the swelling is significant walker regularly but in the house is change this into a shoe still some discomfort in the mornings but overall seems to be improving     Past Medical History:  Diagnosis Date  . Complication of anesthesia   . Deviated septum   . Hepatitis    hx hep b 1984  . Hyperlipemia   . Hypothyroidism   . PONV (postoperative nausea and vomiting)   . Sinusitis, chronic    Past Surgical History:  Procedure Laterality Date  . ANTERIOR CRUCIATE LIGAMENT REPAIR  rt  . HAND SURGERY     lt  . HERPES SIMPLEX VIRUS DFA    . KNEE SURGERY Left   . NASAL SEPTOPLASTY W/ TURBINOPLASTY  08/30/2011   Procedure: NASAL SEPTOPLASTY WITH TURBINATE REDUCTION;  Surgeon: Jerrell Belfast, MD;  Location: [facility];  Service: ENT;  Laterality: N/A;  nasal septoplasty, bil. inferior turbinated reduction, bil. endoscopic concha bullosa and anterior ethmoid and maxillary antrostomy, ethmoidectomy  . NASAL SINUS SURGERY  08/30/2011   Procedure: ENDOSCOPIC SINUS SURGERY;  Surgeon: Jerrell Belfast, MD;  Location: [facility];  Service: ENT;  Laterality: N/A;  . TUBAL LIGATION     Social History   Socioeconomic History  . Marital status: Divorced    Spouse name: None  . Number of children: None  . Years of education: None  . Highest education level: None  Social Needs  . Financial resource strain: None  . Food insecurity - worry: None  . Food insecurity - inability: None  . Transportation needs - medical: None  . Transportation needs  - non-medical: None  Occupational History  . None  Tobacco Use  . Smoking status: Never Smoker  . Smokeless tobacco: Never Used  Substance and Sexual Activity  . Alcohol use: No  . Drug use: No  . Sexual activity: None  Other Topics Concern  . None  Social History Narrative  . None   No Known Allergies Family History  Problem Relation Age of Onset  . Leukemia Mother   . Cancer Father        lung     Past medical history, social, surgical and family history all reviewed in electronic medical record.  No pertanent information unless stated regarding to the chief complaint.   Review of Systems:Review of systems updated and as accurate as of 07/29/17  No headache, visual changes, nausea, vomiting, diarrhea, constipation, dizziness, abdominal pain, skin rash, fevers, chills, night sweats, weight loss, swollen lymph nodes, body aches, joint swelling,  chest pain, shortness of breath, mood changes.  Positive muscle aches  Objective  Blood pressure (!) 144/70, pulse 74, height 5\' 7"  (1.702 m), SpO2 98 %. Systems examined below as of 07/29/17   General: No apparent distress alert and oriented x3 mood and affect normal, dressed appropriately.  HEENT: Pupils equal, extraocular movements intact  Respiratory: Patient's speak in full sentences and does not appear short of breath  Cardiovascular: No lower extremity edema, non tender, no erythema  Skin: Warm dry intact with no signs of infection or rash on extremities or on axial skeleton.  Abdomen: Soft nontender  Neuro: Cranial nerves II through XII are intact, neurovascularly intact in all extremities with 2+ DTRs and 2+ pulses.  Lymph: No lymphadenopathy of posterior or anterior cervical chain or axillae bilaterally.  Gait normal with good balance and coordination.  MSK:  Non tender with full range of motion and good stability and symmetric strength and tone of shoulders, elbows, wrist, hip, knee and bilaterally.  Left ankle shows the  patient does have pes planus of the foot.  Still tenderness over the navicular bone and the deltoid ligament.  No signal distally.  Mild tightness of the ankle in all planes but does have full passive range of motion.  Good peripheral pulses.  Limited musculoskeletal ultrasound was performed and interpreted by Lyndal Pulley  Limited ultrasound shows that patient does have possible cortical defect noted of the navicular bone.  Patient does have some hypoechoic changes.  Patient's deltoid ligament seems to have some scar tissue formation with increasing Doppler flow.  No new injuries otherwise  Impression: Interval healing of deltoid ligament rupture but patient does have calcific changes of the navicular prominence.     Impression and Recommendations:     This case required medical decision making of moderate complexity.      Note: This dictation was prepared with Dragon dictation along with smaller phrase technology. Any transcriptional errors that result from this process are unintentional.

## 2017-08-07 ENCOUNTER — Other Ambulatory Visit: Payer: Self-pay | Admitting: Dermatology

## 2017-08-07 DIAGNOSIS — Z1211 Encounter for screening for malignant neoplasm of colon: Secondary | ICD-10-CM | POA: Diagnosis not present

## 2017-08-07 DIAGNOSIS — C44321 Squamous cell carcinoma of skin of nose: Secondary | ICD-10-CM | POA: Diagnosis not present

## 2017-08-07 DIAGNOSIS — D485 Neoplasm of uncertain behavior of skin: Secondary | ICD-10-CM | POA: Diagnosis not present

## 2017-08-18 NOTE — Progress Notes (Signed)
[name] [place],  [address] Phone: [phone] Subjective:    CC: Ankle follow-up  [id number]  TEMPIE Sherry [name] is a 62 y.o. female coming in with complaint of left ankle pain.  Found to have a deltoid rupture. Has been doing fairly well but still he cam walker.  Patient states she is feeling 80% better.  Wearing shoes during the day but still wearing her cam walker at work.  Feels like she is making progress.  No new pains.      Past Medical History:  Diagnosis Date  . Complication of anesthesia   . Deviated septum   . Hepatitis    hx hep b 1984  . Hyperlipemia   . Hypothyroidism   . PONV (postoperative nausea and vomiting)   . Sinusitis, chronic    Past Surgical History:  Procedure Laterality Date  . ANTERIOR CRUCIATE LIGAMENT REPAIR  rt  . HAND SURGERY     lt  . HERPES SIMPLEX VIRUS DFA    . KNEE SURGERY Left   . NASAL SEPTOPLASTY W/ TURBINOPLASTY  08/30/2011   Procedure: NASAL SEPTOPLASTY WITH TURBINATE REDUCTION;  Surgeon: Jerrell Belfast, MD;  Location: [facility];  Service: ENT;  Laterality: N/A;  nasal septoplasty, bil. inferior turbinated reduction, bil. endoscopic concha bullosa and anterior ethmoid and maxillary antrostomy, ethmoidectomy  . NASAL SINUS SURGERY  08/30/2011   Procedure: ENDOSCOPIC SINUS SURGERY;  Surgeon: Jerrell Belfast, MD;  Location: [facility];  Service: ENT;  Laterality: N/A;  . TUBAL LIGATION     Social History   Socioeconomic History  . Marital status: Divorced    Spouse name: None  . Number of children: None  . Years of education: None  . Highest education level: None  Social Needs  . Financial resource strain: None  . Food insecurity - worry: None  . Food insecurity - inability: None  . Transportation needs - medical: None  . Transportation needs - non-medical: None  Occupational History  . None  Tobacco Use  . Smoking status: Never Smoker    . Smokeless tobacco: Never Used  Substance and Sexual Activity  . Alcohol use: No  . Drug use: No  . Sexual activity: None  Other Topics Concern  . None  Social History Narrative  . None   No Known Allergies Family History  Problem Relation Age of Onset  . Leukemia Mother   . Cancer Father        lung     Past medical history, social, surgical and family history all reviewed in electronic medical record.  No pertanent information unless stated regarding to the chief complaint.   Review of Systems:Review of systems updated and as accurate as of 08/19/17  No headache, visual changes, nausea, vomiting, diarrhea, constipation, dizziness, abdominal pain, skin rash, fevers, chills, night sweats, weight loss, swollen lymph nodes, body aches, joint swelling,  chest pain, shortness of breath, mood changes.  Positive muscle aches  Objective  Blood pressure 134/80, pulse 72, height 5\' 7"  (1.702 m), SpO2 96 %. Systems examined below as of 08/19/17   General: No apparent distress alert and oriented x3 mood and affect normal, dressed appropriately.  HEENT: Pupils equal, extraocular movements intact  Respiratory: Patient's speak in full sentences and does not appear short of breath  Cardiovascular: No lower extremity edema, non tender, no erythema  Skin: Warm dry intact with no signs of infection or rash on extremities or on  axial skeleton.  Abdomen: Soft nontender  Neuro: Cranial nerves II through XII are intact, neurovascularly intact in all extremities with 2+ DTRs and 2+ pulses.  Lymph: No lymphadenopathy of posterior or anterior cervical chain or axillae bilaterally.  Gait normal with good balance and coordination.  MSK:  Non tender with full range of motion and good stability and symmetric strength and tone of shoulders, elbows, wrist, hip, knee and ankles bilaterally.   Left foot shows the patient does have some mild tenderness over the deltoid ligament but significantly improved.  No  breakdown of the arch.  Patient does have breakdown the transverse arch the bilaterally.      Impression and Recommendations:     This case required medical decision making of moderate complexity.      Note: This dictation was prepared with Dragon dictation along with smaller phrase technology. Any transcriptional errors that result from this process are unintentional.

## 2017-08-19 ENCOUNTER — Ambulatory Visit: Payer: 59 | Admitting: Family Medicine

## 2017-08-19 ENCOUNTER — Encounter: Payer: Self-pay | Admitting: Family Medicine

## 2017-08-19 DIAGNOSIS — S93422A Sprain of deltoid ligament of left ankle, initial encounter: Secondary | ICD-10-CM | POA: Diagnosis not present

## 2017-08-19 NOTE — Assessment & Plan Note (Signed)
Significant improvement at this time.  Discussed icing regimen and home exercises.  Discussed which activities to do.  Given titration off of the nitroglycerin.  Continue the Effexor for the back pain.  Follow-up again in 6 weeks

## 2017-08-19 NOTE — Patient Instructions (Signed)
Good to see you  Sherry [name] is your friend.  I think try to start the day without the boot and start 4 hours at work and increase 30 minutes a day  Continue the nitro another 3 weeks daily then 3 times a week for 2 weeks then stop OK to increase activity if you want.  Do not lace the eye near the toes on the right foot.  See me again in 6 weeks  Write me sooner if you need aan injection in the back

## 2017-09-02 DIAGNOSIS — R1310 Dysphagia, unspecified: Secondary | ICD-10-CM | POA: Diagnosis not present

## 2017-09-02 DIAGNOSIS — K219 Gastro-esophageal reflux disease without esophagitis: Secondary | ICD-10-CM | POA: Diagnosis not present

## 2017-09-02 DIAGNOSIS — K739 Chronic hepatitis, unspecified: Secondary | ICD-10-CM | POA: Diagnosis not present

## 2017-09-02 DIAGNOSIS — Z1211 Encounter for screening for malignant neoplasm of colon: Secondary | ICD-10-CM | POA: Diagnosis not present

## 2017-09-04 DIAGNOSIS — K739 Chronic hepatitis, unspecified: Secondary | ICD-10-CM | POA: Diagnosis not present

## 2017-09-17 ENCOUNTER — Encounter: Payer: Self-pay | Admitting: Family Medicine

## 2017-09-17 ENCOUNTER — Encounter: Payer: Self-pay | Admitting: Gastroenterology

## 2017-09-17 DIAGNOSIS — Z1211 Encounter for screening for malignant neoplasm of colon: Secondary | ICD-10-CM | POA: Diagnosis not present

## 2017-09-17 DIAGNOSIS — R1310 Dysphagia, unspecified: Secondary | ICD-10-CM | POA: Diagnosis not present

## 2017-09-17 DIAGNOSIS — K635 Polyp of colon: Secondary | ICD-10-CM | POA: Diagnosis not present

## 2017-09-17 DIAGNOSIS — D125 Benign neoplasm of sigmoid colon: Secondary | ICD-10-CM | POA: Diagnosis not present

## 2017-09-17 DIAGNOSIS — K219 Gastro-esophageal reflux disease without esophagitis: Secondary | ICD-10-CM | POA: Diagnosis not present

## 2017-09-19 ENCOUNTER — Ambulatory Visit: Payer: 59 | Admitting: Family Medicine

## 2017-09-19 ENCOUNTER — Encounter: Payer: Self-pay | Admitting: Family Medicine

## 2017-09-19 VITALS — BP 134/76 | HR 83 | Temp 97.7°F | Ht 67.0 in | Wt 172.0 lb

## 2017-09-19 DIAGNOSIS — F5104 Psychophysiologic insomnia: Secondary | ICD-10-CM | POA: Diagnosis not present

## 2017-09-19 DIAGNOSIS — R1310 Dysphagia, unspecified: Secondary | ICD-10-CM | POA: Diagnosis not present

## 2017-09-19 DIAGNOSIS — Z1231 Encounter for screening mammogram for malignant neoplasm of breast: Secondary | ICD-10-CM | POA: Diagnosis not present

## 2017-09-19 DIAGNOSIS — F341 Dysthymic disorder: Secondary | ICD-10-CM

## 2017-09-19 DIAGNOSIS — B181 Chronic viral hepatitis B without delta-agent: Secondary | ICD-10-CM | POA: Diagnosis not present

## 2017-09-19 DIAGNOSIS — F329 Major depressive disorder, single episode, unspecified: Secondary | ICD-10-CM | POA: Insufficient documentation

## 2017-09-19 DIAGNOSIS — M1990 Unspecified osteoarthritis, unspecified site: Secondary | ICD-10-CM

## 2017-09-19 DIAGNOSIS — K219 Gastro-esophageal reflux disease without esophagitis: Secondary | ICD-10-CM | POA: Diagnosis not present

## 2017-09-19 DIAGNOSIS — M722 Plantar fascial fibromatosis: Secondary | ICD-10-CM | POA: Insufficient documentation

## 2017-09-19 DIAGNOSIS — E559 Vitamin D deficiency, unspecified: Secondary | ICD-10-CM | POA: Insufficient documentation

## 2017-09-19 DIAGNOSIS — M2480 Other specific joint derangements of unspecified joint, not elsewhere classified: Secondary | ICD-10-CM | POA: Diagnosis not present

## 2017-09-19 DIAGNOSIS — F4310 Post-traumatic stress disorder, unspecified: Secondary | ICD-10-CM | POA: Diagnosis not present

## 2017-09-19 DIAGNOSIS — Z1239 Encounter for other screening for malignant neoplasm of breast: Secondary | ICD-10-CM

## 2017-09-19 DIAGNOSIS — K589 Irritable bowel syndrome without diarrhea: Secondary | ICD-10-CM | POA: Diagnosis not present

## 2017-09-19 DIAGNOSIS — J301 Allergic rhinitis due to pollen: Secondary | ICD-10-CM

## 2017-09-19 DIAGNOSIS — M8580 Other specified disorders of bone density and structure, unspecified site: Secondary | ICD-10-CM

## 2017-09-19 DIAGNOSIS — M84374G Stress fracture, right foot, subsequent encounter for fracture with delayed healing: Secondary | ICD-10-CM | POA: Insufficient documentation

## 2017-09-19 DIAGNOSIS — B1910 Unspecified viral hepatitis B without hepatic coma: Secondary | ICD-10-CM | POA: Insufficient documentation

## 2017-09-19 DIAGNOSIS — F32A Depression, unspecified: Secondary | ICD-10-CM | POA: Insufficient documentation

## 2017-09-19 NOTE — Progress Notes (Signed)
Sherry [name] [id number] is a 62 y.o. female is here to [facility].   Patient Care Team: Briscoe Deutscher, DO as PCP - General (Family Medicine) Juanita Craver, MD as Consulting Physician (Gastroenterology) Jerrell Belfast, MD as Consulting Physician (Otolaryngology)   History of Present Illness:   Sherry [name], CMA acting as scribe for Dr. Briscoe Deutscher.   HPI: See Assessment and Plan section for Problem Based Charting of issues discussed today.   Health Maintenance Due  Topic Date Due  . Hepatitis C Screening  [birth year]  . HIV Screening  06/03/1969  . TETANUS/TDAP  06/03/1973  . PAP SMEAR  06/04/1975  . COLONOSCOPY  06/03/2004  . MAMMOGRAM  12/24/2015   Depression screen PHQ 2/9 09/19/2017  Decreased Interest 0  Down, Depressed, Hopeless 0  PHQ - 2 Score 0  Altered sleeping 0  Tired, decreased energy 3  Change in appetite 0  Feeling bad or failure about yourself  0  Trouble concentrating 0  Moving slowly or fidgety/restless 0  Suicidal thoughts 0  PHQ-9 Score 3  Difficult doing work/chores Not difficult at all   PMHx, SurgHx, SocialHx, Medications, and Allergies were reviewed in the Visit Navigator and updated as appropriate.   Past Medical History:  Diagnosis Date  . Deviated septum   . Hepatitis    hx hep b 1984  . Hyperlipemia   . Hypothyroidism   . PONV (postoperative nausea and vomiting)   . Sinusitis, chronic   UR Past Surgical History:  Procedure Laterality Date  . ANTERIOR CRUCIATE LIGAMENT REPAIR Right   . HAND SURGERY Left   . HERPES SIMPLEX VIRUS DFA    . KNEE SURGERY Left   . NASAL SEPTOPLASTY W/ TURBINOPLASTY  08/30/2011   Procedure: NASAL SEPTOPLASTY WITH TURBINATE REDUCTION;  Surgeon: Jerrell Belfast, MD;  Location: [place];  Service: ENT;  Laterality: N/A;  nasal septoplasty, bil. inferior turbinated reduction, bil. endoscopic concha bullosa and anterior ethmoid and maxillary antrostomy, ethmoidectomy  . NASAL SINUS SURGERY   08/30/2011   Procedure: ENDOSCOPIC SINUS SURGERY;  Surgeon: Jerrell Belfast, MD;  Location: [place];  Service: ENT;  Laterality: N/A;  . TUBAL LIGATION    BINOPLASTY  08/30/2011   Procedure: NASAL SEPTOPLASTY WITH TURBINATE REDUCTION;  Surgeon: Jerrell Belfast, MD;  Location: [place];  Service: ENT;  Laterality: N/A;  nasal septoplasty, bil. inferior turbinated reduction, bil. endoscopic concha bullosa and anterior ethmoid and maxillary antrostomy, ethmoidectomy  . NASAL SINUS SURGERY  08/30/2011   Procedure: ENDOSCOPIC SINUS SURGERY;  Surgeon: Jerrell Belfast, MD;  Location: [place];  Service: ENT;  Laterality: N/A;  . TUBAL LIGATION      Problem Relation Age of Onset  . Leukemia Mother   . Lung cancer Father    Social History   Tobacco Use  . Smoking status: Never Smoker  . Smokeless tobacco: Never Used  Substance Use Topics  . Alcohol use: No  . Drug use: No    Current Medications and Allergies:   .  AMBULATORY NON FORMULARY MEDICATION, Medication Name: orthopaedic shoes.  For chronic back pain, Disp: 2 Device, Rfl: 0 .  AMBULATORY NON FORMULARY MEDICATION, Even up shoe balancer., Disp: 1 each, Rfl: 0 .  Cholecalciferol (VITAMIN D PO), Take by mouth. 10,000 IU daily, Disp: , Rfl:  .  Diclofenac Sodium (PENNSAID) 2 % SOLN, Place 2 application onto the skin 2 (two) times daily., Disp: 112 g, Rfl: 3 .  gabapentin (NEURONTIN)  400 MG capsule, TAKE 1 CAPSULE (400 MG TOTAL) BY MOUTH AT BEDTIME., Disp: 90 capsule, Rfl: 1 .  nitroGLYCERIN (NITRODUR - DOSED IN MG/24 HR) 0.2 mg/hr patch, 1/4 patch daily, Disp: 30 patch, Rfl: 1 .  Turmeric 500 MG CAPS, Take 1 capsule by mouth daily., Disp: , Rfl:  .  venlafaxine XR (EFFEXOR-XR) 75 MG 24 hr capsule, TAKE 1 CAPSULE (75 MG TOTAL) BY MOUTH DAILY WITH BREAKFAST., Disp: 90 capsule, Rfl: 1 .  Vitamin D, Ergocalciferol, (DRISDOL) 50000 units CAPS capsule, Take 1 capsule (50,000 Units total) by mouth  every 7 (seven) days., Disp: 12 capsule, Rfl: 0 .  zolpidem (AMBIEN) 10 MG tablet, Take 10 mg by mouth as needed., Disp: , Rfl:   No Known Allergies   Review of Systems:   Pertinent items are noted in the HPI. Otherwise, ROS is negative.  Vitals:   Vitals:   09/19/17 1409  BP: 134/76  Pulse: 83  Temp: 97.7 F (36.5 C)  TempSrc: Oral  SpO2: 96%  Weight: 172 lb (78 kg)  Height: 5\' 7"  (1.702 m)     Body mass index is 26.94 kg/m.  Physical Exam:   Physical Exam  Constitutional: She appears well-developed and well-nourished. No distress.  HENT:  Head: Normocephalic and atraumatic.  Eyes: Pupils are equal, round, and reactive to light. EOM are normal.  Neck: Normal range of motion. Neck supple.  Cardiovascular: Normal rate, regular rhythm, normal heart sounds and intact distal pulses.  Pulmonary/Chest: Effort normal.  Abdominal: Soft.  Skin: Skin is warm.  Psychiatric: She has a normal mood and affect. Her behavior is normal.  Nursing note and vitals reviewed.  Assessment and Plan:   Patient Active Problem List   Diagnosis Date Noted  . Generalized hypermobility of joints 09/22/2017  . PTSD (post-traumatic stress disorder) 09/22/2017  . Chronic hepatitis 09/19/2017  . Stress fracture, right foot, subsequent encounter for fracture with delayed healing 09/19/2017  . Acid reflux 09/19/2017  . Dysphagia 09/19/2017  . Arthritis 09/19/2017  . IBS (irritable bowel syndrome) 09/19/2017  . Depression 09/19/2017  . Vitamin D deficiency 09/19/2017  . Plantar fasciitis 09/19/2017  . Tear of deltoid ligament of ankle, left, initial encounter 07/08/2017  . Tension type headache, prn Fioricet 06/18/2017  . Lumbar radiculopathy, right 08/19/2016  . Trigger point of right shoulder region 04/22/2016  . Slipped rib syndrome 04/22/2016  . Nonallopathic lesion of rib cage 04/22/2016  . Nonallopathic lesion of cervical region 04/22/2016  . Nonallopathic lesion of lumbosacral region  04/22/2016  . Nonallopathic lesion of thoracic region 04/22/2016  . Piriformis syndrome of right side 04/22/2016  . Osteopenia 11/01/2015  . Left navicular fracture of foot 10/11/2015  . High risk medication use 09/20/2014  . Tibialis posterior tendinitis 12/14/2013  . Stress reaction of foot with delayed healing 12/14/2013  . De Quervain's tenosynovitis, right 12/14/2013  . Herpes simplex 03/26/2012  . Sinusitis, chronic 08/30/2011  . Deviated nasal septum 08/30/2011  . SNORING 10/11/2008  . Allergic rhinitis 06/08/2007  . Insomnia, psychophsiologic 06/08/2007   1. Screening for breast cancer   2. Chronic viral hepatitis B without delta agent and without coma (HCC)   3. Gastroesophageal reflux disease without esophagitis   4. Dysphagia, unspecified type   5. Arthritis   6. Irritable bowel syndrome, unspecified type   7. Dysthymia   8. Vitamin D deficiency   9. Plantar fasciitis   10. Generalized hypermobility of joints   11. PTSD (post-traumatic stress disorder)  12. Psychophysiological insomnia   13. Seasonal allergic rhinitis due to pollen   14. Osteopenia, unspecified location    Allergic rhinitis No concerns today.  Chronic hepatitis Followed by gastroenterology.  History of hepatitis B that was diagnosed in 1984.  PTSD (post-traumatic stress disorder) Due to childhood trauma.  Patient considers herself strong.  She does take Effexor daily.  She sometimes takes Ambien at night for sleep.  She is interested in counseling.  Osteopenia Taking vitamin D and calcium.  We will continue to monitor.  Generalized hypermobility of joints This leads to much of her joint pain.  She does take to marked daily.  She also uses Pennsaid as needed.  Orders Placed This Encounter  Procedures  . MM SCREENING BREAST TOMO BILATERAL   . Reviewed expectations re: course of current medical issues. . Discussed self-management of symptoms. . Outlined signs and symptoms indicating need  for more acute intervention. . Patient verbalized understanding and all questions were answered. Marland Kitchen Health Maintenance issues including appropriate healthy diet, exercise, and smoking avoidance were discussed with patient. . See orders for this visit as documented in the electronic medical record. . Patient received an After Visit Summary.  CMA served as Education administrator during this visit. History, Physical, and Plan performed by medical provider. The above documentation has been reviewed and is accurate and complete. Briscoe Deutscher, D.O.  Briscoe Deutscher, DO [place] [place] 10/03/2017  Records requested if needed. Time spent with the patient: 40 minutes, of which >50% was spent in obtaining information about her symptoms, reviewing her previous labs, evaluations, and treatments, counseling her about her condition (please see the discussed topics above), and developing a plan to further investigate it; she had a number of questions which I addressed.

## 2017-09-22 DIAGNOSIS — M2480 Other specific joint derangements of unspecified joint, not elsewhere classified: Secondary | ICD-10-CM | POA: Insufficient documentation

## 2017-09-22 DIAGNOSIS — F4310 Post-traumatic stress disorder, unspecified: Secondary | ICD-10-CM | POA: Insufficient documentation

## 2017-09-29 NOTE — Progress Notes (Signed)
[name] [address], [place] [address] Phone: [phone] Subjective:    I'm seeing this patient by the request  of:    CC: Foot pain follow-up  [id number]  Sherry [name] is a 62 y.o. female coming in with complaint of left foot pain.  Patient was seen previously and had a deltoid ligament strain.  Patient was started on nitroglycerin as well as vitamin D.  Patient was swollen slow healing and continue the Cam walker.  Patient states even with conservative therapy she feels like she is worsening at this time.  Having more pain.  States that it is fairly severe.  Unable to walk without significant amount of discomfort barefoot.  Continues to work 312-hour shifts a week and usually wears the boot     Past Medical History:  Diagnosis Date  . Deviated septum   . Hepatitis    hx hep b 1984  . Hyperlipemia   . Hypothyroidism   . PONV (postoperative nausea and vomiting)   . Sinusitis, chronic    Past Surgical History:  Procedure Laterality Date  . ANTERIOR CRUCIATE LIGAMENT REPAIR Right   . HAND SURGERY Left   . HERPES SIMPLEX VIRUS DFA    . KNEE SURGERY Left   . NASAL SEPTOPLASTY W/ TURBINOPLASTY  08/30/2011   Procedure: NASAL SEPTOPLASTY WITH TURBINATE REDUCTION;  Surgeon: Jerrell Belfast, MD;  Location: [place];  Service: ENT;  Laterality: N/A;  nasal septoplasty, bil. inferior turbinated reduction, bil. endoscopic concha bullosa and anterior ethmoid and maxillary antrostomy, ethmoidectomy  . NASAL SINUS SURGERY  08/30/2011   Procedure: ENDOSCOPIC SINUS SURGERY;  Surgeon: Jerrell Belfast, MD;  Location: [place];  Service: ENT;  Laterality: N/A;  . TUBAL LIGATION     Social History   Socioeconomic History  . Marital status: Divorced    Spouse name: Not on file  . Number of children: Not on file  . Years of education: Not on file  . Highest education level: Not on file  Occupational History   Employer: [employer]  . Financial resource strain: Not on file  . Food insecurity:    Worry: Not on file    Inability: Not on file  . Transportation needs:    Medical: Not on file    Non-medical: Not on file  Tobacco Use  . Smoking status: Never Smoker  . Smokeless tobacco: Never Used  Substance and Sexual Activity  . Alcohol use: No  . Drug use: No  . Sexual activity: Not on file  Lifestyle  . Physical activity:    Days per week: Not on file    Minutes per session: Not on file  . Stress: Not on file  Relationships  . Social connections:    Talks on phone: Not on file    Gets together: Not on file    Attends religious service: Not on file    Active member of club or organization: Not on file    Attends meetings of clubs or organizations: Not on file    Relationship status: Not on file  Other Topics Concern  . Not on file  Social History Narrative  . Not on file   No Known Allergies Family History  Problem Relation Age of Onset  . Leukemia Mother   . Lung cancer Father      Past medical history, social, surgical and family history all reviewed in electronic medical record.  No pertanent information unless stated regarding to the chief complaint.   Review of Systems:Review of systems updated and as accurate as of 09/30/17  No headache, visual changes, nausea, vomiting, diarrhea, constipation, dizziness, abdominal pain, skin rash, fevers, chills, night sweats, weight loss, swollen lymph nodes, body aches, joint swelling, muscle aches, chest pain, shortness of breath, mood changes.   Objective  Blood pressure 140/62, pulse 84, height 5\' 7"  (1.702 m), SpO2 97 %. Systems examined below as of 09/30/17   General: No apparent distress alert and oriented x3 mood and affect normal, dressed appropriately.  HEENT: Pupils equal, extraocular movements intact  Respiratory: Patient's speak in full sentences and does not appear short of breath  Cardiovascular:  No lower extremity edema, non tender, no erythema  Skin: Warm dry intact with no signs of infection or rash on extremities or on axial skeleton.  Abdomen: Soft nontender  Neuro: Cranial nerves II through XII are intact, neurovascularly intact in all extremities with 2+ DTRs and 2+ pulses.  Lymph: No lymphadenopathy of posterior or anterior cervical chain or axillae bilaterally.  Gait gait MSK:  Non tender with full range of motion and good stability and symmetric strength and tone of shoulders, elbows, wrist, hip, knee and ankles bilaterally.  Increased swelling over the navicular bone with increasing discomfort and pain.  Full range of motion of the ankle.  Negative tarsal tunnel.  Significant less pain over the deltoid ligament from previous exam.  Mild to moderate osteoarthritic changes with breakdown of the transverse arch.  Limited musculoskeletal ultrasound of patient's foot and ankle was performed and interpreted by Lyndal Pulley  Did ultrasound shows the patient now has a cortical defect of the navicular bone with significant increase in Doppler flow and surrounding hypoechoic changes consistent with a stress fracture. Impression: New navicular stress fracture.    Impression and Recommendations:     This case required medical decision making of moderate complexity.      Note: This dictation was prepared with Dragon dictation along with smaller phrase technology. Any transcriptional errors that result from this process are unintentional.

## 2017-09-30 ENCOUNTER — Ambulatory Visit: Payer: 59 | Admitting: Family Medicine

## 2017-09-30 ENCOUNTER — Ambulatory Visit: Payer: Self-pay

## 2017-09-30 ENCOUNTER — Encounter: Payer: Self-pay | Admitting: Family Medicine

## 2017-09-30 VITALS — BP 140/62 | HR 84 | Ht 67.0 in

## 2017-09-30 DIAGNOSIS — G8929 Other chronic pain: Secondary | ICD-10-CM | POA: Diagnosis not present

## 2017-09-30 DIAGNOSIS — M25571 Pain in right ankle and joints of right foot: Secondary | ICD-10-CM

## 2017-09-30 DIAGNOSIS — S92255S Nondisplaced fracture of navicular [scaphoid] of left foot, sequela: Secondary | ICD-10-CM | POA: Diagnosis not present

## 2017-09-30 MED ORDER — VITAMIN D (ERGOCALCIFEROL) 1.25 MG (50000 UNIT) PO CAPS: 50000.0000 [IU] | ORAL_CAPSULE | ORAL | 0 refills | Status: DC

## 2017-09-30 MED FILL — VENLAFAXINE HCL ER 75 MG CA: 75 | 90 days supply | Qty: 90 | Fill #1

## 2017-09-30 MED FILL — GABAPENTIN 400 MG CAPSULE: 400 | 90 days supply | Qty: 90 | Fill #1

## 2017-09-30 NOTE — Assessment & Plan Note (Signed)
Recurrent.  Seen one last year.  Started vitamin D and K2.  Continue the gabapentin and the Effexor.  Concerning that patient had this occur when she was in the boot but patient states that she did have a secondary injury.  Patient states that she would decline pain nonweightbearing for a while.  Patient will follow up with me again in 3 weeks

## 2017-09-30 NOTE — Patient Instructions (Signed)
Good to see you  I am sorry you are hurting and now looks to have a navicular fracture Continue the boot.  Add K2 daily to the regimen Refilled the vitamin D  See me again in 4 weeks

## 2017-10-03 ENCOUNTER — Encounter: Payer: Self-pay | Admitting: Physical Therapy

## 2017-10-03 NOTE — Assessment & Plan Note (Signed)
Taking vitamin D and calcium.  We will continue to monitor.

## 2017-10-03 NOTE — Assessment & Plan Note (Signed)
Due to childhood trauma.  Patient considers herself strong.  She does take Effexor daily.  She sometimes takes Ambien at night for sleep.  She is interested in counseling.

## 2017-10-03 NOTE — Assessment & Plan Note (Signed)
Followed by gastroenterology.  History of hepatitis B that was diagnosed in 1984.

## 2017-10-03 NOTE — Assessment & Plan Note (Signed)
This leads to much of her joint pain.  She does take to marked daily.  She also uses Pennsaid as needed.

## 2017-10-03 NOTE — Assessment & Plan Note (Signed)
No concerns today 

## 2017-10-09 DIAGNOSIS — D369 Benign neoplasm, unspecified site: Secondary | ICD-10-CM

## 2017-10-09 HISTORY — DX: Benign neoplasm, unspecified site: D36.9

## 2017-10-30 ENCOUNTER — Ambulatory Visit: Payer: 59 | Admitting: Family Medicine

## 2017-10-30 ENCOUNTER — Encounter: Payer: Self-pay | Admitting: Family Medicine

## 2017-10-30 DIAGNOSIS — M79605 Pain in left leg: Secondary | ICD-10-CM

## 2017-10-30 DIAGNOSIS — S92255S Nondisplaced fracture of navicular [scaphoid] of left foot, sequela: Secondary | ICD-10-CM

## 2017-10-30 MED ORDER — FLUTICASONE PROPIONATE 50 MCG/ACT NA SUSP: 2.0000 | Freq: Every day | NASAL | 6 refills | Status: DC

## 2017-10-30 MED ORDER — AZELASTINE HCL 0.1 % NA SOLN: 1.0000 | Freq: Two times a day (BID) | NASAL | 12 refills | Status: DC

## 2017-10-30 NOTE — Patient Instructions (Signed)
Good to see you  We will call you on the orthotics.  Finish the nitro with every other day  Ok to wear a shoe Finish the K2 bottle and then discontinue Make an appointment with me in 4 weeks just in case

## 2017-10-30 NOTE — Assessment & Plan Note (Signed)
Patient is improved significantly.  Transition into the shoe.  Discussed icing regimen.  Continue the nitroglycerin and titrate off over the course the next several days.  Continue the once weekly vitamin D.  Follow-up again in 6 weeks

## 2017-10-30 NOTE — Progress Notes (Signed)
[name] [address], [place] [address] Phone: [phone] Subjective:     CC: Left ankle pain  [id number]  Sherry [name] is a 62 y.o. female coming in with complaint of left ankle pain. She has been wearing the walking boot since we last saw her. She feels that she is doing better. She has pain only when bearing weight for prolonged periods times.  Patient was seen previously does have more of a navicular stress fracture.  Patient has been doing much better and states that she is about 90% better.  I have any more the swelling that she was having previously.       Past Medical History:  Diagnosis Date  . Deviated septum   . Hepatitis    hx hep b 1984  . Hyperlipemia   . Hypothyroidism   . PONV (postoperative nausea and vomiting)   . Sinusitis, chronic   . Tubular adenoma 10/09/2017   No high-grade dysplasia or malignancy    Past Surgical History:  Procedure Laterality Date  . ANTERIOR CRUCIATE LIGAMENT REPAIR Right   . HAND SURGERY Left   . HERPES SIMPLEX VIRUS DFA    . KNEE SURGERY Left   . NASAL SEPTOPLASTY W/ TURBINOPLASTY  08/30/2011   Procedure: NASAL SEPTOPLASTY WITH TURBINATE REDUCTION;  Surgeon: Jerrell Belfast, MD;  Location: [facility];  Service: ENT;  Laterality: N/A;  nasal septoplasty, bil. inferior turbinated reduction, bil. endoscopic concha bullosa and anterior ethmoid and maxillary antrostomy, ethmoidectomy  . NASAL SINUS SURGERY  08/30/2011   Procedure: ENDOSCOPIC SINUS SURGERY;  Surgeon: Jerrell Belfast, MD;  Location: [facility];  Service: ENT;  Laterality: N/A;  . TUBAL LIGATION     Social History   Socioeconomic History  . Marital status: Divorced    Spouse name: Not on file  . Number of children: Not on file  . Years of education: Not on file  . Highest education level: Not on file  Occupational History    Employer: [employer]  . Financial resource  strain: Not on file  . Food insecurity:    Worry: Not on file    Inability: Not on file  . Transportation needs:    Medical: Not on file    Non-medical: Not on file  Tobacco Use  . Smoking status: Never Smoker  . Smokeless tobacco: Never Used  Substance and Sexual Activity  . Alcohol use: No  . Drug use: No  . Sexual activity: Not on file  Lifestyle  . Physical activity:    Days per week: Not on file    Minutes per session: Not on file  . Stress: Not on file  Relationships  . Social connections:    Talks on phone: Not on file    Gets together: Not on file    Attends religious service: Not on file    Active member of club or organization: Not on file    Attends meetings of clubs or organizations: Not on file    Relationship status: Not on file  Other Topics Concern  . Not on file  Social History Narrative  . Not on file   No Known Allergies Family History  Problem Relation Age of Onset  . Leukemia Mother   . Lung cancer Father      Past medical history, social, surgical and family history all reviewed in electronic medical record.  No pertanent information unless stated regarding to  the chief complaint.   Review of Systems:Review of systems updated and as accurate as of 10/30/17  No headache, visual changes, nausea, vomiting, diarrhea, constipation, dizziness, abdominal pain, skin rash, fevers, chills, night sweats, weight loss, swollen lymph nodes, body aches, joint swelling, muscle aches, chest pain, shortness of breath, mood changes.   Objective  There were no vitals taken for this visit. Systems examined below as of 10/30/17   General: No apparent distress alert and oriented x3 mood and affect normal, dressed appropriately.  HEENT: Pupils equal, extraocular movements intact  Respiratory: Patient's speak in full sentences and does not appear short of breath  Cardiovascular: No lower extremity edema, non tender, no erythema  Skin: Warm dry intact with no signs of  infection or rash on extremities or on axial skeleton.  Abdomen: Soft nontender  Neuro: Cranial nerves II through XII are intact, neurovascularly intact in all extremities with 2+ DTRs and 2+ pulses.  Lymph: No lymphadenopathy of posterior or anterior cervical chain or axillae bilaterally.  Gait normal with good balance and coordination.  MSK:  Non tender with full range of motion and good stability and symmetric strength and tone of shoulders, elbows, wrist, hip, knee bilaterally.  Mild arthritic changes of multiple joints Single systems the patient is no longer tender over the navicular bone.  Patient still has significant breakdown of the longitudinal and transverse arch.  Medial pronation noted.     Impression and Recommendations:     This case required medical decision making of moderate complexity.      Note: This dictation was prepared with Dragon dictation along with smaller phrase technology. Any transcriptional errors that result from this process are unintentional.

## 2017-11-10 ENCOUNTER — Encounter: Payer: Self-pay | Admitting: Family Medicine

## 2017-11-10 ENCOUNTER — Ambulatory Visit: Payer: 59 | Admitting: Family Medicine

## 2017-11-10 DIAGNOSIS — S92255S Nondisplaced fracture of navicular [scaphoid] of left foot, sequela: Secondary | ICD-10-CM

## 2017-11-10 DIAGNOSIS — S92255D Nondisplaced fracture of navicular [scaphoid] of left foot, subsequent encounter for fracture with routine healing: Secondary | ICD-10-CM

## 2017-11-10 NOTE — Progress Notes (Signed)
Sherry [name] - 62 y.o. female MRN [id number]  Date of birth: 06-24-1956  SUBJECTIVE:  Including CC & ROS.  Chief Complaint  Patient presents with  . Foot Pain    Patient is here today for orthotics.  She has been seeing Dr. Tamala Julian for closed nondisplaced fracture of navicular bone of left foot, tear of deltoid ligament of left ankle and has a H/O stress fractures in both feet.  She has tried several different brands of OTC orthotics and different shoes.     Sherry [name] is a 62 y.o. female that is presenting for orthotics as she has a history of a left navicular stress fracture in her foot.  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    Review of Systems  HISTORY: Past Medical, Surgical, Social, and Family History Reviewed & Updated per EMR.   Pertinent Historical Findings include:  Past Medical History:  Diagnosis Date  . Deviated septum   . Hepatitis    hx hep b 1984  . Hyperlipemia   . Hypothyroidism   . PONV (postoperative nausea and vomiting)   . Sinusitis, chronic   . Tubular adenoma 10/09/2017   No high-grade dysplasia or malignancy     Past Surgical History:  Procedure Laterality Date  . ANTERIOR CRUCIATE LIGAMENT REPAIR Right   . HAND SURGERY Left   . HERPES SIMPLEX VIRUS DFA    . KNEE SURGERY Left   . NASAL SEPTOPLASTY W/ TURBINOPLASTY  08/30/2011   Procedure: NASAL SEPTOPLASTY WITH TURBINATE REDUCTION;  Surgeon: Jerrell Belfast, MD;  [facility];  Service: ENT;  Laterality: N/A;  nasal septoplasty, bil. inferior turbinated reduction, bil. endoscopic concha bullosa and anterior ethmoid and maxillary antrostomy, ethmoidectomy  . NASAL SINUS SURGERY  08/30/2011   Procedure: ENDOSCOPIC SINUS SURGERY;  Surgeon: Jerrell Belfast, MD;  Location: [place];  Service: ENT;  Laterality: N/A;  . TUBAL LIGATION      No Known Allergies  Family History  Problem Relation Age of Onset    . Leukemia Mother   . Lung cancer Father      Social History   Socioeconomic History  . Marital status: Divorced    Spouse name: Not on file  . Number of children: Not on file  . Years of education: Not on file  . Highest education level: Not on file  Occupational History    Employer: [employer]  . Financial resource strain: Not on file  . Food insecurity:    Worry: Not on file    Inability: Not on file  . Transportation needs:    Medical: Not on file    Non-medical: Not on file  Tobacco Use  . Smoking status: Never Smoker  . Smokeless tobacco: Never Used  Substance and Sexual Activity  . Alcohol use: No  . Drug use: No  . Sexual activity: Not on file  Lifestyle  . Physical activity:    Days per week: Not on file    Minutes per session: Not on file  . Stress: Not on file  Relationships  . Social connections:    Talks on phone: Not on file    Gets together: Not on file    Attends religious service: Not on file    Active member of club or organization: Not on file    Attends meetings of clubs or organizations: Not on file  Relationship status: Not on file  . Intimate partner violence:    Fear of current or ex partner: Not on file    Emotionally abused: Not on file    Physically abused: Not on file    Forced sexual activity: Not on file  Other Topics Concern  . Not on file  Social History Narrative  . Not on file     PHYSICAL EXAM:  VS: BP 124/78 (BP Location: Left Arm, Patient Position: Sitting, Cuff Size: Normal)   Pulse 74   Temp 97.8 F (36.6 C) (Oral)   Ht 5\' 7"  (1.702 m)   Wt 172 lb (78 kg)   SpO2 97%   BMI 26.94 kg/m  Physical Exam Gen: NAD, alert, cooperative with exam, well-appearing      ASSESSMENT & PLAN:   Left navicular fracture of foot Spends most of the day at work on her feet.   Patient was fitted for a standard, cushioned, semi-rigid orthotic. The orthotic was heated and afterward the patient stood on  the orthotic blank positioned on the orthotic stand. The patient was positioned in subtalar neutral position and 10 degrees of ankle dorsiflexion in a weight bearing stance. After completion of molding, a stable base was applied to the orthotic blank. The blank was ground to a stable position for weight bearing. Size: 9 Base: Blue EVA Additional Posting and Padding: None The patient ambulated these, and they were very comfortable.

## 2017-11-10 NOTE — Assessment & Plan Note (Signed)
Spends most of the day at work on her feet.   Patient was fitted for a standard, cushioned, semi-rigid orthotic. The orthotic was heated and afterward the patient stood on the orthotic blank positioned on the orthotic stand. The patient was positioned in subtalar neutral position and 10 degrees of ankle dorsiflexion in a weight bearing stance. After completion of molding, a stable base was applied to the orthotic blank. The blank was ground to a stable position for weight bearing. Size: 9 Base: Blue EVA Additional Posting and Padding: None The patient ambulated these, and they were very comfortable.

## 2017-11-27 NOTE — Progress Notes (Deleted)
[name] [address] [place], [place] [address] Phone: [phone] Subjective:    I'm seeing this patient by the request  of:    CC:   [id number]  Sherry [name] is a 62 y.o. female coming in with complaint of ***  Onset-  Location Duration-  Character- Aggravating factors- Reliving factors-  Therapies tried-  Severity-     Past Medical History:  Diagnosis Date  . Deviated septum   . Hepatitis    hx hep b 1984  . Hyperlipemia   . Hypothyroidism   . PONV (postoperative nausea and vomiting)   . Sinusitis, chronic   . Tubular adenoma 10/09/2017   No high-grade dysplasia or malignancy    Past Surgical History:  Procedure Laterality Date  . ANTERIOR CRUCIATE LIGAMENT REPAIR Right   . HAND SURGERY Left   . HERPES SIMPLEX VIRUS DFA    . KNEE SURGERY Left   . NASAL SEPTOPLASTY W/ TURBINOPLASTY  08/30/2011   Procedure: NASAL SEPTOPLASTY WITH TURBINATE REDUCTION;  Surgeon: Jerrell Belfast, MD;  Location: [facility];  Service: ENT;  Laterality: N/A;  nasal septoplasty, bil. inferior turbinated reduction, bil. endoscopic concha bullosa and anterior ethmoid and maxillary antrostomy, ethmoidectomy  . NASAL SINUS SURGERY  08/30/2011   Procedure: ENDOSCOPIC SINUS SURGERY;  Surgeon: Jerrell Belfast, MD;  Location: [facility];  Service: ENT;  Laterality: N/A;  . TUBAL LIGATION     Social History   Socioeconomic History  . Marital status: Divorced    Spouse name: Not on file  . Number of children: Not on file  . Years of education: Not on file  . Highest education level: Not on file  Occupational History    Employer: [employer]  . Financial resource strain: Not on file  . Food insecurity:    Worry: Not on file    Inability: Not on file  . Transportation needs:    Medical: Not on file    Non-medical: Not on file  Tobacco Use  . Smoking status: Never Smoker  . Smokeless tobacco: Never  Used  Substance and Sexual Activity  . Alcohol use: No  . Drug use: No  . Sexual activity: Not on file  Lifestyle  . Physical activity:    Days per week: Not on file    Minutes per session: Not on file  . Stress: Not on file  Relationships  . Social connections:    Talks on phone: Not on file    Gets together: Not on file    Attends religious service: Not on file    Active member of club or organization: Not on file    Attends meetings of clubs or organizations: Not on file    Relationship status: Not on file  Other Topics Concern  . Not on file  Social History Narrative  . Not on file   No Known Allergies Family History  Problem Relation Age of Onset  . Leukemia Mother   . Lung cancer Father      Past medical history, social, surgical and family history all reviewed in electronic medical record.  No pertanent information unless stated regarding to the chief complaint.   Review of Systems:Review of systems updated and as accurate as of 11/27/17  No headache, visual changes, nausea, vomiting, diarrhea, constipation, dizziness, abdominal pain, skin rash, fevers, chills, night sweats, weight loss, swollen lymph nodes, body aches, joint swelling, muscle aches, chest pain, shortness  of breath, mood changes.   Objective  There were no vitals taken for this visit. Systems examined below as of 11/27/17   General: No apparent distress alert and oriented x3 mood and affect normal, dressed appropriately.  HEENT: Pupils equal, extraocular movements intact  Respiratory: Patient's speak in full sentences and does not appear short of breath  Cardiovascular: No lower extremity edema, non tender, no erythema  Skin: Warm dry intact with no signs of infection or rash on extremities or on axial skeleton.  Abdomen: Soft nontender  Neuro: Cranial nerves II through XII are intact, neurovascularly intact in all extremities with 2+ DTRs and 2+ pulses.  Lymph: No lymphadenopathy of posterior or  anterior cervical chain or axillae bilaterally.  Gait normal with good balance and coordination.  MSK:  Non tender with full range of motion and good stability and symmetric strength and tone of shoulders, elbows, wrist, hip, knee and ankles bilaterally.     Impression and Recommendations:     This case required medical decision making of moderate complexity.      Note: This dictation was prepared with Dragon dictation along with smaller phrase technology. Any transcriptional errors that result from this process are unintentional.

## 2017-12-01 ENCOUNTER — Ambulatory Visit: Payer: 59 | Admitting: Family Medicine

## 2017-12-18 IMAGING — MR MR LUMBAR SPINE W/O CM
4 of 5 series · 23 of 48 positions shown · non-contrast
Comparison: Radiography 05/13/2016

CLINICAL DATA: Right-sided low back pain, hip and leg pain with
weakness over the last 2 years.

EXAM:
MRI LUMBAR SPINE WITHOUT CONTRAST
TECHNIQUE: Multiplanar, multisequence MR imaging of the lumbar spine was
performed. No intravenous contrast was administered.

[Series 3: T2 · sagittal · 4.0mm · 0.44mm/px · 5 of 12 slices shown (1 of 2)]
[im 1/12]
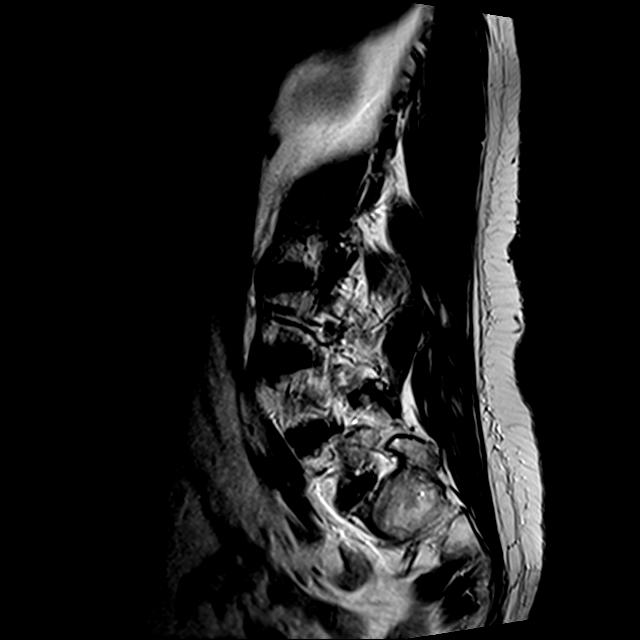
[im 3/12]
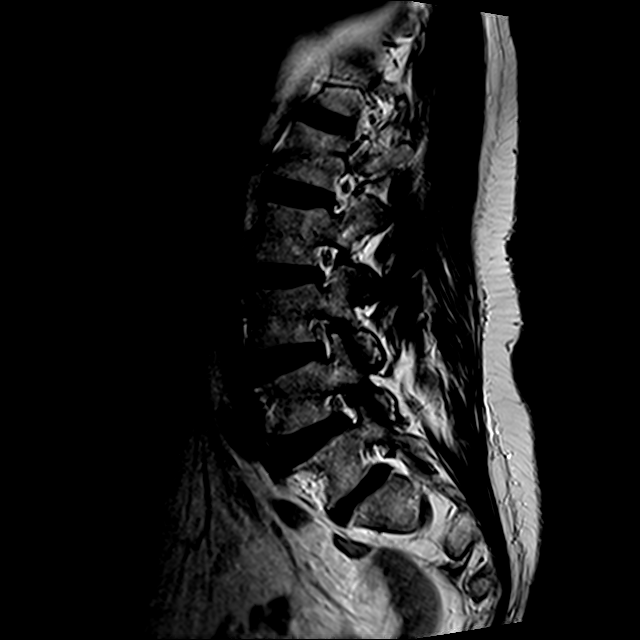
[im 6/12]
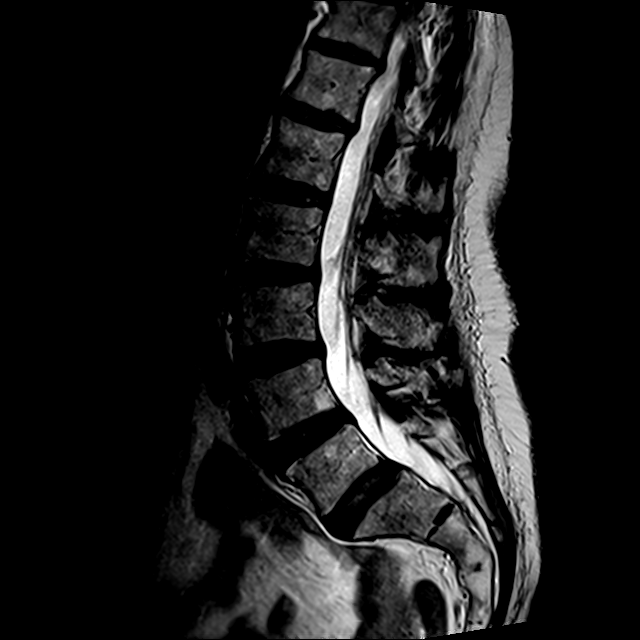
[im 9/12]
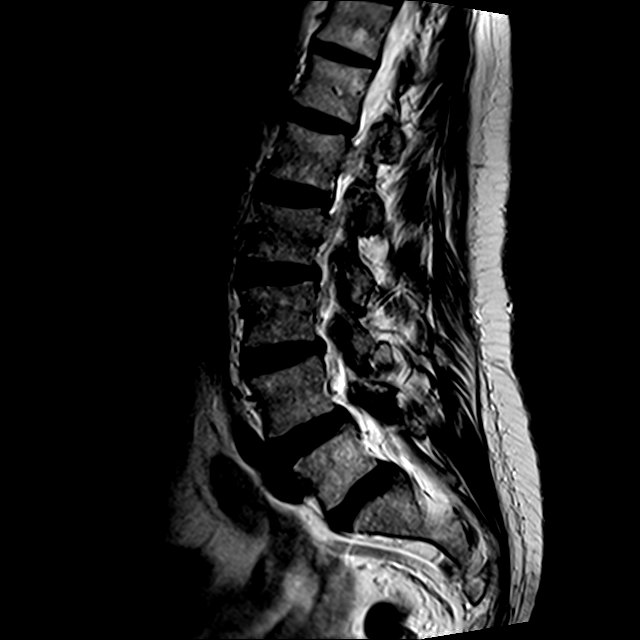
[im 12/12]
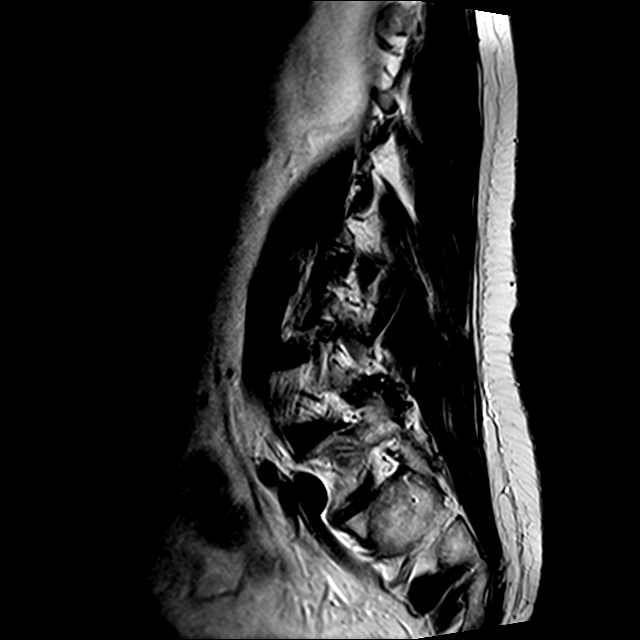

[Series 4: T1 · sagittal · 4.0mm · 0.55mm/px · 5 of 12 slices shown (1 of 2)]
[im 1/12]
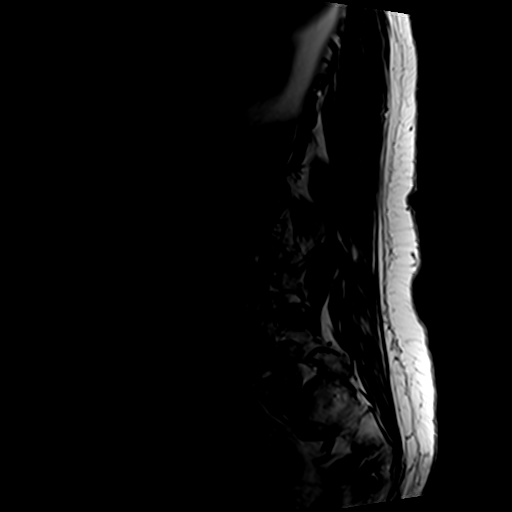
[im 3/12]
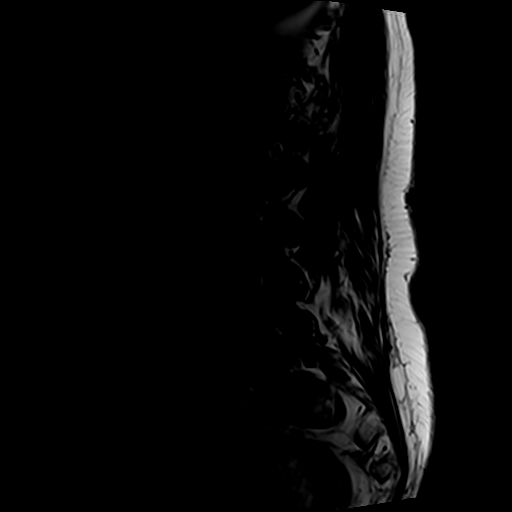
[im 6/12]
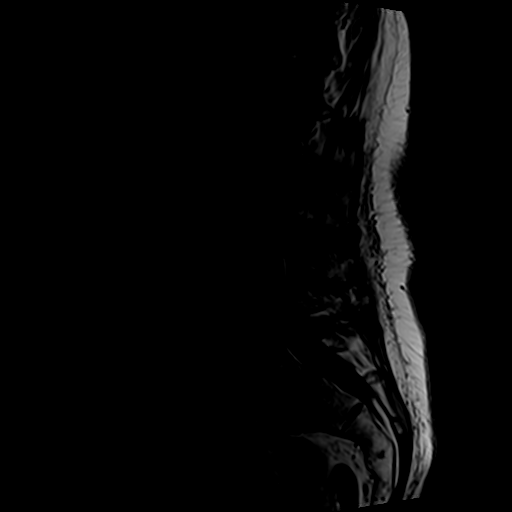
[im 9/12]
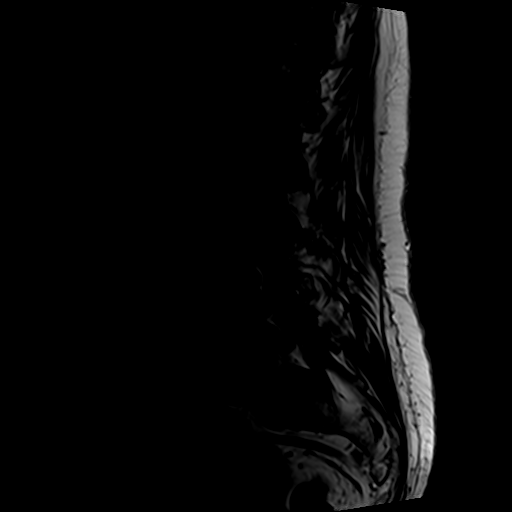
[im 12/12]
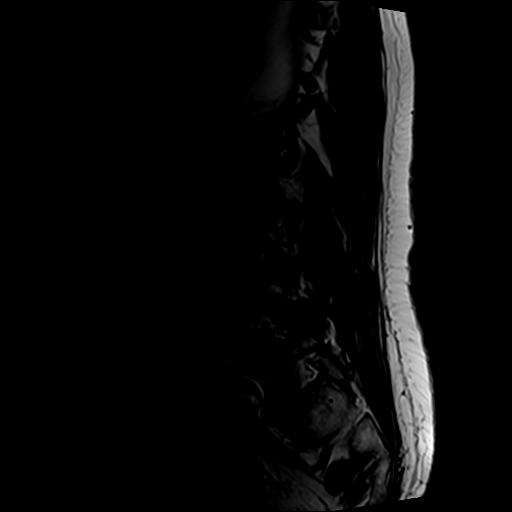

[Series 6: T1 · axial · 4.0mm · 0.37mm/px · z∈[-60,+97]mm · 3 of 34 slices shown (2 of 2)]
[im 5/34]
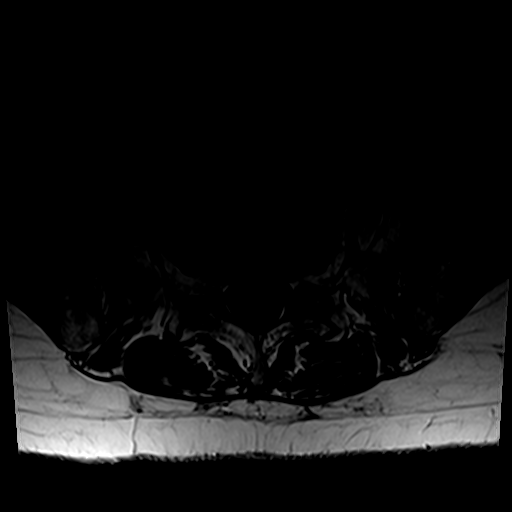
[im 18/34]
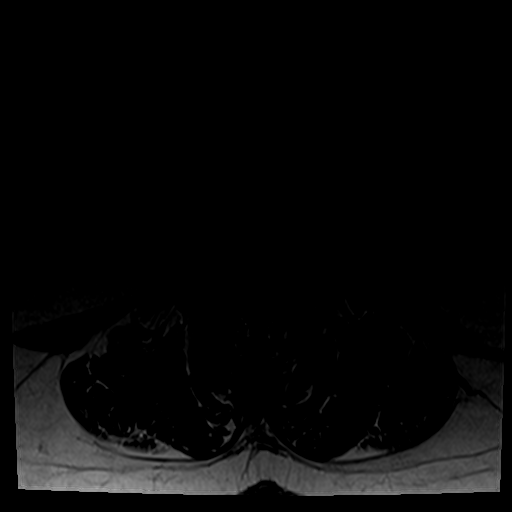
[im 29/34]
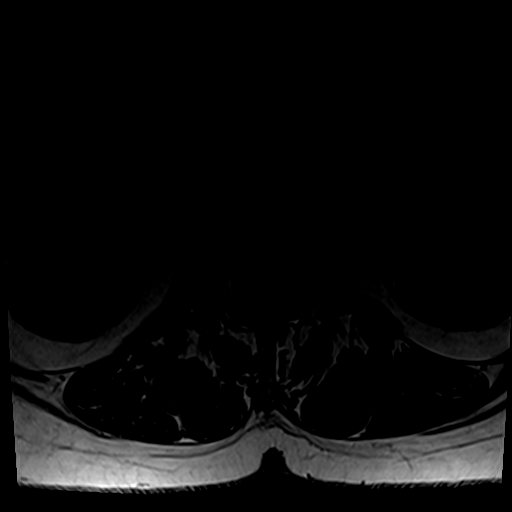

[Series 7: T2 · axial · 4.0mm · 0.74mm/px · z∈[-70,+181]mm · 10 of 34 slices shown (2 of 2)]
[im 3/34]
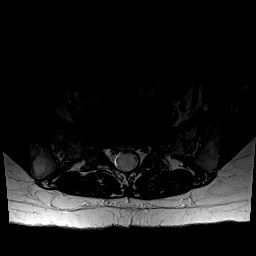
[im 5/34]
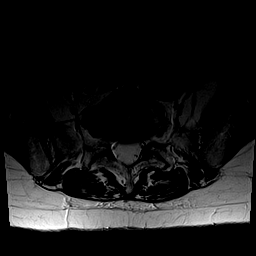
[im 7/34]
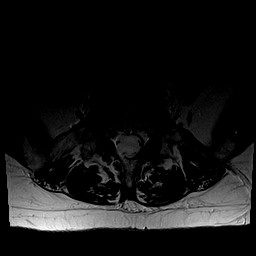
[im 12/34]
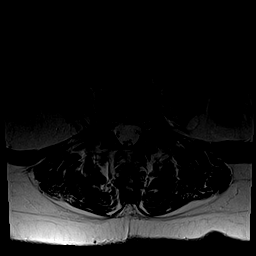
[im 16/34]
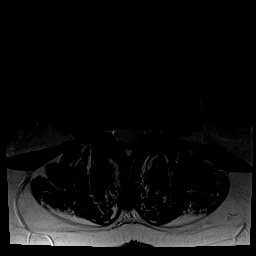
[im 18/34]
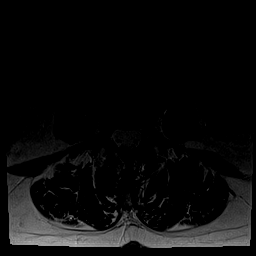
[im 20/34]
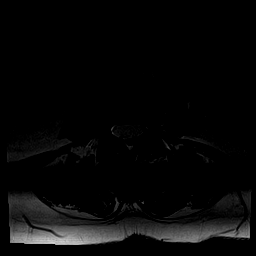
[im 25/34]
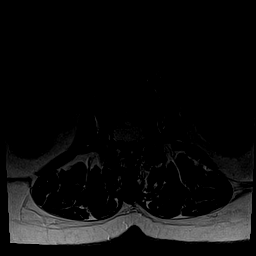
[im 29/34]
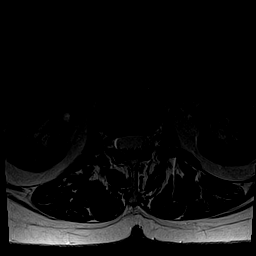
[im 34/34]
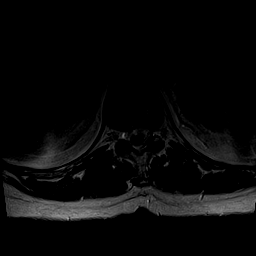

[23 of 48 positions shown; findings below may reference images not displayed]

FINDINGS: Segmentation:  5 lumbar type vertebral bodies.

Alignment: Somewhat exaggerated lumbar lordosis. 2 mm
anterolisthesis L4-5 and L5-S1.

Vertebrae:  No fracture or primary bone lesion.

Conus medullaris and cauda equina: Conus extends to the L1 level.
Conus and cauda equina appear normal.

Paraspinal and other soft tissues: Negative

Disc levels:

T11-12: Small chronic appearing right paracentral disc herniation
without neural compression.

T12-L1: Minimal disc bulge.  No stenosis.

L1-2: Normal interspace.

L2-3:  Minimal disc bulge.  No stenosis or neural compression.

L3-4: Mild disc bulge. No stenosis or neural compression. Minimal
facet hypertrophy.

L4-5: Bilateral facet degeneration with 2 mm anterolisthesis. Mild
desiccation and bulging of the disc. No compressive stenosis.

L5-S1: Bilateral facet degeneration with 2 mm anterolisthesis.
Minimal desiccation and bulging of the disc. No stenosis.
IMPRESSION: Somewhat exaggerated lumbar lordosis. Facet degeneration at L4-5 and
L5-S1 with 2 mm anterolisthesis at those levels. Minimal disc
bulges. No stenosis or neural compression. Particularly at L4-5,
there is mild edema associated with the facets in these could be a
cause of low back pain or referred facet syndrome pain.

## 2017-12-22 NOTE — Progress Notes (Signed)
[name] [place], [place] [address] Phone: [phone] Subjective:    I'm seeing this patient by the request  of:    CC: Foot pain follow-up  [id number]  Sherry [name] is a 62 y.o. female coming in with complaint of foot pain follow-up.  Patient states that the bone does not seem to hurt as much.  States that it is only had a long day when she is standing for a long time does have some increasing discomfort and pain.  Patient states that she does not have any swelling.  Has not needed her cam walker for some time.  Would state that she is 95% herself      Past Medical History:  Diagnosis Date  . Deviated septum   . Hepatitis    hx hep b 1984  . Hyperlipemia   . Hypothyroidism   . PONV (postoperative nausea and vomiting)   . Sinusitis, chronic   . Tubular adenoma 10/09/2017   No high-grade dysplasia or malignancy    Past Surgical History:  Procedure Laterality Date  . ANTERIOR CRUCIATE LIGAMENT REPAIR Right   . HAND SURGERY Left   . HERPES SIMPLEX VIRUS DFA    . KNEE SURGERY Left   . NASAL SEPTOPLASTY W/ TURBINOPLASTY  08/30/2011   Procedure: NASAL SEPTOPLASTY WITH TURBINATE REDUCTION;  Surgeon: Jerrell Belfast, MD;  Location: [facility];  Service: ENT;  Laterality: N/A;  nasal septoplasty, bil. inferior turbinated reduction, bil. endoscopic concha bullosa and anterior ethmoid and maxillary antrostomy, ethmoidectomy  . NASAL SINUS SURGERY  08/30/2011   Procedure: ENDOSCOPIC SINUS SURGERY;  Surgeon: Jerrell Belfast, MD;  Location: [facility];  Service: ENT;  Laterality: N/A;  . TUBAL LIGATION     Social History   Socioeconomic History  . Marital status: Divorced    Spouse name: Not on file  . Number of children: Not on file  . Years of education: Not on file  . Highest education level: Not on file  Occupational History    Employer: [employer]  . Financial resource  strain: Not on file  . Food insecurity:    Worry: Not on file    Inability: Not on file  . Transportation needs:    Medical: Not on file    Non-medical: Not on file  Tobacco Use  . Smoking status: Never Smoker  . Smokeless tobacco: Never Used  Substance and Sexual Activity  . Alcohol use: No  . Drug use: No  . Sexual activity: Not on file  Lifestyle  . Physical activity:    Days per week: Not on file    Minutes per session: Not on file  . Stress: Not on file  Relationships  . Social connections:    Talks on phone: Not on file    Gets together: Not on file    Attends religious service: Not on file    Active member of club or organization: Not on file    Attends meetings of clubs or organizations: Not on file    Relationship status: Not on file  Other Topics Concern  . Not on file  Social History Narrative  . Not on file   No Known Allergies Family History  Problem Relation Age of Onset  . Leukemia Mother   . Lung cancer Father      Past medical history, social, surgical and family history all reviewed in electronic medical record.  No  pertanent information unless stated regarding to the chief complaint.   Review of Systems:Review of systems updated and as accurate as of 12/23/17  No headache, visual changes, nausea, vomiting, diarrhea, constipation, dizziness, abdominal pain, skin rash, fevers, chills, night sweats, weight loss, swollen lymph nodes, body aches, joint swelling, muscle aches, chest pain, shortness of breath, mood changes.   Objective  Blood pressure (!) 148/80, pulse 73, height 5\' 7"  (1.702 m), SpO2 98 %. Systems examined below as of 12/23/17   General: No apparent distress alert and oriented x3 mood and affect normal, dressed appropriately.  HEENT: Pupils equal, extraocular movements intact  Respiratory: Patient's speak in full sentences and does not appear short of breath  Cardiovascular: No lower extremity edema, non tender, no erythema  Skin:  Warm dry intact with no signs of infection or rash on extremities or on axial skeleton.  Abdomen: Soft nontender  Neuro: Cranial nerves II through XII are intact, neurovascularly intact in all extremities with 2+ DTRs and 2+ pulses.  Lymph: No lymphadenopathy of posterior or anterior cervical chain or axillae bilaterally.  Gait normal with good balance and coordination.  MSK:  Non tender with full range of motion and good stability and symmetric strength and tone of shoulders, elbows, wrist, hip, knee bilaterally.  Foot exam shows the patient still has the breakdown of the longitudinal arch him with patient also having the rupture of the deltoid ligament does have significant pes planus noted with overpronation of the hindfoot.  Still tender to palpation of the navicular bone.  Neurovascularly intact distally.   Impression and Recommendations:     This case required medical decision making of moderate complexity.      Note: This dictation was prepared with Dragon dictation along with smaller phrase technology. Any transcriptional errors that result from this process are unintentional.

## 2017-12-23 ENCOUNTER — Ambulatory Visit: Payer: 59 | Admitting: Family Medicine

## 2017-12-23 ENCOUNTER — Encounter: Payer: Self-pay | Admitting: Family Medicine

## 2017-12-23 VITALS — BP 148/80 | HR 73 | Ht 67.0 in

## 2017-12-23 DIAGNOSIS — M76822 Posterior tibial tendinitis, left leg: Secondary | ICD-10-CM

## 2017-12-23 DIAGNOSIS — S92255S Nondisplaced fracture of navicular [scaphoid] of left foot, sequela: Secondary | ICD-10-CM | POA: Diagnosis not present

## 2017-12-23 MED ORDER — VITAMIN D (ERGOCALCIFEROL) 1.25 MG (50000 UNIT) PO CAPS: 50000.0000 [IU] | ORAL_CAPSULE | ORAL | 0 refills | Status: DC

## 2017-12-23 NOTE — Patient Instructions (Addendum)
Tarsal tunnel syndrome  Ice is your friend Exercises 3 times a week.  COnitnue the good shoes.  Refilled the vitamin D for now.  See me again in 4 weeks if not better we can consider injection

## 2017-12-23 NOTE — Assessment & Plan Note (Signed)
Patient is well-healed at this time.  Still has some difficulty with her vitamin D.  We discussed with patient about the possibility of a tarsal tunnel injection which patient declined.  Will be some to consider with patient having some of the radicular symptoms from time to time.  Mild tibial and posterior tibialis tendinitis.  Discussed icing regimen, proper orthotics, follow-up again in 4 weeks

## 2018-01-05 ENCOUNTER — Encounter: Payer: Self-pay | Admitting: Family Medicine

## 2018-01-07 ENCOUNTER — Other Ambulatory Visit: Payer: Self-pay

## 2018-01-07 DIAGNOSIS — M545 Low back pain, unspecified: Secondary | ICD-10-CM

## 2018-01-20 ENCOUNTER — Encounter: Payer: Self-pay | Admitting: Family Medicine

## 2018-01-20 ENCOUNTER — Ambulatory Visit: Payer: 59 | Admitting: Family Medicine

## 2018-01-20 ENCOUNTER — Telehealth: Payer: Self-pay | Admitting: Surgical

## 2018-01-20 ENCOUNTER — Other Ambulatory Visit (HOSPITAL_COMMUNITY)
Admission: RE | Admit: 2018-01-20 | Discharge: 2018-01-20 | Disposition: A | Discharge status: Home / Self Care | Payer: 59 | Source: Ambulatory Visit | Attending: Family Medicine | Admitting: Family Medicine

## 2018-01-20 VITALS — BP 136/64 | HR 87 | Temp 98.2°F | Ht 67.0 in | Wt 185.0 lb

## 2018-01-20 DIAGNOSIS — Z1159 Encounter for screening for other viral diseases: Secondary | ICD-10-CM | POA: Diagnosis not present

## 2018-01-20 DIAGNOSIS — Z9189 Other specified personal risk factors, not elsewhere classified: Secondary | ICD-10-CM

## 2018-01-20 DIAGNOSIS — Z124 Encounter for screening for malignant neoplasm of cervix: Secondary | ICD-10-CM

## 2018-01-20 DIAGNOSIS — G44219 Episodic tension-type headache, not intractable: Secondary | ICD-10-CM | POA: Diagnosis not present

## 2018-01-20 DIAGNOSIS — N819 Female genital prolapse, unspecified: Secondary | ICD-10-CM

## 2018-01-20 MED ORDER — BACLOFEN 10 MG PO TABS
10.0000 mg | ORAL_TABLET | Freq: Three times a day (TID) | ORAL | 0 refills | Status: DC
Start: 2018-01-20 — End: 2018-07-13

## 2018-01-20 MED ORDER — BUTALBITAL-APAP-CAFFEINE 50-325-40 MG PO TABS
1.0000 | ORAL_TABLET | Freq: Four times a day (QID) | ORAL | 0 refills | Status: AC | PRN
Start: 2018-01-20 — End: 2019-01-20

## 2018-01-20 NOTE — Telephone Encounter (Signed)
Please advise on medication

## 2018-01-20 NOTE — Telephone Encounter (Signed)
Sent just now.

## 2018-01-20 NOTE — Telephone Encounter (Signed)
Left message on patients phone that prescription has been sent to the pharmacy.

## 2018-01-20 NOTE — Progress Notes (Signed)
Sherry [name] [id number] is a 62 y.o. female is here for follow up.  History of Present Illness:   Sherry [name] acting as scribe for Dr. Briscoe [name].   DJS:[id number] in office for follow up. She is still having sinus headache from last visit. It has improved some she does not have congestion any longer.   She would like to get pap done today as well.   Health Maintenance Due  Topic Date Due  . Hepatitis C Screening  Nov 17, 1955  . HIV Screening  06/03/1969  . MAMMOGRAM  12/24/2015   Depression screen PHQ 2/9 09/19/2017  Decreased Interest 0  Down, Depressed, Hopeless 0  PHQ - 2 Score 0  Altered sleeping 0  Tired, decreased energy 3  Change in appetite 0  Feeling bad or failure about yourself  0  Trouble concentrating 0  Moving slowly or fidgety/restless 0  Suicidal thoughts 0  PHQ-9 Score 3  Difficult doing work/chores Not difficult at all   PMHx, SurgHx, SocialHx, FamHx, Medications, and Allergies were reviewed in the Visit Navigator and updated as appropriate.   Patient Active Problem List   Diagnosis Date Noted  . Fecal incontinence 01/21/2018  . Tubular adenoma 10/09/2017  . Generalized hypermobility of joints 09/22/2017  . PTSD (post-traumatic stress disorder) 09/22/2017  . Chronic hepatitis 09/19/2017  . Stress fracture, right foot, subsequent encounter for fracture with delayed healing 09/19/2017  . Acid reflux 09/19/2017  . Dysphagia 09/19/2017  . Arthritis 09/19/2017  . IBS (irritable bowel syndrome) 09/19/2017  . Depression 09/19/2017  . Vitamin D deficiency 09/19/2017  . Plantar fasciitis 09/19/2017  . Tear of deltoid ligament of ankle, left, initial encounter 07/08/2017  . Tension type headache, prn Fioricet 06/18/2017  . Lumbar radiculopathy, right 08/19/2016  . Trigger point of right shoulder region 04/22/2016  . Slipped rib syndrome 04/22/2016  . Nonallopathic lesion of rib cage 04/22/2016  . Nonallopathic lesion of cervical region 04/22/2016    . Nonallopathic lesion of lumbosacral region 04/22/2016  . Nonallopathic lesion of thoracic region 04/22/2016  . Piriformis syndrome of right side 04/22/2016  . Osteopenia 11/01/2015  . Left navicular fracture of foot 10/11/2015  . High risk medication use 09/20/2014  . Tibialis posterior tendinitis 12/14/2013  . De Quervain's tenosynovitis, right 12/14/2013  . Sinusitis, chronic 08/30/2011  . Deviated nasal septum 08/30/2011  . Allergic rhinitis 06/08/2007  . Insomnia, psychophsiologic 06/08/2007   Social History   Tobacco Use  . Smoking status: Never Smoker  . Smokeless tobacco: Never Used  Substance Use Topics  . Alcohol use: No  . Drug use: No   Current Medications and Allergies:   Current Outpatient Medications:  .  AMBULATORY NON FORMULARY MEDICATION, Medication Name: orthopaedic shoes.  For chronic back pain, Disp: 2 Device, Rfl: 0 .  AMBULATORY NON FORMULARY MEDICATION, Even up shoe balancer., Disp: 1 each, Rfl: 0 .  azelastine (ASTELIN) 0.1 % nasal spray, Place 1 spray into both nostrils 2 (two) times daily. Use in each nostril as directed, Disp: 30 mL, Rfl: 12 .  Cholecalciferol (VITAMIN D PO), Take by mouth. 10,000 IU daily, Disp: , Rfl:  .  fluticasone (FLONASE) 50 MCG/ACT nasal spray, Place 2 sprays into both nostrils daily., Disp: 16 g, Rfl: 6 .  Turmeric 500 MG CAPS, Take 1 capsule by mouth daily., Disp: , Rfl:  .  Vitamin D, Ergocalciferol, (DRISDOL) 50000 units CAPS capsule, Take 1 capsule (50,000 Units total) by mouth every 7 (seven) days., Disp: 12 capsule,  Rfl: 0 .  zolpidem (AMBIEN) 10 MG tablet, Take 10 mg by mouth as needed., Disp: , Rfl:   No Known Allergies   Review of Systems   Pertinent items are noted in the HPI. Otherwise, ROS is negative.  Vitals:   Vitals:   01/20/18 1109  BP: 136/64  Pulse: 87  Temp: 98.2 F (36.8 C)  TempSrc: Oral  SpO2: 98%  Weight: 185 lb (83.9 kg)  Height: 5\' 7"  (1.702 m)     Body mass index is 28.98  kg/m.  Physical Exam:   Physical Exam  Constitutional: She appears well-nourished.  HENT:  Head: Normocephalic and atraumatic.  Eyes: Pupils are equal, round, and reactive to light. EOM are normal.  Neck: Normal range of motion. Neck supple.  Cardiovascular: Normal rate, regular rhythm, normal heart sounds and intact distal pulses.  Pulmonary/Chest: Effort normal.  Abdominal: Soft.  Skin: Skin is warm.  Psychiatric: She has a normal mood and affect. Her behavior is normal.  Nursing note and vitals reviewed.   Pelvic:  External genitalia: no lesions. Urethra: normal appearing urethra with no masses, tenderness or lesions. Bartholins and Skenes: normal. Vagina: normal appearing vagina with normal color and discharge, no lesions. Cervix: normal appearance. Pap and high risk HPV testing done: Yes.  Bimanual Exam:   Uterus: uterus is normal size, shape, consistency and nontender. Adnexa: normal adnexa in size, nontender and no masses.                                       Results for orders placed or performed in visit on 01/20/18  Cytology - PAP  Result Value Ref Range   Adequacy      Satisfactory for evaluation  endocervical/transformation zone component PRESENT.   Diagnosis      NEGATIVE FOR INTRAEPITHELIAL LESIONS OR MALIGNANCY.   HPV NOT DETECTED    Material Submitted CervicoVaginal Pap [ThinPrep Imaged]    CYTOLOGY - PAP PAP RESULT     Assessment and Plan:   Sherry [name] was seen today for follow-up.  Diagnoses and all orders for this visit:  Episodic tension-type headache, not intractable -     baclofen (LIORESAL) 10 MG tablet; Take 1 tablet (10 mg total) by mouth 3 (three) times daily. -     butalbital-acetaminophen-caffeine (FIORICET, ESGIC) 50-325-40 MG tablet; Take 1-2 tablets by mouth every 6 (six) hours as needed for headache.  Encounter for hepatitis C virus screening test for high risk patient -     Hepatitis C antibody; Future  Female genital prolapse, unspecified  type -     Ambulatory referral to Physical Therapy  Screening for cervical cancer -     Cytology - PAP    . Reviewed expectations re: course of current medical issues. . Discussed self-management of symptoms. . Outlined signs and symptoms indicating need for more acute intervention. . Patient verbalized understanding and all questions were answered. Marland Kitchen Health Maintenance issues including appropriate healthy diet, exercise, and smoking avoidance were discussed with patient. . See orders for this visit as documented in the electronic medical record. . Patient received an After Visit Summary.  Sherry Deutscher, DO [place], [place] [facility] 01/24/2018  Future Appointments  Date Time Provider [place]  01/30/2018 10:30 AM [facility]   [name] served as scribe during this visit. History, Physical, and Plan performed by medical provider. The above documentation has  been reviewed and is accurate and complete. Sherry [name], D.O.

## 2018-01-20 NOTE — Telephone Encounter (Signed)
Copied from [place] [id number]. Topic: General - Other >> Jan 20, 2018 12:44 PM Mcneil, Ja-Kwan wrote: Reason for CRM: Pt states she had an appt today 01/20/18 and she was told that a Rx for a headache medication would be sent to the pharmacy but she is at the pharmacy and no Rx was sent. Pt requests a call back. Cb# [phone]

## 2018-01-21 DIAGNOSIS — R159 Full incontinence of feces: Secondary | ICD-10-CM | POA: Insufficient documentation

## 2018-01-21 LAB — CYTOLOGY - PAP
Diagnosis: NEGATIVE
HPV: NOT DETECTED

## 2018-01-24 ENCOUNTER — Encounter: Payer: Self-pay | Admitting: Family Medicine

## 2018-01-30 ENCOUNTER — Ambulatory Visit
Admission: RE | Admit: 2018-01-30 | Discharge: 2018-01-30 | Disposition: A | Discharge status: Home / Self Care | Payer: 59 | Source: Ambulatory Visit | Attending: Family Medicine | Admitting: Family Medicine

## 2018-01-30 DIAGNOSIS — Z1231 Encounter for screening mammogram for malignant neoplasm of breast: Secondary | ICD-10-CM | POA: Diagnosis not present

## 2018-01-30 DIAGNOSIS — Z1239 Encounter for other screening for malignant neoplasm of breast: Secondary | ICD-10-CM

## 2018-02-05 ENCOUNTER — Other Ambulatory Visit: Payer: Self-pay | Admitting: Family Medicine

## 2018-02-05 ENCOUNTER — Ambulatory Visit
Admission: RE | Admit: 2018-02-05 | Discharge: 2018-02-05 | Disposition: A | Discharge status: Home / Self Care | Payer: 59 | Source: Ambulatory Visit | Attending: Family Medicine | Admitting: Family Medicine

## 2018-02-05 DIAGNOSIS — M545 Low back pain, unspecified: Secondary | ICD-10-CM

## 2018-02-05 MED ORDER — METHYLPREDNISOLONE ACETATE 40 MG/ML INJ SUSP (RADIOLOG
120.0000 mg | Freq: Once | INTRAMUSCULAR | Status: AC
  Administered 2018-02-05: 120 mg via INTRA_ARTICULAR

## 2018-02-05 MED ORDER — IOPAMIDOL (ISOVUE-M 200) INJECTION 41%
1.0000 mL | Freq: Once | INTRAMUSCULAR | Status: AC
  Administered 2018-02-05: 1 mL via INTRA_ARTICULAR

## 2018-02-05 NOTE — Discharge Instructions (Signed)

## 2018-02-16 NOTE — Progress Notes (Signed)
[name] [address], [place] [address] Phone: [phone] Subjective:    I'm seeing this patient by the request  of:    CC: Left foot pain  [id number]  Sherry [name] is a 62 y.o. female coming in with complaint of left foot. Foot is doing well. Tension bilateral in both shoulders that she would like to talk about.  Patient states that more tightness than anything else.  Responded fairly well to osteopathic manipulation for some time in the lower back.  Feels like this could be beneficial for the neck.  Denies any radiation down the arm or any numbness or tingling.  Patient wants to be more active but finds it difficult.     Past Medical History:  Diagnosis Date  . Deviated septum   . Hepatitis    hx hep b 1984  . Hyperlipemia   . Hypothyroidism   . PONV (postoperative nausea and vomiting)   . Sinusitis, chronic   . Tubular adenoma 10/09/2017   No high-grade dysplasia or malignancy    Past Surgical History:  Procedure Laterality Date  . ANTERIOR CRUCIATE LIGAMENT REPAIR Right   . HAND SURGERY Left   . HERPES SIMPLEX VIRUS DFA    . KNEE SURGERY Left   . NASAL SEPTOPLASTY W/ TURBINOPLASTY  08/30/2011   Procedure: NASAL SEPTOPLASTY WITH TURBINATE REDUCTION;  Surgeon: Jerrell Belfast, MD;  Location: [facility];  Service: ENT;  Laterality: N/A;  nasal septoplasty, bil. inferior turbinated reduction, bil. endoscopic concha bullosa and anterior ethmoid and maxillary antrostomy, ethmoidectomy  . NASAL SINUS SURGERY  08/30/2011   Procedure: ENDOSCOPIC SINUS SURGERY;  Surgeon: Jerrell Belfast, MD;  Location: [facility];  Service: ENT;  Laterality: N/A;  . TUBAL LIGATION     Social History   Socioeconomic History  . Marital status: Divorced    Spouse name: Not on file  . Number of children: Not on file  . Years of education: Not on file  . Highest education level: Not on file  Occupational History   Employer: [employer]  . Financial resource strain: Not on file  . Food insecurity:    Worry: Not on file    Inability: Not on file  . Transportation needs:    Medical: Not on file    Non-medical: Not on file  Tobacco Use  . Smoking status: Never Smoker  . Smokeless tobacco: Never Used  Substance and Sexual Activity  . Alcohol use: No  . Drug use: No  . Sexual activity: Not on file  Lifestyle  . Physical activity:    Days per week: Not on file    Minutes per session: Not on file  . Stress: Not on file  Relationships  . Social connections:    Talks on phone: Not on file    Gets together: Not on file    Attends religious service: Not on file    Active member of club or organization: Not on file    Attends meetings of clubs or organizations: Not on file    Relationship status: Not on file  Other Topics Concern  . Not on file  Social History Narrative  . Not on file   No Known Allergies Family History  Problem Relation Age of Onset  . Leukemia Mother   . Lung cancer Father      Past medical history, social, surgical and family history all reviewed in electronic medical record.  No pertanent information unless stated regarding to the chief complaint.   Review of Systems:Review of systems updated and as accurate as of 02/17/18  No headache, visual changes, nausea, vomiting, diarrhea, constipation, dizziness, abdominal pain, skin rash, fevers, chills, night sweats, weight loss, swollen lymph nodes, body aches, joint swelling,  chest pain, shortness of breath, mood changes.  Positive a muscle aches  Objective  Blood pressure 116/80, pulse 68, height 5\' 7"  (1.702 m), SpO2 98 %. Systems examined below as of 02/17/18   General: No apparent distress alert and oriented x3 mood and affect normal, dressed appropriately.  HEENT: Pupils equal, extraocular movements intact  Respiratory: Patient's speak in full sentences and does not appear short of breath    Cardiovascular: No lower extremity edema, non tender, no erythema  Skin: Warm dry intact with no signs of infection or rash on extremities or on axial skeleton.  Abdomen: Soft nontender  Neuro: Cranial nerves II through XII are intact, neurovascularly intact in all extremities with 2+ DTRs and 2+ pulses.  Lymph: No lymphadenopathy of posterior or anterior cervical chain or axillae bilaterally.  Gait normal with good balance and coordination.  MSK:  Non tender with full range of motion and good stability and symmetric strength and tone of shoulders, elbows, wrist, hip, knee and ankles bilaterally.  Mild to moderate arthritic changes of multiple joints  Neck: Inspection of lordosis. No palpable stepoffs. Negative Spurling's maneuver. Mild limitation of 5 degrees in all planes Grip strength and sensation normal in bilateral hands Strength good C4 to T1 distribution No sensory change to C4 to T1 Negative Hoffman sign bilaterally Reflexes normal Significant tightness of the trapezius bilaterally   Osteopathic findings C2 flexed rotated and side bent right C4 flexed rotated and side bent left C6 flexed rotated and side bent left T3 extended rotated and side bent right inhaled third rib T9 extended rotated and side bent left L2 flexed rotated and side bent right Sacrum right on right    Impression and Recommendations:     This case required medical decision making of moderate complexity.      Note: This dictation was prepared with Dragon dictation along with smaller phrase technology. Any transcriptional errors that result from this process are unintentional.

## 2018-02-17 ENCOUNTER — Encounter: Payer: Self-pay | Admitting: Family Medicine

## 2018-02-17 ENCOUNTER — Ambulatory Visit: Payer: 59 | Admitting: Family Medicine

## 2018-02-17 VITALS — BP 116/80 | HR 68 | Ht 67.0 in

## 2018-02-17 DIAGNOSIS — M999 Biomechanical lesion, unspecified: Secondary | ICD-10-CM

## 2018-02-17 DIAGNOSIS — M940 Chondrocostal junction syndrome [Tietze]: Secondary | ICD-10-CM | POA: Diagnosis not present

## 2018-02-17 NOTE — Assessment & Plan Note (Signed)
I believe the patient has poor posture, scapular dyskinesis, slipped rib syndrome.  Responded well to osteopathic manipulation today.  Discussed icing regimen, home exercise, which activities of doing which wants to avoid.  Patient will increase activity as tolerated.  Follow-up with me again in 4 to 6 weeks

## 2018-02-17 NOTE — Patient Instructions (Signed)
Good to see you  Ice is your friend Exercises 3 times a week.  Keep hands within peripheral vision as much as possible.  Stay active Continue the vitamins Started manipulation again today  See me again in 4 weeks

## 2018-02-17 NOTE — Assessment & Plan Note (Signed)
Decision today to treat with OMT was based on Physical Exam  After verbal consent patient was treated with HVLA, ME, FPR techniques in cervical, thoracic, rib, lumbar and sacral areas  Patient tolerated the procedure well with improvement in symptoms  Patient given exercises, stretches and lifestyle modifications  See medications in patient instructions if given  Patient will follow up in 4-6 weeks 

## 2018-02-18 DIAGNOSIS — M545 Low back pain: Secondary | ICD-10-CM | POA: Diagnosis not present

## 2018-02-18 DIAGNOSIS — N39498 Other specified urinary incontinence: Secondary | ICD-10-CM | POA: Diagnosis not present

## 2018-02-18 DIAGNOSIS — N8110 Cystocele, unspecified: Secondary | ICD-10-CM | POA: Diagnosis not present

## 2018-02-18 DIAGNOSIS — M6281 Muscle weakness (generalized): Secondary | ICD-10-CM | POA: Diagnosis not present

## 2018-02-25 DIAGNOSIS — N39498 Other specified urinary incontinence: Secondary | ICD-10-CM | POA: Diagnosis not present

## 2018-02-25 DIAGNOSIS — M6281 Muscle weakness (generalized): Secondary | ICD-10-CM | POA: Diagnosis not present

## 2018-02-25 DIAGNOSIS — N8110 Cystocele, unspecified: Secondary | ICD-10-CM | POA: Diagnosis not present

## 2018-02-25 DIAGNOSIS — M545 Low back pain: Secondary | ICD-10-CM | POA: Diagnosis not present

## 2018-03-16 DIAGNOSIS — M545 Low back pain: Secondary | ICD-10-CM | POA: Diagnosis not present

## 2018-03-16 DIAGNOSIS — M6281 Muscle weakness (generalized): Secondary | ICD-10-CM | POA: Diagnosis not present

## 2018-03-16 DIAGNOSIS — N39498 Other specified urinary incontinence: Secondary | ICD-10-CM | POA: Diagnosis not present

## 2018-03-16 DIAGNOSIS — N8110 Cystocele, unspecified: Secondary | ICD-10-CM | POA: Diagnosis not present

## 2018-03-16 NOTE — Progress Notes (Signed)
[name] [address] [place], [place] [address] Phone: [phone] Subjective:    I Sherry [name] am serving as a Education administrator for Dr. Hulan Saas.   CC: Back pain follow-up  BJY:[id number]  Sherry [name] is a 62 y.o. female coming in with complaint of back pain. Right hip pain today. Would like to reprint foot, hip and upper back exercises. Has not been doing them regularly.  Patient has been doing some exercises but only for her pelvic floor.  Has been in therapy for this.  Feels that there is tightness.  Nothing to severe though.  Not stopping her from activity but at the end of a long shift has increasing discomfort and pain.     Past Medical History:  Diagnosis Date  . Deviated septum   . Hepatitis    hx hep b 1984  . Hyperlipemia   . Hypothyroidism   . PONV (postoperative nausea and vomiting)   . Sinusitis, chronic   . Tubular adenoma 10/09/2017   No high-grade dysplasia or malignancy    Past Surgical History:  Procedure Laterality Date  . ANTERIOR CRUCIATE LIGAMENT REPAIR Right   . HAND SURGERY Left   . HERPES SIMPLEX VIRUS DFA    . KNEE SURGERY Left   . NASAL SEPTOPLASTY W/ TURBINOPLASTY  08/30/2011   Procedure: NASAL SEPTOPLASTY WITH TURBINATE REDUCTION;  Surgeon: Jerrell Belfast, MD;  Location: [facility];  Service: ENT;  Laterality: N/A;  nasal septoplasty, bil. inferior turbinated reduction, bil. endoscopic concha bullosa and anterior ethmoid and maxillary antrostomy, ethmoidectomy  . NASAL SINUS SURGERY  08/30/2011   Procedure: ENDOSCOPIC SINUS SURGERY;  Surgeon: Jerrell Belfast, MD;  Location: [facility];  Service: ENT;  Laterality: N/A;  . TUBAL LIGATION     Social History   Socioeconomic History  . Marital status: Divorced    Spouse name: Not on file  . Number of children: Not on file  . Years of education: Not on file  . Highest education level: Not on file  Occupational History   Employer: [employer]  . Financial resource strain: Not on file  . Food insecurity:    Worry: Not on file    Inability: Not on file  . Transportation needs:    Medical: Not on file    Non-medical: Not on file  Tobacco Use  . Smoking status: Never Smoker  . Smokeless tobacco: Never Used  Substance and Sexual Activity  . Alcohol use: No  . Drug use: No  . Sexual activity: Not on file  Lifestyle  . Physical activity:    Days per week: Not on file    Minutes per session: Not on file  . Stress: Not on file  Relationships  . Social connections:    Talks on phone: Not on file    Gets together: Not on file    Attends religious service: Not on file    Active member of club or organization: Not on file    Attends meetings of clubs or organizations: Not on file    Relationship status: Not on file  Other Topics Concern  . Not on file  Social History Narrative  . Not on file   No Known Allergies Family History  Problem Relation Age of Onset  . Leukemia Mother   . Lung cancer Father       Current Outpatient Medications (Respiratory):  .  azelastine (ASTELIN) 0.1 % nasal spray,  Place 1 spray into both nostrils 2 (two) times daily. Use in each nostril as directed .  fluticasone (FLONASE) 50 MCG/ACT nasal spray, Place 2 sprays into both nostrils daily.  Current Outpatient Medications (Analgesics):  .  butalbital-acetaminophen-caffeine (FIORICET, ESGIC) 50-325-40 MG tablet, Take 1-2 tablets by mouth every 6 (six) hours as needed for headache.   Current Outpatient Medications (Other):  Marland Kitchen  AMBULATORY NON FORMULARY MEDICATION, Medication Name: orthopaedic shoes.  For chronic back pain .  AMBULATORY NON FORMULARY MEDICATION, Even up shoe balancer. .  Cholecalciferol (VITAMIN D PO), Take by mouth. 10,000 IU daily .  Turmeric 500 MG CAPS, Take 1 capsule by mouth daily. .  Vitamin D, Ergocalciferol, (DRISDOL) 50000 units CAPS capsule, Take 1 capsule (50,000 Units  total) by mouth every 7 (seven) days. Marland Kitchen  zolpidem (AMBIEN) 10 MG tablet, Take 10 mg by mouth as needed. .  baclofen (LIORESAL) 10 MG tablet, Take 1 tablet (10 mg total) by mouth 3 (three) times daily. (Patient not taking: Reported on 03/17/2018)    Past medical history, social, surgical and family history all reviewed in electronic medical record.  No pertanent information unless stated regarding to the chief complaint.   Review of Systems:  No headache, visual changes, nausea, vomiting, diarrhea, constipation, dizziness, abdominal pain, skin rash, fevers, chills, night sweats, weight loss, swollen lymph nodes, body aches, joint swelling,  chest pain, shortness of breath, mood changes.  Positive muscle aches  Objective  Blood pressure 120/60, pulse 71, height 5\' 7"  (1.702 m), SpO2 98 %.    General: No apparent distress alert and oriented x3 mood and affect normal, dressed appropriately.  HEENT: Pupils equal, extraocular movements intact  Respiratory: Patient's speak in full sentences and does not appear short of breath  Cardiovascular: No lower extremity edema, non tender, no erythema  Skin: Warm dry intact with no signs of infection or rash on extremities or on axial skeleton.  Abdomen: Soft nontender  Neuro: Cranial nerves II through XII are intact, neurovascularly intact in all extremities with 2+ DTRs and 2+ pulses.  Lymph: No lymphadenopathy of posterior or anterior cervical chain or axillae bilaterally.  Gait antalgic MSK:  Non tender with full range of motion and good stability and symmetric strength and tone of shoulders, elbows, wrist, hip, knee and ankles bilaterally.  Patient does have some mild arthritic changes of multiple joints.  Low back exam shows loss of lordosis with mild degenerative scoliosis.  Significant tightness with Corky Sox test bilaterally.  Negative straight leg test.  4+ out of 5 strength of the lower extremities but symmetric.  Deep tendon reflexes intact.  Mild  limitation in rotation and sidebending bilaterally.  Tightness in the right hamstring.  Osteopathic findings C6 flexed rotated and side bent left T3 extended rotated and side bent right inhaled third rib T6 extended rotated and side bent left L4 flexed rotated and side bent right Sacrum right on right    Impression and Recommendations:     This case required medical decision making of moderate complexity. The above documentation has been reviewed and is accurate and complete Lyndal Pulley, DO       Note: This dictation was prepared with Dragon dictation along with smaller phrase technology. Any transcriptional errors that result from this process are unintentional.

## 2018-03-17 ENCOUNTER — Encounter: Payer: Self-pay | Admitting: Family Medicine

## 2018-03-17 ENCOUNTER — Ambulatory Visit: Payer: 59 | Admitting: Family Medicine

## 2018-03-17 VITALS — BP 120/60 | HR 71 | Ht 67.0 in

## 2018-03-17 DIAGNOSIS — M999 Biomechanical lesion, unspecified: Secondary | ICD-10-CM | POA: Diagnosis not present

## 2018-03-17 DIAGNOSIS — G5701 Lesion of sciatic nerve, right lower limb: Secondary | ICD-10-CM | POA: Diagnosis not present

## 2018-03-17 DIAGNOSIS — S76311A Strain of muscle, fascia and tendon of the posterior muscle group at thigh level, right thigh, initial encounter: Secondary | ICD-10-CM | POA: Diagnosis not present

## 2018-03-17 DIAGNOSIS — M940 Chondrocostal junction syndrome [Tietze]: Secondary | ICD-10-CM | POA: Diagnosis not present

## 2018-03-17 NOTE — Assessment & Plan Note (Signed)
Continues to have some difficulty with sleep medicine rib syndrome.  Piriformis seems tighter on the right side as well.  Responds well to manipulation.  Added hamstring exercises secondary to increasing tightness.  I believe that this would be beneficial.  Patient will add this onto her formal physical therapy.  Discussed icing regimen and home exercise.  Follow-up again in 4 to 8 weeks

## 2018-03-17 NOTE — Patient Instructions (Signed)
God to see you  Ice is your friend Stay active Hamstring strain and will have PT work with you  Compression thigh sleeve to right thigh daily for 2 weeks then with exercise or yard work for another 2-4 weeks Ice after a lot of activity  See me again in 6 weeks

## 2018-03-17 NOTE — Assessment & Plan Note (Signed)
Decision today to treat with OMT was based on Physical Exam  After verbal consent patient was treated with HVLA, ME, FPR techniques in cervical, thoracic, rib,  lumbar and sacral areas  Patient tolerated the procedure well with improvement in symptoms  Patient given exercises, stretches and lifestyle modifications  See medications in patient instructions if given  Patient will follow up in 4-8 weeks 

## 2018-03-30 DIAGNOSIS — N8110 Cystocele, unspecified: Secondary | ICD-10-CM | POA: Diagnosis not present

## 2018-03-30 DIAGNOSIS — M545 Low back pain: Secondary | ICD-10-CM | POA: Diagnosis not present

## 2018-03-30 DIAGNOSIS — M6281 Muscle weakness (generalized): Secondary | ICD-10-CM | POA: Diagnosis not present

## 2018-03-30 DIAGNOSIS — N39498 Other specified urinary incontinence: Secondary | ICD-10-CM | POA: Diagnosis not present

## 2018-04-13 DIAGNOSIS — N8110 Cystocele, unspecified: Secondary | ICD-10-CM | POA: Diagnosis not present

## 2018-04-13 DIAGNOSIS — M6281 Muscle weakness (generalized): Secondary | ICD-10-CM | POA: Diagnosis not present

## 2018-04-13 DIAGNOSIS — N39498 Other specified urinary incontinence: Secondary | ICD-10-CM | POA: Diagnosis not present

## 2018-04-13 DIAGNOSIS — M545 Low back pain: Secondary | ICD-10-CM | POA: Diagnosis not present

## 2018-04-16 DIAGNOSIS — H2513 Age-related nuclear cataract, bilateral: Secondary | ICD-10-CM | POA: Diagnosis not present

## 2018-04-16 DIAGNOSIS — H527 Unspecified disorder of refraction: Secondary | ICD-10-CM | POA: Diagnosis not present

## 2018-04-29 ENCOUNTER — Ambulatory Visit: Payer: 59 | Admitting: Family Medicine

## 2018-07-13 ENCOUNTER — Encounter: Payer: Self-pay | Admitting: Family Medicine

## 2018-07-13 ENCOUNTER — Ambulatory Visit (INDEPENDENT_AMBULATORY_CARE_PROVIDER_SITE_OTHER): Payer: No Typology Code available for payment source | Admitting: Family Medicine

## 2018-07-13 VITALS — BP 120/72 | HR 90 | Temp 98.5°F | Ht 67.0 in

## 2018-07-13 DIAGNOSIS — M654 Radial styloid tenosynovitis [de Quervain]: Secondary | ICD-10-CM | POA: Diagnosis not present

## 2018-07-13 DIAGNOSIS — H66001 Acute suppurative otitis media without spontaneous rupture of ear drum, right ear: Secondary | ICD-10-CM | POA: Diagnosis not present

## 2018-07-13 MED ORDER — SACCHAROMYCES BOULARDII 250 MG PO CAPS
250.0000 mg | ORAL_CAPSULE | Freq: Two times a day (BID) | ORAL | 6 refills | Status: DC
Start: 2018-07-13 — End: 2018-11-16

## 2018-07-13 MED ORDER — PREDNISONE 5 MG PO TABS: ORAL_TABLET | ORAL | 0 refills | Status: DC

## 2018-07-13 MED ORDER — CEFDINIR 300 MG PO CAPS: 300.0000 mg | ORAL_CAPSULE | Freq: Two times a day (BID) | ORAL | 0 refills | Status: DC

## 2018-07-13 MED ORDER — FLUCONAZOLE 150 MG PO TABS
ORAL_TABLET | ORAL | 0 refills | Status: DC
Start: 2018-07-13 — End: 2018-11-16

## 2018-07-13 MED FILL — predniSONE 5 MG (21) TBPK: 5 | 6 days supply | Qty: 21 | Fill #0

## 2018-07-13 MED FILL — FLUCONAZOLE 150 MG TABS: 150 | 3 days supply | Qty: 2 | Fill #0

## 2018-07-13 MED FILL — CEFDINIR 300 MG CAPSULE: 300 | 10 days supply | Qty: 20 | Fill #0

## 2018-07-13 NOTE — Patient Instructions (Signed)

## 2018-07-13 NOTE — Progress Notes (Signed)
Sherry [name] is a 63 y.o. female here for an acute visit.  History of Present Illness:   [name], CMA acting as scribe for Dr. Briscoe Deutscher.   Otalgia   This is a new problem. The current episode started today. The problem occurs constantly. The problem has been gradually worsening. There has been no fever. The pain is at a severity of 4/10 (sharp ). Associated symptoms include headaches. Pertinent negatives include no ear discharge, hearing loss, rhinorrhea or sore throat. She has tried nothing for the symptoms. The treatment provided no relief. Her past medical history is significant for a chronic ear infection. as a child    Patient has been seen by ENT in the past. Last visit was around three years ago for a ruptured ear drum in left ear.   PMHx, SurgHx, SocialHx, Medications, and Allergies were reviewed in the Visit Navigator and updated as appropriate.  Current Medications:   .  AMBULATORY NON FORMULARY MEDICATION, Medication Name: orthopaedic shoes.  For chronic back pain, Disp: 2 Device, Rfl: 0 .  AMBULATORY NON FORMULARY MEDICATION, Even up shoe balancer., Disp: 1 each, Rfl: 0 .  azelastine (ASTELIN) 0.1 % nasal spray, Place 1 spray into both nostrils 2 (two) times daily. Use in each nostril as directed, Disp: 30 mL, Rfl: 12 .  butalbital-acetaminophen-caffeine (FIORICET, ESGIC) 50-325-40 MG tablet, Take 1-2 tablets by mouth every 6 (six) hours as needed for headache., Disp: 20 tablet, Rfl: 0 .  fluticasone (FLONASE) 50 MCG/ACT nasal spray, Place 2 sprays into both nostrils daily., Disp: 16 g, Rfl: 6 .  Turmeric 500 MG CAPS, Take 1 capsule by mouth daily., Disp: , Rfl:  .  zolpidem (AMBIEN) 10 MG tablet, Take 10 mg by mouth as needed., Disp: , Rfl:    No Known Allergies   Review of Systems:   Pertinent items are noted in the HPI. Otherwise, ROS is negative.  Vitals:   Vitals:   07/13/18 1356  BP: 120/72  Pulse: 90  Temp: 98.5 F (36.9 C)  TempSrc:  Oral  SpO2: 97%  Height: 5\' 7"  (1.702 m)     Body mass index is 28.98 kg/m.  Physical Exam:   Physical Exam Vitals signs and nursing note reviewed.  HENT:     Head: Normocephalic and atraumatic.     Right Ear: Tympanic membrane is injected and bulging.  Eyes:     Pupils: Pupils are equal, round, and reactive to light.  Neck:     Musculoskeletal: Normal range of motion and neck supple.  Cardiovascular:     Rate and Rhythm: Normal rate and regular rhythm.     Heart sounds: Normal heart sounds.  Pulmonary:     Effort: Pulmonary effort is normal.  Abdominal:     Palpations: Abdomen is soft.  Skin:    General: Skin is warm.  Psychiatric:        Behavior: Behavior normal.    Assessment and Plan:   Tekila was seen today for ear pain.  Diagnoses and all orders for this visit:  Non-recurrent acute suppurative otitis media of right ear without spontaneous rupture of tympanic membrane -     cefdinir (OMNICEF) 300 MG capsule; Take 1 capsule (300 mg total) by mouth 2 (two) times daily. -     fluconazole (DIFLUCAN) 150 MG tablet; One po, then another 3 days later if needed -     predniSONE (DELTASONE) 5 MG tablet; 6-5-4-3-2-1-off -     saccharomyces boulardii (  FLORASTOR) 250 MG capsule; Take 1 capsule (250 mg total) by mouth 2 (two) times daily.  De Quervain's tenosynovitis, right -     Ambulatory referral to Sports Medicine    . Reviewed expectations re: course of current medical issues. . Discussed self-management of symptoms. . Outlined signs and symptoms indicating need for more acute intervention. . Patient verbalized understanding and all questions were answered. Marland Kitchen Health Maintenance issues including appropriate healthy diet, exercise, and smoking avoidance were discussed with patient. . See orders for this visit as documented in the electronic medical record. . Patient received an After Visit Summary.  CMA served as Education administrator during this visit. History, Physical, and  Plan performed by medical provider. The above documentation has been reviewed and is accurate and complete. Briscoe Deutscher, D.O.  Briscoe Deutscher, DO [place], [place] [facility] [place] 07/14/2018

## 2018-07-14 ENCOUNTER — Encounter: Payer: Self-pay | Admitting: Family Medicine

## 2018-07-27 ENCOUNTER — Encounter: Payer: Self-pay | Admitting: Family Medicine

## 2018-09-07 ENCOUNTER — Other Ambulatory Visit: Payer: Self-pay | Admitting: Family Medicine

## 2018-09-07 NOTE — Telephone Encounter (Signed)
Copied from [place] [id number]. Topic: Quick Communication - Rx Refill/Question >> Sep 07, 2018  5:32 PM Mcneil, Ja-Kwan wrote: Medication: azelastine (ASTELIN) 0.1 % nasal spray and fluticasone (FLONASE) 50 MCG/ACT nasal spray  Has the patient contacted their pharmacy? no  Preferred Pharmacy (with phone number or street name): [place], [place] - [place] [phone] (Phone) [phone] (Fax)  Agent: Please be advised that RX refills may take up to 3 business days. We ask that you follow-up with your pharmacy.

## 2018-09-08 MED ORDER — FLUTICASONE PROPIONATE 50 MCG/ACT NA SUSP: 2.0000 | Freq: Every day | NASAL | 6 refills | Status: DC

## 2018-09-08 MED ORDER — AZELASTINE HCL 0.1 % NA SOLN: 1.0000 | Freq: Two times a day (BID) | NASAL | 12 refills | Status: DC

## 2018-09-08 MED FILL — AZELASTINE HCL 137 MCG SPRY: 0.1 | 30 days supply | Qty: 30 | Fill #0

## 2018-09-08 MED FILL — FLUTICASONE PROP 50 MCG SPR: 50 | 30 days supply | Qty: 16 | Fill #0

## 2018-09-08 NOTE — Telephone Encounter (Signed)
Requested Prescriptions  Pending Prescriptions Disp Refills  . azelastine (ASTELIN) 0.1 % nasal spray 30 mL 12    Sig: Place 1 spray into both nostrils 2 (two) times daily. Use in each nostril as directed     Ear, Nose, and Throat: Nasal Preparations - Antiallergy Passed - 09/07/2018  6:52 PM      Passed - Valid encounter within last 12 months    Recent Outpatient Visits          1 month ago Non-recurrent acute suppurative otitis media of right ear without spontaneous rupture of tympanic membrane   [place], [place], DO   5 months ago Right hamstring strain, initial encounter   [place], [place], DO   6 months ago Slipped rib syndrome   [place], Hayden, DO   7 months ago Episodic tension-type headache, not intractable   [place], [place], DO   8 months ago Posterior tibial tendinitis of left lower extremity   [place], Zachary M, DO           . fluticasone (FLONASE) 50 MCG/ACT nasal spray 16 g 6    Sig: Place 2 sprays into both nostrils daily.     Ear, Nose, and Throat: Nasal Preparations - Corticosteroids Passed - 09/07/2018  6:52 PM      Passed - Valid encounter within last 12 months    Recent Outpatient Visits          1 month ago Non-recurrent acute suppurative otitis media of right ear without spontaneous rupture of tympanic membrane   [place], [place], DO   5 months ago Right hamstring strain, initial encounter   [place], Ivor, DO   6 months ago Slipped rib syndrome   [place], [place], DO   7 months ago Episodic tension-type headache, not intractable   [place], [place], DO   8 months ago Posterior tibial tendinitis of left lower extremity   [place], [place], DO

## 2018-11-15 NOTE — Progress Notes (Signed)
[name] [address] [place], [place] [address] Phone: [phone] Subjective:   I [name] am serving as a Education administrator for Dr. Hulan Saas.  I'm seeing this patient by the request  of:    CC: Left wrist pain  PFX:[id number]  ANALYSSA [name] is a 63 y.o. female coming in with complaint of left wrist pain. Would like a wrist injection today.  Patient has been seen for a contralateral de Quervain's synovitis.  Patient did respond well to injection long time ago.  States that this feels mildly similar.  Does not remember any true injury.  Has been doing more repetitive activity.  Denies any numbness or weakness.  States that it can be very sore affecting daily activities.     Past Medical History:  Diagnosis Date  . Deviated septum   . Hepatitis    hx hep b 1984  . Hyperlipemia   . Hypothyroidism   . PONV (postoperative nausea and vomiting)   . Sinusitis, chronic   . Tubular adenoma 10/09/2017   No high-grade dysplasia or malignancy    Past Surgical History:  Procedure Laterality Date  . ANTERIOR CRUCIATE LIGAMENT REPAIR Right   . HAND SURGERY Left   . HERPES SIMPLEX VIRUS DFA    . KNEE SURGERY Left   . NASAL SEPTOPLASTY W/ TURBINOPLASTY  08/30/2011   Procedure: NASAL SEPTOPLASTY WITH TURBINATE REDUCTION;  Surgeon: Jerrell Belfast, MD;  Location: [facility];  Service: ENT;  Laterality: N/A;  nasal septoplasty, bil. inferior turbinated reduction, bil. endoscopic concha bullosa and anterior ethmoid and maxillary antrostomy, ethmoidectomy  . NASAL SINUS SURGERY  08/30/2011   Procedure: ENDOSCOPIC SINUS SURGERY;  Surgeon: Jerrell Belfast, MD;  Location: [facility];  Service: ENT;  Laterality: N/A;  . TUBAL LIGATION     Social History   Socioeconomic History  . Marital status: Divorced    Spouse name: Not on file  . Number of children: Not on file  . Years of education: Not on file  . Highest education level: Not  on file  Occupational History    Employer: [employer]eds  . Financial resource strain: Not on file  . Food insecurity:    Worry: Not on file    Inability: Not on file  . Transportation needs:    Medical: Not on file    Non-medical: Not on file  Tobacco Use  . Smoking status: Never Smoker  . Smokeless tobacco: Never Used  Substance and Sexual Activity  . Alcohol use: No  . Drug use: No  . Sexual activity: Not on file  Lifestyle  . Physical activity:    Days per week: Not on file    Minutes per session: Not on file  . Stress: Not on file  Relationships  . Social connections:    Talks on phone: Not on file    Gets together: Not on file    Attends religious service: Not on file    Active member of club or organization: Not on file    Attends meetings of clubs or organizations: Not on file    Relationship status: Not on file  Other Topics Concern  . Not on file  Social History Narrative  . Not on file   No Known Allergies Family History  Problem Relation Age of Onset  . Leukemia Mother   . Lung cancer Father     Current Outpatient Medications (Endocrine & Metabolic):  .  predniSONE (DELTASONE) 5 MG tablet, 6-5-4-3-2-1-off   Current Outpatient Medications (Respiratory):  .  azelastine (ASTELIN) 0.1 % nasal spray, Place 1 spray into both nostrils 2 (two) times daily. Use in each nostril as directed .  fluticasone (FLONASE) 50 MCG/ACT nasal spray, Place 2 sprays into both nostrils daily.  Current Outpatient Medications (Analgesics):  .  butalbital-acetaminophen-caffeine (FIORICET, ESGIC) 50-325-40 MG tablet, Take 1-2 tablets by mouth every 6 (six) hours as needed for headache.   Current Outpatient Medications (Other):  Marland Kitchen  AMBULATORY NON FORMULARY MEDICATION, Medication Name: orthopaedic shoes.  For chronic back pain .  AMBULATORY NON FORMULARY MEDICATION, Even up shoe balancer. .  cefdinir (OMNICEF) 300 MG capsule, Take 1 capsule (300 mg total) by  mouth 2 (two) times daily. .  fluconazole (DIFLUCAN) 150 MG tablet, One po, then another 3 days later if needed .  saccharomyces boulardii (FLORASTOR) 250 MG capsule, Take 1 capsule (250 mg total) by mouth 2 (two) times daily. .  Turmeric 500 MG CAPS, Take 1 capsule by mouth daily. Marland Kitchen  zolpidem (AMBIEN) 10 MG tablet, Take 10 mg by mouth as needed.    Past medical history, social, surgical and family history all reviewed in electronic medical record.  No pertanent information unless stated regarding to the chief complaint.   Review of Systems:  No headache, visual changes, nausea, vomiting, diarrhea, constipation, dizziness, abdominal pain, skin rash, fevers, chills, night sweats, weight loss, swollen lymph nodes, body aches, joint swelling, muscle aches, chest pain, shortness of breath, mood changes.   Objective  There were no vitals taken for this visit. Systems examined below as of    General: No apparent distress alert and oriented x3 mood and affect normal, dressed appropriately.  HEENT: Pupils equal, extraocular movements intact  Respiratory: Patient's speak in full sentences and does not appear short of breath  Cardiovascular: No lower extremity edema, non tender, no erythema  Skin: Warm dry intact with no signs of infection or rash on extremities or on axial skeleton.  Abdomen: Soft nontender  Neuro: Cranial nerves II through XII are intact, neurovascularly intact in all extremities with 2+ DTRs and 2+ pulses.  Lymph: No lymphadenopathy of posterior or anterior cervical chain or axillae bilaterally.  Gait normal with good balance and coordination.  MSK:  Non tender with full range of motion and good stability and symmetric strength and tone of shoulders, elbows,  hip, knee and ankles bilaterally.  Wrist: Wrist I inspection shows the patient does have some swelling noted over the abductor pollicis longus tendon.  Patient does have positive Finkelstein's test.  Negative Tinel's.  Good  grip strength.  Tender to palpation minorly over the scaphoid but seems to be more over the abductor pollicis longus area.  Procedure: Real-time Ultrasound Guided Injection of left Abductor pollicis longs tendon sheath Device: GE Logiq Q7  Ultrasound guided injection is preferred based studies that show increased duration, increased effect, greater accuracy, decreased procedural pain, increased response rate with ultrasound guided versus blind injection.  Verbal informed consent obtained.  Time-out conducted.  Noted no overlying erythema, induration, or other signs of local infection.  Skin prepped in a sterile fashion.  Local anesthesia: Topical Ethyl chloride.  With sterile technique and under real time ultrasound guidance:  tendon visualized.  23g 5/8 inch needle inserted distal to proximal approach into tendon sheath. Pictures taken  for needle placement. Patient did have injection of 0.5 cc of of 0.5% Marcaine, and 0.5 cc of Kenalog 40 mg/dL. Completed without  difficulty  Pain immediately resolved suggesting accurate placement of the medication.  Advised to call if fevers/chills, erythema, induration, drainage, or persistent bleeding.  Images permanently stored and available for review in the ultrasound unit.  Impression: Technically successful ultrasound guided injection.   Impression and Recommendations:     This case required medical decision making of moderate complexity. The above documentation has been reviewed and is accurate and complete Lyndal Pulley, DO       Note: This dictation was prepared with Dragon dictation along with smaller phrase technology. Any transcriptional errors that result from this process are unintentional.

## 2018-11-16 ENCOUNTER — Ambulatory Visit: Payer: Self-pay

## 2018-11-16 ENCOUNTER — Ambulatory Visit: Payer: No Typology Code available for payment source | Admitting: Family Medicine

## 2018-11-16 ENCOUNTER — Encounter: Payer: Self-pay | Admitting: Family Medicine

## 2018-11-16 ENCOUNTER — Other Ambulatory Visit: Payer: Self-pay

## 2018-11-16 VITALS — BP 144/80 | HR 85 | Ht 67.0 in

## 2018-11-16 DIAGNOSIS — M25532 Pain in left wrist: Secondary | ICD-10-CM

## 2018-11-16 DIAGNOSIS — M654 Radial styloid tenosynovitis [de Quervain]: Secondary | ICD-10-CM

## 2018-11-16 NOTE — Patient Instructions (Signed)
Good to see you  Ice 20 minutes 2 times daily. Usually after activity and before bed. Exercises 3 times a week.  Wear brace day and night for 2 weeks then nightly for 2 weeks.  We wrote a note for work and lets see what they say  See me again in 4 weeks to make sure you are all the way better  Be safe!

## 2018-11-16 NOTE — Assessment & Plan Note (Signed)
Patient.  Discussed icing regimen Discussed bracing which activities to do which was to avoid.  Discussed and given patient a note for work to avoid repetitive motions at work outside of the brace for the next 2 weeks.  Patient will follow-up with me again 4 weeks to make sure patient is improving.

## 2018-12-02 ENCOUNTER — Encounter: Payer: Self-pay | Admitting: Family Medicine

## 2018-12-02 ENCOUNTER — Other Ambulatory Visit: Payer: Self-pay

## 2018-12-02 ENCOUNTER — Ambulatory Visit (INDEPENDENT_AMBULATORY_CARE_PROVIDER_SITE_OTHER): Payer: No Typology Code available for payment source | Admitting: Family Medicine

## 2018-12-02 VITALS — BP 126/74 | HR 68 | Temp 98.6°F | Ht 67.0 in | Wt 191.8 lb

## 2018-12-02 DIAGNOSIS — E538 Deficiency of other specified B group vitamins: Secondary | ICD-10-CM | POA: Diagnosis not present

## 2018-12-02 DIAGNOSIS — Z1159 Encounter for screening for other viral diseases: Secondary | ICD-10-CM | POA: Diagnosis not present

## 2018-12-02 DIAGNOSIS — E559 Vitamin D deficiency, unspecified: Secondary | ICD-10-CM | POA: Diagnosis not present

## 2018-12-02 DIAGNOSIS — G4730 Sleep apnea, unspecified: Secondary | ICD-10-CM

## 2018-12-02 DIAGNOSIS — Z9189 Other specified personal risk factors, not elsewhere classified: Secondary | ICD-10-CM

## 2018-12-02 DIAGNOSIS — Z0000 Encounter for general adult medical examination without abnormal findings: Secondary | ICD-10-CM

## 2018-12-02 DIAGNOSIS — R5383 Other fatigue: Secondary | ICD-10-CM | POA: Diagnosis not present

## 2018-12-02 DIAGNOSIS — Z13220 Encounter for screening for lipoid disorders: Secondary | ICD-10-CM | POA: Diagnosis not present

## 2018-12-02 DIAGNOSIS — Z8619 Personal history of other infectious and parasitic diseases: Secondary | ICD-10-CM

## 2018-12-02 DIAGNOSIS — B351 Tinea unguium: Secondary | ICD-10-CM

## 2018-12-02 DIAGNOSIS — L304 Erythema intertrigo: Secondary | ICD-10-CM

## 2018-12-02 LAB — CBC WITH DIFFERENTIAL/PLATELET
Basophils Absolute: 0 10*3/uL (ref 0.0–0.1)
Basophils Relative: 0.7 % (ref 0.0–3.0)
Eosinophils Absolute: 0.1 10*3/uL (ref 0.0–0.7)
Eosinophils Relative: 1.7 % (ref 0.0–5.0)
HCT: 38.8 % (ref 36.0–46.0)
Hemoglobin: 13.4 g/dL (ref 12.0–15.0)
Lymphocytes Relative: 27.6 % (ref 12.0–46.0)
Lymphs Abs: 1.5 10*3/uL (ref 0.7–4.0)
MCHC: 34.7 g/dL (ref 30.0–36.0)
MCV: 86.4 fl (ref 78.0–100.0)
Monocytes Absolute: 0.4 10*3/uL (ref 0.1–1.0)
Monocytes Relative: 6.4 % (ref 3.0–12.0)
Neutro Abs: 3.5 10*3/uL (ref 1.4–7.7)
Neutrophils Relative %: 63.6 % (ref 43.0–77.0)
Platelets: 234 10*3/uL (ref 150.0–400.0)
RBC: 4.49 Mil/uL (ref 3.87–5.11)
RDW: 12.4 % (ref 11.5–15.5)
WBC: 5.5 10*3/uL (ref 4.0–10.5)

## 2018-12-02 LAB — COMPREHENSIVE METABOLIC PANEL
ALT: 12 U/L (ref 0–35)
AST: 14 U/L (ref 0–37)
Albumin: 4.4 g/dL (ref 3.5–5.2)
Alkaline Phosphatase: 73 U/L (ref 39–117)
BUN: 14 mg/dL (ref 6–23)
CO2: 26 mEq/L (ref 19–32)
Calcium: 9.6 mg/dL (ref 8.4–10.5)
Chloride: 105 mEq/L (ref 96–112)
Creatinine, Ser: 0.92 mg/dL (ref 0.40–1.20)
GFR: 61.36 mL/min (ref >60.00)
Glucose, Bld: 91 mg/dL (ref 70–99)
Potassium: 4 mEq/L (ref 3.5–5.1)
Sodium: 140 mEq/L (ref 135–145)
Total Bilirubin: 0.8 mg/dL (ref 0.2–1.2)
Total Protein: 7.3 g/dL (ref 6.0–8.3)

## 2018-12-02 LAB — VITAMIN D 25 HYDROXY (VIT D DEFICIENCY, FRACTURES): VITD: 49.97 ng/mL (ref 30.00–100.00)

## 2018-12-02 LAB — TSH: TSH: 4.34 u[IU]/mL (ref 0.35–4.50)

## 2018-12-02 LAB — LIPID PANEL
Cholesterol: 267 mg/dL — ABNORMAL HIGH (ref 0–200)
HDL: 71.5 mg/dL (ref >39.00)
LDL Cholesterol: 167 mg/dL — ABNORMAL HIGH (ref 0–99)
NonHDL: 195.8
Total CHOL/HDL Ratio: 4
Triglycerides: 146 mg/dL (ref 0.0–149.0)
VLDL: 29.2 mg/dL (ref 0.0–40.0)

## 2018-12-02 LAB — VITAMIN B12: Vitamin B-12: 161 pg/mL — ABNORMAL LOW (ref 211–911)

## 2018-12-02 MED ORDER — NYSTATIN-TRIAMCINOLONE 100000-0.1 UNIT/GM-% EX OINT: 1.0000 "application " | TOPICAL_OINTMENT | Freq: Two times a day (BID) | CUTANEOUS | 0 refills | Status: DC

## 2018-12-02 MED FILL — NYSTATIN-TRIAMCINOLONE OINT: 100000-0.1 | 15 days supply | Qty: 30 | Fill #0

## 2018-12-02 NOTE — Progress Notes (Signed)
Subjective:    Sherry [name] is a 63 y.o. female and is here for a comprehensive physical exam.  There are no preventive care reminders to display for this patient.   Current Outpatient Medications:  .  AMBULATORY NON FORMULARY MEDICATION, Medication Name: orthopaedic shoes.  For chronic back pain, Disp: 2 Device, Rfl: 0 .  AMBULATORY NON FORMULARY MEDICATION, Even up shoe balancer., Disp: 1 each, Rfl: 0 .  azelastine (ASTELIN) 0.1 % nasal spray, Place 1 spray into both nostrils 2 (two) times daily. Use in each nostril as directed, Disp: 30 mL, Rfl: 12 .  butalbital-acetaminophen-caffeine (FIORICET, ESGIC) 50-325-40 MG tablet, Take 1-2 tablets by mouth every 6 (six) hours as needed for headache., Disp: 20 tablet, Rfl: 0 .  fluticasone (FLONASE) 50 MCG/ACT nasal spray, Place 2 sprays into both nostrils daily., Disp: 16 g, Rfl: 6 .  Turmeric 500 MG CAPS, Take 1 capsule by mouth daily., Disp: , Rfl:  .  zolpidem (AMBIEN) 10 MG tablet, Take 10 mg by mouth as needed., Disp: , Rfl:  .  nystatin-triamcinolone ointment (MYCOLOG), Apply 1 application topically 2 (two) times daily., Disp: 30 g, Rfl: 0 .  terbinafine (LAMISIL) 250 MG tablet, Take 1 tablet (250 mg total) by mouth daily., Disp: 90 tablet, Rfl: 0  PMHx, SurgHx, SocialHx, Medications, and Allergies were reviewed in the Visit Navigator and updated as appropriate.   Past Medical History:  Diagnosis Date  . Deviated septum   . Hepatitis    hx hep b 1984  . Hyperlipemia   . Hypothyroidism   . PONV (postoperative nausea and vomiting)   . Sinusitis, chronic   . Tubular adenoma 10/09/2017   No high-grade dysplasia or malignancy      Past Surgical History:  Procedure Laterality Date  . ANTERIOR CRUCIATE LIGAMENT REPAIR Right   . HAND SURGERY Left   . HERPES SIMPLEX VIRUS DFA    . KNEE SURGERY Left   . NASAL SEPTOPLASTY W/ TURBINOPLASTY  08/30/2011   Procedure: NASAL SEPTOPLASTY WITH TURBINATE REDUCTION;  Surgeon: Jerrell Belfast, MD;  Location: [facility];  Service: ENT;  Laterality: N/A;  nasal septoplasty, bil. inferior turbinated reduction, bil. endoscopic concha bullosa and anterior ethmoid and maxillary antrostomy, ethmoidectomy  . NASAL SINUS SURGERY  08/30/2011   Procedure: ENDOSCOPIC SINUS SURGERY;  Surgeon: Jerrell Belfast, MD;  Location: [facility];  Service: ENT;  Laterality: N/A;  . TUBAL LIGATION       Family History  Problem Relation Age of Onset  . Leukemia Mother   . Lung cancer Father     Social History   Tobacco Use  . Smoking status: Never Smoker  . Smokeless tobacco: Never Used  Substance Use Topics  . Alcohol use: No  . Drug use: No    Review of Systems:   Pertinent items are noted in the HPI. Otherwise, ROS is negative.  Objective:   BP 126/74   Pulse 68   Temp 98.6 F (37 C) (Oral)   Ht 5\' 7"  (1.702 m)   Wt 191 lb 12.8 oz (87 kg)   SpO2 97%   BMI 30.04 kg/m   General appearance: alert, cooperative and appears stated age. Head: normocephalic, without obvious abnormality, atraumatic. Neck: no adenopathy, supple, symmetrical, trachea midline; thyroid not enlarged, symmetric, no tenderness/mass/nodules. Lungs: clear to auscultation bilaterally. Heart: regular rate and rhythm Abdomen: soft, non-tender; no masses,  no organomegaly. Extremities: extremities normal, atraumatic, no cyanosis or edema. Skin: skin  color, texture, turgor normal, no rashes or lesions. Lymph: cervical, supraclavicular, and axillary nodes normal; no abnormal inguinal nodes palpated. Neurologic: grossly normal.  Results for orders placed or performed in visit on 12/02/18  CBC with Differential/Platelet  Result Value Ref Range   WBC 5.5 4.0 - 10.5 K/uL   RBC 4.49 3.87 - 5.11 Mil/uL   Hemoglobin 13.4 12.0 - 15.0 g/dL   HCT 38.8 36.0 - 46.0 %   MCV 86.4 78.0 - 100.0 fl   MCHC 34.7 30.0 - 36.0 g/dL   RDW 12.4 11.5 - 15.5 %   Platelets 234.0 150.0 - 400.0 K/uL    Neutrophils Relative % 63.6 43.0 - 77.0 %   Lymphocytes Relative 27.6 12.0 - 46.0 %   Monocytes Relative 6.4 3.0 - 12.0 %   Eosinophils Relative 1.7 0.0 - 5.0 %   Basophils Relative 0.7 0.0 - 3.0 %   Neutro Abs 3.5 1.4 - 7.7 K/uL   Lymphs Abs 1.5 0.7 - 4.0 K/uL   Monocytes Absolute 0.4 0.1 - 1.0 K/uL   Eosinophils Absolute 0.1 0.0 - 0.7 K/uL   Basophils Absolute 0.0 0.0 - 0.1 K/uL  Comprehensive metabolic panel  Result Value Ref Range   Sodium 140 135 - 145 mEq/L   Potassium 4.0 3.5 - 5.1 mEq/L   Chloride 105 96 - 112 mEq/L   CO2 26 19 - 32 mEq/L   Glucose, Bld 91 70 - 99 mg/dL   BUN 14 6 - 23 mg/dL   Creatinine, Ser 0.92 0.40 - 1.20 mg/dL   Total Bilirubin 0.8 0.2 - 1.2 mg/dL   Alkaline Phosphatase 73 39 - 117 U/L   AST 14 0 - 37 U/L   ALT 12 0 - 35 U/L   Total Protein 7.3 6.0 - 8.3 g/dL   Albumin 4.4 3.5 - 5.2 g/dL   Calcium 9.6 8.4 - 10.5 mg/dL   GFR 61.36 >60.00 mL/min  Lipid panel  Result Value Ref Range   Cholesterol 267 (H) 0 - 200 mg/dL   Triglycerides 146.0 0.0 - 149.0 mg/dL   HDL 71.50 >39.00 mg/dL   VLDL 29.2 0.0 - 40.0 mg/dL   LDL Cholesterol 167 (H) 0 - 99 mg/dL   Total CHOL/HDL Ratio 4    NonHDL 195.80   TSH  Result Value Ref Range   TSH 4.34 0.35 - 4.50 uIU/mL  VITAMIN D 25 Hydroxy (Vit-D Deficiency, Fractures)  Result Value Ref Range   VITD 49.97 30.00 - 100.00 ng/mL  Vitamin B12  Result Value Ref Range   Vitamin B-12 161 (L) 211 - 911 pg/mL  Hepatitis C antibody  Result Value Ref Range   Hepatitis C Ab NON-REACTIVE NON-REACTI   SIGNAL TO CUT-OFF 0.01 <1.00   Assessment/Plan:   Sherry [name] was seen today for annual exam.  Diagnoses and all orders for this visit:  Routine physical examination  Vitamin D deficiency -     VITAMIN D 25 Hydroxy (Vit-D Deficiency, Fractures)  Encounter for hepatitis C virus screening test for high risk patient -     Hepatitis C antibody  Vitamin B 12 deficiency -     Vitamin B12  History of hepatitis B -      US ABDOMEN LIMITED RUQ; Future  Other fatigue -     CBC with Differential/Platelet -     Comprehensive metabolic panel -     TSH -     Ambulatory referral to Sleep Studies  Screening for lipid disorders -  Lipid panel  Sleep disorder breathing -     Ambulatory referral to Sleep Studies  Onychomycosis -     terbinafine (LAMISIL) 250 MG tablet; Take 1 tablet (250 mg total) by mouth daily.  Intertrigo -     nystatin-triamcinolone ointment (MYCOLOG); Apply 1 application topically 2 (two) times daily.    Patient Counseling: [x]    Nutrition: Stressed importance of moderation in sodium/caffeine intake, saturated fat and cholesterol, caloric balance, sufficient intake of fresh fruits, vegetables, fiber, calcium, iron, and 1 mg of folate supplement per day (for females capable of pregnancy).  [x]    Stressed the importance of regular exercise.   [x]    Substance Abuse: Discussed cessation/primary prevention of tobacco, alcohol, or other drug use; driving or other dangerous activities under the influence; availability of treatment for abuse.   [x]    Injury prevention: Discussed safety belts, safety helmets, smoke detector, smoking near bedding or upholstery.   [x]    Sexuality: Discussed sexually transmitted diseases, partner selection, use of condoms, avoidance of unintended pregnancy  and contraceptive alternatives.  [x]    Dental health: Discussed importance of regular tooth brushing, flossing, and dental visits.  [x]    Health maintenance and immunizations reviewed. Please refer to Health maintenance section.   Briscoe Deutscher, DO [place]

## 2018-12-03 ENCOUNTER — Telehealth: Payer: Self-pay | Admitting: Family Medicine

## 2018-12-03 LAB — HEPATITIS C ANTIBODY
Hepatitis C Ab: NONREACTIVE
SIGNAL TO CUT-OFF: 0.01 (ref <1.00)

## 2018-12-03 MED ORDER — TERBINAFINE HCL 250 MG PO TABS: 250.0000 mg | ORAL_TABLET | Freq: Every day | ORAL | 0 refills | Status: AC

## 2018-12-03 MED FILL — TERBINAFINE HCL 250 MG TAB: 250 | 90 days supply | Qty: 90 | Fill #0

## 2018-12-03 NOTE — Telephone Encounter (Signed)
FYI

## 2018-12-03 NOTE — Telephone Encounter (Signed)
See note  Copied from [place] [id number]. Topic: General - Inquiry >> Dec 03, 2018  2:38 PM Mathis Bud wrote: Reason for CRM: Patient called stating that she believes she was prescribed two medications yesterday.  She does not know what the other medication was called. Patient would like call back, patient states it takes 45 mins to get to pharmacy.   Call back # [phone] [place], [place] - [place] [phone] (Phone) [phone] (Fax)

## 2018-12-03 NOTE — Telephone Encounter (Signed)
Terribly sorry. Looks like I forgot to send the Terbinafine. Sent to [facility].

## 2018-12-04 NOTE — Telephone Encounter (Signed)
Called patient l/m to call office  

## 2018-12-04 NOTE — Telephone Encounter (Signed)
Mychart message sent.

## 2018-12-06 ENCOUNTER — Encounter: Payer: Self-pay | Admitting: Family Medicine

## 2018-12-07 ENCOUNTER — Other Ambulatory Visit: Payer: Self-pay

## 2018-12-07 MED ORDER — CYANOCOBALAMIN 1000 MCG/ML IJ SOLN: INTRAMUSCULAR | 0 refills | Status: DC

## 2018-12-07 MED ORDER — "SYRINGE/NEEDLE (DISP) 25G X 1"" 3 ML MISC": 0 refills | Status: DC

## 2018-12-07 MED FILL — BD 3 ML SYRINGE 25GX1": 25G X 1" | 28 days supply | Qty: 4 | Fill #0

## 2018-12-07 MED FILL — BD 3 ML SYRINGE 25GX1: 25G X 1" | 28 days supply | Qty: 4 | Fill #0

## 2018-12-07 MED FILL — CYANOCOBALAMIN 1,000 MCG/ML: 1000 | 28 days supply | Qty: 4 | Fill #0

## 2018-12-10 ENCOUNTER — Encounter: Payer: Self-pay | Admitting: Family Medicine

## 2018-12-14 ENCOUNTER — Ambulatory Visit: Payer: Self-pay

## 2018-12-14 ENCOUNTER — Other Ambulatory Visit: Payer: Self-pay

## 2018-12-14 ENCOUNTER — Ambulatory Visit (INDEPENDENT_AMBULATORY_CARE_PROVIDER_SITE_OTHER): Payer: No Typology Code available for payment source | Admitting: Family Medicine

## 2018-12-14 ENCOUNTER — Encounter: Payer: Self-pay | Admitting: Family Medicine

## 2018-12-14 VITALS — BP 118/78 | HR 73 | Ht 67.0 in

## 2018-12-14 DIAGNOSIS — G5603 Carpal tunnel syndrome, bilateral upper limbs: Secondary | ICD-10-CM | POA: Insufficient documentation

## 2018-12-14 DIAGNOSIS — M25531 Pain in right wrist: Secondary | ICD-10-CM

## 2018-12-14 DIAGNOSIS — M1712 Unilateral primary osteoarthritis, left knee: Secondary | ICD-10-CM | POA: Diagnosis not present

## 2018-12-14 DIAGNOSIS — G5601 Carpal tunnel syndrome, right upper limb: Secondary | ICD-10-CM | POA: Insufficient documentation

## 2018-12-14 DIAGNOSIS — M25532 Pain in left wrist: Secondary | ICD-10-CM

## 2018-12-14 DIAGNOSIS — G5602 Carpal tunnel syndrome, left upper limb: Secondary | ICD-10-CM | POA: Insufficient documentation

## 2018-12-14 MED ORDER — GABAPENTIN 100 MG PO CAPS: 200.0000 mg | ORAL_CAPSULE | Freq: Every day | ORAL | 3 refills | Status: DC

## 2018-12-14 MED FILL — GABAPENTIN 100 MG CAPSULE: 100 | 45 days supply | Qty: 90 | Fill #0

## 2018-12-14 NOTE — Assessment & Plan Note (Signed)
Degenerative left knee with instability noted.  Abnormal thigh to calf ratio.  Status post ACL repair.  At this point I would like patient to be in a custom OA brace and will get this associated.  Patient declined any type injection.  Patient will increase activity slowly.  Follow-up again in 4 to 6 weeks

## 2018-12-14 NOTE — Patient Instructions (Signed)
Good to see you.  See me again in 3-4 weeks   

## 2018-12-14 NOTE — Progress Notes (Signed)
[name] [address] [place], [place] [address] Phone: [phone] Subjective:   [name], am serving as a scribe for Dr. Hulan Saas.    CC: Wrist pain follow-up  TIR:[id number]   11/16/2018: Discussed bracing which activities to do which was to avoid.  Discussed and given patient a note for work to avoid repetitive motions at work outside of the brace for the next 2 weeks.  Patient will follow-up with me again 4 weeks to make sure patient is improving.  Update 12/14/2018: Sherry [name] is a 63 y.o. female coming in with complaint of bilateral wrist pain. Right wrist is now bothering her. Patient states that the left is improving with wearing a brace. Is having pain over the [facility] joint and into the ventral aspect of wrist. Patient states that pain increases following working 12 hour shifts.  Patient feels that the left side is improving significantly but the right side seems to be worsening.  Patient states it does feel different.  Does not understand why though.  Has been trying the same exercises with minimal improvement.      Past Medical History:  Diagnosis Date   Deviated septum    Hepatitis    hx hep b 1984   Hyperlipemia    Hypothyroidism    PONV (postoperative nausea and vomiting)    Sinusitis, chronic    Tubular adenoma 10/09/2017   No high-grade dysplasia or malignancy    Past Surgical History:  Procedure Laterality Date   ANTERIOR CRUCIATE LIGAMENT REPAIR Right    HAND SURGERY Left    HERPES SIMPLEX VIRUS DFA     KNEE SURGERY Left    NASAL SEPTOPLASTY W/ TURBINOPLASTY  08/30/2011   Procedure: NASAL SEPTOPLASTY WITH TURBINATE REDUCTION;  Surgeon: Jerrell Belfast, MD;  Location: [facility];  Service: ENT;  Laterality: N/A;  nasal septoplasty, bil. inferior turbinated reduction, bil. endoscopic concha bullosa and anterior ethmoid and maxillary antrostomy, ethmoidectomy   NASAL SINUS SURGERY   08/30/2011   Procedure: ENDOSCOPIC SINUS SURGERY;  Surgeon: Jerrell Belfast, MD;  Location: [facility];  Service: ENT;  Laterality: N/A;   TUBAL LIGATION     Social History   Socioeconomic History   Marital status: Divorced    Spouse name: Not on file   Number of children: Not on file   Years of education: Not on file   Highest education level: Not on file  Occupational History    Employer: [employer] resource strain: Not on file   Food insecurity    Worry: Not on file    Inability: Not on file   Transportation needs    Medical: Not on file    Non-medical: Not on file  Tobacco Use   Smoking status: Never Smoker   Smokeless tobacco: Never Used  Substance and Sexual Activity   Alcohol use: No   Drug use: No   Sexual activity: Not on file  Lifestyle   Physical activity    Days per week: Not on file    Minutes per session: Not on file   Stress: Not on file  Relationships   Social connections    Talks on phone: Not on file    Gets together: Not on file    Attends religious service: Not on file    Active member of club or organization: Not on file    Attends meetings of clubs or organizations: Not  on file    Relationship status: Not on file  Other Topics Concern   Not on file  Social History Narrative   Not on file   Allergies  Allergen Reactions   Other Other (See Comments)    Antiretroviral, caused jaundice   Family History  Problem Relation Age of Onset   Leukemia Mother    Lung cancer Father       Current Outpatient Medications (Respiratory):    azelastine (ASTELIN) 0.1 % nasal spray, Place 1 spray into both nostrils 2 (two) times daily. Use in each nostril as directed   fluticasone (FLONASE) 50 MCG/ACT nasal spray, Place 2 sprays into both nostrils daily.  Current Outpatient Medications (Analgesics):    butalbital-acetaminophen-caffeine (FIORICET, ESGIC) 50-325-40 MG tablet, Take 1-2  tablets by mouth every 6 (six) hours as needed for headache.  Current Outpatient Medications (Hematological):    cyanocobalamin (,VITAMIN B-12,) 1000 MCG/ML injection, Inject 1 mL IM once weekly for 4 weeks and then once monthly.  Current Outpatient Medications (Other):    AMBULATORY NON FORMULARY MEDICATION, Medication Name: orthopaedic shoes.  For chronic back pain   AMBULATORY NON FORMULARY MEDICATION, Even up shoe balancer.   gabapentin (NEURONTIN) 100 MG capsule, Take 2 capsules (200 mg total) by mouth at bedtime.   nystatin-triamcinolone ointment (MYCOLOG), Apply 1 application topically 2 (two) times daily.   SYRINGE-NEEDLE, DISP, 3 ML 25G X 1" 3 ML MISC, Use to inject B12 IM   terbinafine (LAMISIL) 250 MG tablet, Take 1 tablet (250 mg total) by mouth daily.   Turmeric 500 MG CAPS, Take 1 capsule by mouth daily.   zolpidem (AMBIEN) 10 MG tablet, Take 10 mg by mouth as needed.    Past medical history, social, surgical and family history all reviewed in electronic medical record.  No pertanent information unless stated regarding to the chief complaint.   Review of Systems:  No headache, visual changes, nausea, vomiting, diarrhea, constipation, dizziness, abdominal pain, skin rash, fevers, chills, night sweats, weight loss, swollen lymph nodes, body aches, joint swelling, muscle aches, chest pain, shortness of breath, mood changes.   Objective  Blood pressure 118/78, pulse 73, height 5\' 7"  (1.702 m), SpO2 99 %.    General: No apparent distress alert and oriented x3 mood and affect normal, dressed appropriately.  HEENT: Pupils equal, extraocular movements intact  Respiratory: Patient's speak in full sentences and does not appear short of breath  Cardiovascular: No lower extremity edema, non tender, no erythema  Skin: Warm dry intact with no signs of infection or rash on extremities or on axial skeleton.  Abdomen: Soft nontender  Neuro: Cranial nerves II through XII are  intact, neurovascularly intact in all extremities with 2+ DTRs and 2+ pulses.  Lymph: No lymphadenopathy of posterior or anterior cervical chain or axillae bilaterally.  Gait normal with good balance and coordination.  MSK:  Non tender with full range of motion and good stability and symmetric strength and tone of shoulders, elbows,  hip, and ankles bilaterally.   Left knee exam shows the patient does have some arthritic changes with varus deformity.  Tender to palpation over the medial and patellofemoral joints.  Mild instability with varus and valgus force.  Crepitus noted of the kneecap with mild lateral translation.   Wrist: Right wrist shows the patient does have some mild tenderness with Wynn Maudlin but not very severe.  Patient does have more pain with a Tinel's test over the carpal tunnel area.  Patient does have near full range  of motion.  Mild pain with grind test of the [facility] joint.  Good grip strength  Contralateral wrist still mild Wynn Maudlin but improved from previous exam  MSK US performed of: Right wrist This study was ordered, performed, and interpreted by Charlann Boxer D.O.  Wrist: All extensor compartments visualized and tendons all normal in appearance without fraying, tears, or sheath effusions. No effusion seen. TFCC intact. Scapholunate ligament intact. Carpal tunnel visualized and median nerve area minorly enlarged mild hypoechoic changes Power doppler signal normal.  IMPRESSION: Mild carpal tunnel syndrome    Impression and Recommendations:     This case required medical decision making of moderate complexity. The above documentation has been reviewed and is accurate and complete Lyndal Pulley, DO       Note: This dictation was prepared with Dragon dictation along with smaller phrase technology. Any transcriptional errors that result from this process are unintentional.

## 2018-12-14 NOTE — Assessment & Plan Note (Signed)
Mild carpal tunnel.  Seems to be in with some mild enlargement with an area of the 0.15 cm of the median nerve.  Discussed bracing at night, home exercises, and gabapentin as prescribed.  Warned of potential side effects.  Follow-up again in 4 to 6 weeks

## 2018-12-21 ENCOUNTER — Other Ambulatory Visit: Payer: Self-pay

## 2018-12-21 ENCOUNTER — Encounter: Payer: Self-pay | Admitting: Neurology

## 2018-12-21 ENCOUNTER — Ambulatory Visit: Payer: No Typology Code available for payment source | Admitting: Neurology

## 2018-12-21 VITALS — BP 147/80 | HR 85 | Temp 98.0°F | Ht 67.0 in | Wt 193.0 lb

## 2018-12-21 DIAGNOSIS — M654 Radial styloid tenosynovitis [de Quervain]: Secondary | ICD-10-CM | POA: Diagnosis not present

## 2018-12-21 DIAGNOSIS — G4719 Other hypersomnia: Secondary | ICD-10-CM | POA: Diagnosis not present

## 2018-12-21 DIAGNOSIS — M25511 Pain in right shoulder: Secondary | ICD-10-CM | POA: Diagnosis not present

## 2018-12-21 DIAGNOSIS — R0683 Snoring: Secondary | ICD-10-CM

## 2018-12-21 DIAGNOSIS — M2480 Other specific joint derangements of unspecified joint, not elsewhere classified: Secondary | ICD-10-CM

## 2018-12-21 DIAGNOSIS — J301 Allergic rhinitis due to pollen: Secondary | ICD-10-CM

## 2018-12-21 DIAGNOSIS — M940 Chondrocostal junction syndrome [Tietze]: Secondary | ICD-10-CM

## 2018-12-21 DIAGNOSIS — Z8659 Personal history of other mental and behavioral disorders: Secondary | ICD-10-CM

## 2018-12-21 NOTE — Patient Instructions (Signed)

## 2018-12-21 NOTE — Progress Notes (Signed)
SLEEP MEDICINE CLINIC    Provider:  Larey Seat, MD  Primary Care Physician:  Briscoe Deutscher, [place] [address]     Referring Provider: Briscoe Deutscher, [address] [place],  [place] [address]          Chief Complaint according to patient   Patient presents with:    . New Patient (Initial Visit)           HISTORY OF PRESENT ILLNESS:  Sherry [name] is a 63 y.o. year old White or Caucasian female patient seen here upon  referral on 12/21/2018 from Dr Juleen China . Chief concern according to patient : Excessive daytime sleepiness    I have the pleasure of seeing Sherry [name] SWFUXNATFT today, a right -handed White or Caucasian female with a possible sleep disorder.  She struggles with hypersomnia at work [employer] at [facility] ) , while driving.  She also had trouble to sleep at night.  She has a  has a past medical history of Deviated septum, Hepatitis, Hyperlipemia, Hypothyroidism, PONV (postoperative nausea and vomiting), Sinusitis, chronic, and Tubular adenoma (10/09/2017).  White coat hypertension. She had several ruptured tendons and joint fractures.  ACL reconstruction.   The patient had the first sleep study in the year 2007 with Dr. Danton Sewer  - with a result of an AHI ( Apnea Hypopnea index)  of less than 5 - snoring indicated by a higher RDI ( Respiratory Disturbance Index) , an oxygen saturation Nadir at SP02 85%.  She reports mouth breathing and waking with a dry mouth, having trouble to exhale sometimes. She underwent surgery for septum nasi and turbinate reduction, which reduced her previously frequent sinusitis drastically.   Sleep relevant medical history: Sleep walkin, Night terrors, Parasomnia were denied , Tonsillectomy- none , cervical spine surgery none but deviated septum repair.   Family medical /sleep history: No other family member on CPAP with OSA, insomnia, sleep walkers. mother died when she was 33, father was a  loud snorer.  Both sons were evaluated for Marfans, youngest son has dilated aortic arch. Elder son has sternum cavus , younger has pectus.   Social history:  Patient is working as an Therapist, sports at [employer] and lives in a household with 3 persons/ her 2 sons living with her . Family status is divorced, with 2 sons, 2 and 50 years old.  The patient currently works in shifts( Presenter, broadcasting,) Pets are present. 3 dogs.  Tobacco use: none .  ETOH use; none ,  Caffeine intake in form of Coffee( none ) Soda ( 3-4  Before 4 PM ) Tea ( rare ) or energy drinks. Regular exercise in form of walking. Hobbies :gardening  Sleep habits are as follows: The patient's dinner time is between 4-6 PM. The patient goes to bed at 11 PM and sleeps until 8 on days off. Ambien helps to fall asleep- she continues to sleep for 3 hours, wakes for the bathroom breaks, the first time at 2 AM,and again at 5 AM. mostly goes back to sleep.  On work days gets up at 5 AM- needs an alarm. .   The preferred sleep position is left lateral , with the support of 1-2 pillows.  She has hip pain , sometimes will need to go to the recliner.  Dreams are reportedly rare.  5  AM is the usual rise time. The patient wakes up with one alarm.  She reports not feeling refreshed or restored in AM, with symptoms such as dry mouth but no morning headaches. She wakes with residual fatigue.  Naps are taken in frequently, lasting from 1 to 2 hours and are more less refreshing than nocturnal sleep.    Review of Systems: Out of a complete 14 system review, the patient complains of only the following symptoms, and all other reviewed systems are negative.:  Fatigue, sleepiness , snoring,  Insomnia   How likely are you to doze in the following situations: 0 = not likely, 1 = slight chance, 2 = moderate chance, 3 = high chance   Sitting and Reading?3  Watching Television? 2 Sitting inactive in a public place (theater or meeting)?3 As a passenger in a car for an  hour without a break? 3- since childhood  Lying down in the afternoon when circumstances permit?3 Sitting and talking to someone?0 Sitting quietly after lunch without alcohol? 3 In a car, while stopped for a few minutes in traffic?0   Total = 17/ 24 points   FSS endorsed at 47/ 63 points.   Social History   Socioeconomic History  . Marital status: Divorced    Spouse name: Not on file  . Number of children: Not on file  . Years of education: Not on file  . Highest education level: Not on file  Occupational History    Employer: [employer]  . Financial resource strain: Not on file  . Food insecurity    Worry: Not on file    Inability: Not on file  . Transportation needs    Medical: Not on file    Non-medical: Not on file  Tobacco Use  . Smoking status: Never Smoker  . Smokeless tobacco: Never Used  Substance and Sexual Activity  . Alcohol use: No  . Drug use: No  . Sexual activity: Not on file  Lifestyle  . Physical activity    Days per week: Not on file    Minutes per session: Not on file  . Stress: Not on file  Relationships  . Social Herbalist on phone: Not on file    Gets together: Not on file    Attends religious service: Not on file    Active member of club or organization: Not on file    Attends meetings of clubs or organizations: Not on file    Relationship status: Not on file  Other Topics Concern  . Not on file  Social History Narrative  . Not on file    Family History  Problem Relation Age of Onset  . Leukemia Mother   . Lung cancer Father     Past Medical History:  Diagnosis Date  . Deviated septum   . Hepatitis    hx hep b 1984  . Hyperlipemia   . Hypothyroidism   . PONV (postoperative nausea and vomiting)   . Sinusitis, chronic   . Tubular adenoma 10/09/2017   No high-grade dysplasia or malignancy     Past Surgical History:  Procedure Laterality Date  . ANTERIOR CRUCIATE LIGAMENT REPAIR Right   . HAND  SURGERY Left   . HERPES SIMPLEX VIRUS DFA    . KNEE SURGERY Left   . NASAL SEPTOPLASTY W/ TURBINOPLASTY  08/30/2011   Procedure: NASAL SEPTOPLASTY WITH TURBINATE REDUCTION;  Surgeon: Jerrell Belfast, MD;  Location: [facility];  Service: ENT;  Laterality: N/A;  nasal septoplasty, bil. inferior turbinated reduction, bil. endoscopic concha  bullosa and anterior ethmoid and maxillary antrostomy, ethmoidectomy  . NASAL SINUS SURGERY  08/30/2011   Procedure: ENDOSCOPIC SINUS SURGERY;  Surgeon: Jerrell Belfast, MD;  Location: [facility];  Service: ENT;  Laterality: N/A;  . TUBAL LIGATION       Current Outpatient Medications on File Prior to Visit  Medication Sig Dispense Refill  . AMBULATORY NON FORMULARY MEDICATION Medication Name: orthopaedic shoes.  For chronic back pain 2 Device 0  . AMBULATORY NON FORMULARY MEDICATION Even up shoe balancer. 1 each 0  . azelastine (ASTELIN) 0.1 % nasal spray Place 1 spray into both nostrils 2 (two) times daily. Use in each nostril as directed 30 mL 12  . butalbital-acetaminophen-caffeine (FIORICET, ESGIC) 50-325-40 MG tablet Take 1-2 tablets by mouth every 6 (six) hours as needed for headache. 20 tablet 0  . cyanocobalamin (,VITAMIN B-12,) 1000 MCG/ML injection Inject 1 mL IM once weekly for 4 weeks and then once monthly. 8 mL 0  . fluticasone (FLONASE) 50 MCG/ACT nasal spray Place 2 sprays into both nostrils daily. 16 g 6  . gabapentin (NEURONTIN) 100 MG capsule Take 2 capsules (200 mg total) by mouth at bedtime. 90 capsule 3  . nystatin-triamcinolone ointment (MYCOLOG) Apply 1 application topically 2 (two) times daily. 30 g 0  . SYRINGE-NEEDLE, DISP, 3 ML 25G X 1" 3 ML MISC Use to inject B12 IM 20 each 0  . terbinafine (LAMISIL) 250 MG tablet Take 1 tablet (250 mg total) by mouth daily. 90 tablet 0  . Turmeric 500 MG CAPS Take 1 capsule by mouth daily.    Marland Kitchen zolpidem (AMBIEN) 10 MG tablet Take 10 mg by mouth as needed.     No current  facility-administered medications on file prior to visit.     Allergies  Allergen Reactions  . Other Other (See Comments)    Antiretroviral, caused jaundice    Physical exam:  Today's Vitals   12/21/18 0929  BP: (!) 147/80  Pulse: 85  Temp: 98 F (36.7 C)  Weight: 193 lb (87.5 kg)  Height: 5\' 7"  (1.702 m)   Body mass index is 30.23 kg/m.   Wt Readings from Last 3 Encounters:  12/21/18 193 lb (87.5 kg)  12/02/18 191 lb 12.8 oz (87 kg)  01/20/18 185 lb (83.9 kg)     Ht Readings from Last 3 Encounters:  12/21/18 5\' 7"  (1.702 m)  12/14/18 5\' 7"  (1.702 m)  12/02/18 5\' 7"  (1.702 m)      General: The patient is awake, alert and appears not in acute distress. The patient is well groomed. Head: Normocephalic, atraumatic. Neck is supple. Mallampati 1- but it is a very shallow airway, steep,  neck circumference:15 inches . Nasal airflow patent.  Retrognathia is not seen.  Dental status:  Cardiovascular:  Regular rate and cardiac rhythm by pulse,  without distended neck veins. Respiratory: Lungs are clear to auscultation.  Skin:  Without evidence of ankle edema, or rash. Trunk: The patient's posture is erect.   Neurologic exam : The patient is awake and alert, oriented to place and time.   Memory subjective described as intact.  Attention span & concentration ability appears normal.  Speech is fluent,  without  dysarthria, dysphonia or aphasia.  Mood and affect are appropriate.   Cranial nerves: no loss of smell or taste reported  Pupils are equal and briskly reactive to light. Funduscopic exam deferred. .  Extraocular movements in vertical and horizontal planes were intact and without nystagmus. No  Diplopia. Visual fields by finger perimetry are intact. Hearing was intact to soft voice and finger rubbing.    Facial sensation intact to fine touch.  Facial motor strength is symmetric and tongue and uvula move midline.  Neck ROM : rotation, tilt and flexion extension were  normal for age and shoulder shrug was asymmetrical.  Right shoulder is lower. Pronator drift on the right.    Motor exam:  Symmetric bulk, tone and ROM for upper and lower extremities.   Normal tone without cog -wheeling, symmetric grip strength .   Sensory:  Fine touch, pinprick and vibration were tested  and  normal.  Proprioception tested in the upper extremities was normal.   Coordination: Rapid alternating movements in the fingers/hands were of normal speed.  The patient has noted a change in handwriting.  She has de Quorvaine tendinitis.  The Finger-to-nose maneuver was intact without evidence of ataxia, dysmetria or tremor.   Gait and station: Patient could rise unassisted from a seated position, walked without assistive device.  Stance is of normal width/ base and the patient turned with 3 steps.  Toe and heel walk were deferred.  Deep tendon reflexes: in the upper and lower extremities are symmetric and intact.  Babinski response was deferred .       After spending a total time of 35 minutes face to face and additional time for physical and neurologic examination, review of laboratory studies,  personal review of imaging studies, reports and results of other testing and review of referral information / records as far as provided in visit, I have established the following assessments:  1) There is a long standing history of hypersomnia and in the past not associated with apnea. This may have changed over the last 13 years. She underwent ENT surgery and she has more fatigue and sleepiness than ever before.  2) no associated cataplexy, no dream intrusion or sleep paralysis. 3) spells of "aura" ,a feeling of a wave of weakness, which can last 20 minutes, with no loss of awareness.     My Plan is to proceed with:  1)  Hypersomnia evaluation , related to snoring / may be apnea.  2)  Fatigue and spontaneous arousals- need to look at sleep architecture. She has often pain, and has  hyperextendible joints. Marfan, ehlers danlos?  3)  PSG parasomnia is ordered.   I would like to thank Briscoe Deutscher, DO and Dr Tamala Julian , [place],  for allowing me to meet with and to take care of this pleasant patient.   In short, Sherry [name] is presenting with high fatigue and sleepiness , a symptom that can be attributed to OSA, and can be related to depression.    I plan to follow up either personally or through our NP within 2-3 month.    Electronically signed by: Larey Seat, MD 12/21/2018 9:40 AM  [facility] Board certified by The [employer] of Sleep Medicine  and Diplomate of the [employer] of Sleep Medicine. Board certified In Neurology through the [place],  Fellow of the [employer] of Neurology. Medical Director of [facility].

## 2018-12-31 ENCOUNTER — Other Ambulatory Visit: Payer: Self-pay

## 2018-12-31 ENCOUNTER — Ambulatory Visit
Admission: RE | Admit: 2018-12-31 | Discharge: 2018-12-31 | Disposition: A | Discharge status: Home / Self Care | Payer: No Typology Code available for payment source | Source: Ambulatory Visit | Attending: Family Medicine | Admitting: Family Medicine

## 2018-12-31 DIAGNOSIS — Z8619 Personal history of other infectious and parasitic diseases: Secondary | ICD-10-CM

## 2019-01-12 ENCOUNTER — Other Ambulatory Visit: Payer: Self-pay

## 2019-01-12 ENCOUNTER — Encounter: Payer: Self-pay | Admitting: Family Medicine

## 2019-01-12 ENCOUNTER — Ambulatory Visit (INDEPENDENT_AMBULATORY_CARE_PROVIDER_SITE_OTHER): Payer: No Typology Code available for payment source | Admitting: Family Medicine

## 2019-01-12 VITALS — BP 118/70 | HR 57 | Ht 67.0 in | Wt 190.0 lb

## 2019-01-12 DIAGNOSIS — M25539 Pain in unspecified wrist: Secondary | ICD-10-CM | POA: Diagnosis not present

## 2019-01-12 DIAGNOSIS — M1712 Unilateral primary osteoarthritis, left knee: Secondary | ICD-10-CM | POA: Diagnosis not present

## 2019-01-12 DIAGNOSIS — M654 Radial styloid tenosynovitis [de Quervain]: Secondary | ICD-10-CM

## 2019-01-12 MED ORDER — NITROGLYCERIN 0.1 MG/HR TD PT24: MEDICATED_PATCH | TRANSDERMAL | 12 refills | Status: DC

## 2019-01-12 MED FILL — NITROGLYCERIN 0.1 MG/HR PTC: 0.1 | 90 days supply | Qty: 30 | Fill #0

## 2019-01-12 MED FILL — CYANOCOBALAMIN 1,000 MCG/ML: 1000 | 28 days supply | Qty: 4 | Fill #1

## 2019-01-12 MED FILL — BD 3 ML SYRINGE 25GX1": 25G X 1" | 28 days supply | Qty: 4 | Fill #1

## 2019-01-12 MED FILL — BD 3 ML SYRINGE 25GX1: 25G X 1" | 28 days supply | Qty: 4 | Fill #1

## 2019-01-12 NOTE — Progress Notes (Signed)
[name] [place], [place] [address] Phone: [phone] Subjective:   I Sherry [name] am serving as a Education administrator for Dr. Hulan Saas.    CC: Wrist and knee follow-up  DGL:[id number]   12/14/2018 Mild carpal tunnel.  Seems to be in with some mild enlargement with an area of the 0.15 cm of the median nerve.  Discussed bracing at night, home exercises, and gabapentin as prescribed.  Warned of potential side effects.  Follow-up again in 4 to 6 weeks  Degenerative left knee with instability noted.  Abnormal thigh to calf ratio.  Status post ACL repair.  At this point I would like patient to be in a custom OA brace and will get this associated.  Patient declined any type injection.  Patient will increase activity slowly.  Follow-up again in 4 to 6 weeks  01/12/2019 Sherry [name] JOACZYSAYT is a 63 y.o. female coming in with complaint of bilateral wrist and knee pain. States that the wrist is not doing well. States she is having issues with the knee brace. Brace is irritating the medial side of her knee. She says that she has sores on the medial side of the left knee.   Patient feels like it is secondary to the brace more than previously needed.  No instability noted in the brace itself.        Past Medical History:  Diagnosis Date   Deviated septum    Hepatitis    hx hep b 1984   Hyperlipemia    Hypothyroidism    PONV (postoperative nausea and vomiting)    Sinusitis, chronic    Tubular adenoma 10/09/2017   No high-grade dysplasia or malignancy    Past Surgical History:  Procedure Laterality Date   ANTERIOR CRUCIATE LIGAMENT REPAIR Right    HAND SURGERY Left    HERPES SIMPLEX VIRUS DFA     KNEE SURGERY Left    NASAL SEPTOPLASTY W/ TURBINOPLASTY  08/30/2011   Procedure: NASAL SEPTOPLASTY WITH TURBINATE REDUCTION;  Surgeon: Jerrell Belfast, MD;  Location: [place] [facility];  Service: ENT;  Laterality: N/A;  nasal septoplasty,  bil. inferior turbinated reduction, bil. endoscopic concha bullosa and anterior ethmoid and maxillary antrostomy, ethmoidectomy   NASAL SINUS SURGERY  08/30/2011   Procedure: ENDOSCOPIC SINUS SURGERY;  Surgeon: Jerrell Belfast, MD;  Location: [facility];  Service: ENT;  Laterality: N/A;   TUBAL LIGATION     Social History   Socioeconomic History   Marital status: Divorced    Spouse name: Not on file   Number of children: Not on file   Years of education: Not on file   Highest education level: Not on file  Occupational History    Employer: [employer] resource strain: Not on file   Food insecurity    Worry: Not on file    Inability: Not on file   Transportation needs    Medical: Not on file    Non-medical: Not on file  Tobacco Use   Smoking status: Never Smoker   Smokeless tobacco: Never Used  Substance and Sexual Activity   Alcohol use: No   Drug use: No   Sexual activity: Not on file  Lifestyle   Physical activity    Days per week: Not on file    Minutes per session: Not on file   Stress: Not on file  Relationships   Social connections    Talks on phone:  Not on file    Gets together: Not on file    Attends religious service: Not on file    Active member of club or organization: Not on file    Attends meetings of clubs or organizations: Not on file    Relationship status: Not on file  Other Topics Concern   Not on file  Social History Narrative   Not on file   Allergies  Allergen Reactions   Other Other (See Comments)    Antiretroviral, caused jaundice   Family History  Problem Relation Age of Onset   Leukemia Mother    Lung cancer Father      Current Outpatient Medications (Cardiovascular):    nitroGLYCERIN (NITRO-DUR) 0.1 mg/hr patch, 1/4 patch daily  Current Outpatient Medications (Respiratory):    azelastine (ASTELIN) 0.1 % nasal spray, Place 1 spray into both nostrils 2 (two) times  daily. Use in each nostril as directed   fluticasone (FLONASE) 50 MCG/ACT nasal spray, Place 2 sprays into both nostrils daily.  Current Outpatient Medications (Analgesics):    butalbital-acetaminophen-caffeine (FIORICET, ESGIC) 50-325-40 MG tablet, Take 1-2 tablets by mouth every 6 (six) hours as needed for headache.  Current Outpatient Medications (Hematological):    cyanocobalamin (,VITAMIN B-12,) 1000 MCG/ML injection, Inject 1 mL IM once weekly for 4 weeks and then once monthly.  Current Outpatient Medications (Other):    AMBULATORY NON FORMULARY MEDICATION, Medication Name: orthopaedic shoes.  For chronic back pain   AMBULATORY NON FORMULARY MEDICATION, Even up shoe balancer.   gabapentin (NEURONTIN) 100 MG capsule, Take 2 capsules (200 mg total) by mouth at bedtime.   nystatin-triamcinolone ointment (MYCOLOG), Apply 1 application topically 2 (two) times daily.   SYRINGE-NEEDLE, DISP, 3 ML 25G X 1" 3 ML MISC, Use to inject B12 IM   terbinafine (LAMISIL) 250 MG tablet, Take 1 tablet (250 mg total) by mouth daily.   Turmeric 500 MG CAPS, Take 1 capsule by mouth daily.   zolpidem (AMBIEN) 10 MG tablet, Take 10 mg by mouth as needed.    Past medical history, social, surgical and family history all reviewed in electronic medical record.  No pertanent information unless stated regarding to the chief complaint.   Review of Systems:  No headache, visual changes, nausea, vomiting, diarrhea, constipation, dizziness, abdominal pain, skin rash, fevers, chills, night sweats, weight loss, swollen lymph nodes, body aches, joint swelling,  chest pain, shortness of breath, mood changes.  Positive muscle aches  Objective  Blood pressure 118/70, pulse (!) 57, height 5\' 7"  (1.702 m), weight 190 lb (86.2 kg), SpO2 96 %.     General: No apparent distress alert and oriented x3 mood and affect normal, dressed appropriately.  HEENT: Pupils equal, extraocular movements intact  Respiratory:  Patient's speak in full sentences and does not appear short of breath  Cardiovascular: No lower extremity edema, non tender, no erythema  Skin: Warm dry intact with no signs of infection or rash on extremities or on axial skeleton.  Abdomen: Soft nontender  Neuro: Cranial nerves II through XII are intact, neurovascularly intact in all extremities with 2+ DTRs and 2+ pulses.  Lymph: No lymphadenopathy of posterior or anterior cervical chain or axillae bilaterally.  Gait antalgic MSK:  Non tender with full range of motion and good stability and symmetric strength and tone of shoulders, elbows, , hip, and ankles bilaterally.  Abie  Inspection normal with no visible erythema or swelling. ROM smooth and normal with good flexion and extension and ulnar/radial deviation that is  symmetrical with opposite wrist. Palpation is normal over metacarpals, navicular, lunate, and TFCC; tendons without tenderness/ swelling No snuffbox tenderness. No tenderness over Canal of Gu[place]. Strength 5/5 in all directions without pain. Positive Finkelstein, tinel's and phalens. Negative Watson's test. Knee:left  valgus deformity noted. Abnormal thigh to calf ratio.  Tender to palpation over medial and PF joint line.  ROM full in flexion and extension and lower leg rotation. instability with valgus force.  painful patellar compression. Patellar glide with moderate crepitus. Patellar and quadriceps tendons unremarkable. Hamstring and quadriceps strength is normal. Contralateral knee shows mild OA     Impression and Recommendations:     This case required medical decision making of moderate complexity. The above documentation has been reviewed and is accurate and complete Lyndal Pulley, DO       Note: This dictation was prepared with Dragon dictation along with smaller phrase technology. Any transcriptional errors that result from this process are unintentional.

## 2019-01-12 NOTE — Assessment & Plan Note (Signed)
Continues to have pain.  Still too soon to repeat the injections.  We discussed icing regimen and home exercises.  Patient will be sent to formal physical therapy that I think will be beneficial.  We discussed which activities to do which wants to avoid.  Patient can continue the bracing.  Discussed the possibility of PRP.  Start nitroglycerin patches.  Follow-up again 4 to 6 weeks

## 2019-01-12 NOTE — Patient Instructions (Signed)
Good to see you.  Ice 20 minutes 2 times daily. Usually after activity and before bed. Call Linton Rump to make adjustments to knee brace Wear wrist brace as tolerated  See me again in 3-6 weeks   Nitroglycerin Protocol   Apply 1/4 nitroglycerin patch to affected area daily.  Change position of patch within the affected area every 24 hours.  You may experience a headache during the first 1-2 weeks of using the patch, these should subside.  If you experience headaches after beginning nitroglycerin patch treatment, you may take your preferred over the counter pain reliever.  Another side effect of the nitroglycerin patch is skin irritation or rash related to patch adhesive.  Please notify our office if you develop more severe headaches or rash, and stop the patch.  Tendon healing with nitroglycerin patch may require 12 to 24 weeks depending on the extent of injury.  Men should not use if taking Viagra, Cialis, or Levitra.   Do not use if you have migraines or rosacea.

## 2019-01-12 NOTE — Assessment & Plan Note (Signed)
Continues to have pain.  We discussed the possibility of injections with patient declined at the moment.  Patient will continue with the custom brace.  Wants to hold on any other surgical interventions.  Do not feel further imaging is necessary.

## 2019-01-25 ENCOUNTER — Encounter: Payer: Self-pay | Admitting: Family Medicine

## 2019-01-29 ENCOUNTER — Other Ambulatory Visit: Payer: Self-pay

## 2019-01-29 ENCOUNTER — Ambulatory Visit (INDEPENDENT_AMBULATORY_CARE_PROVIDER_SITE_OTHER): Payer: No Typology Code available for payment source | Admitting: Rehabilitative and Restorative Service Providers"

## 2019-01-29 ENCOUNTER — Encounter: Payer: Self-pay | Admitting: Rehabilitative and Restorative Service Providers"

## 2019-01-29 DIAGNOSIS — R29898 Other symptoms and signs involving the musculoskeletal system: Secondary | ICD-10-CM

## 2019-01-29 DIAGNOSIS — M6281 Muscle weakness (generalized): Secondary | ICD-10-CM | POA: Diagnosis not present

## 2019-01-29 DIAGNOSIS — M25532 Pain in left wrist: Secondary | ICD-10-CM | POA: Diagnosis not present

## 2019-01-29 NOTE — Patient Instructions (Addendum)
IONTOPHORESIS PATIENT PRECAUTIONS & CONTRAINDICATIONS:  . Redness under one or both electrodes can occur.  This characterized by a uniform redness that usually disappears within 12 hours of treatment. . Small pinhead size blisters may result in response to the drug.  Contact your physician if the problem persists more than 24 hours. . On rare occasions, iontophoresis therapy can result in temporary skin reactions such as rash, inflammation, irritation or burns.  The skin reactions may be the result of individual sensitivity to the ionic solution used, the condition of the skin at the start of treatment, reaction to the materials in the electrodes, allergies or sensitivity to dexamethasone, or a poor connection between the patch and your skin.  Discontinue using iontophoresis if you have any of these reactions and report to your therapist. . Remove the Patch or electrodes if you have any undue sensation of pain or burning during the treatment and report discomfort to your therapist. . Tell your Therapist if you have had known adverse reactions to the application of electrical current. . If using the Patch, the LED light will turn off when treatment is complete and the patch can be removed.  Approximate treatment time is 1-3 hours.  Remove the patch when light goes off or after 6 hours. . The Patch can be worn during normal activity, however excessive motion where the electrodes have been placed can cause poor contact between the skin and the electrode or uneven electrical current resulting in greater risk of skin irritation. Marland Kitchen Keep out of the reach of children.   . DO NOT use if you have a cardiac pacemaker or any other electrically sensitive implanted device. . DO NOT use if you have a known sensitivity to dexamethasone. . DO NOT use during Magnetic Resonance Imaging (MRI). . DO NOT use over broken or compromised skin (e.g. sunburn, cuts, or acne) due to the increased risk of skin reaction. . DO  NOT SHAVE over the area to be treated:  To establish good contact between the Patch and the skin, excessive hair may be clipped. . DO NOT place the Patch or electrodes on or over your eyes, directly over your heart, or brain. . DO NOT reuse the Patch or electrodes as this may cause burns to occur.  Access Code: [email or web address]  URL: [email or web address]  Date: 01/29/2019  Prepared by: Gillermo Murdoch   Exercises  Seated Wrist Radial Ulnar Deviation PROM - 3 reps - 1 sets - 15-20sec hold - 2x daily - 7x weekly  Seated Wrist Radial Deviation PROM - 3 reps - 1 sets - 15-20 sec hold - 2x daily - 7x weekly  Wrist Extension Stretch Pronated - 3 reps - 1 sets - 15-20 sec hold - 2x daily - 7x weekly  Seated Wrist Flexion Stretch - 3 reps - 1 sets - 15-20 sec hold - 2x daily - 7x weekly  Thumb AROM MP Blocking - 3-5 reps - 1 sets - 3-5sec hold - 2x daily - 7x weekly  Patient Education  TENS Unit

## 2019-01-29 NOTE — Therapy (Signed)
[facility] [place], [place], [address] Phone: [phone]   Fax:  [phone]  Physical Therapy Evaluation  Patient Details  Name: Sherry [name] MRN: [id number] Date of Birth: 02/02/1956 Referring Provider (PT): Dr Hulan Saas    Encounter Date: 01/29/2019  PT End of Session - 01/29/19 1438    Visit Number  1    Number of Visits  12    Date for PT Re-Evaluation  03/12/19    PT Start Time  1107    PT Stop Time  1200    PT Time Calculation (min)  53 min    Activity Tolerance  Patient tolerated treatment well       Past Medical History:  Diagnosis Date  . Deviated septum   . Hepatitis    hx hep b 1984  . Hyperlipemia   . Hypothyroidism   . PONV (postoperative nausea and vomiting)   . Sinusitis, chronic   . Tubular adenoma 10/09/2017   No high-grade dysplasia or malignancy     Past Surgical History:  Procedure Laterality Date  . ANTERIOR CRUCIATE LIGAMENT REPAIR Right   . HAND SURGERY Left   . HERPES SIMPLEX VIRUS DFA    . KNEE SURGERY Left   . NASAL SEPTOPLASTY W/ TURBINOPLASTY  08/30/2011   Procedure: NASAL SEPTOPLASTY WITH TURBINATE REDUCTION;  Surgeon: Jerrell Belfast, MD;  Location: [facility];  Service: ENT;  Laterality: N/A;  nasal septoplasty, bil. inferior turbinated reduction, bil. endoscopic concha bullosa and anterior ethmoid and maxillary antrostomy, ethmoidectomy  . NASAL SINUS SURGERY  08/30/2011   Procedure: ENDOSCOPIC SINUS SURGERY;  Surgeon: Jerrell Belfast, MD;  Location: [place];  Service: ENT;  Laterality: N/A;  . TUBAL LIGATION      There were no vitals filed for this visit.   Subjective Assessment - 01/29/19 1114    Subjective  Patient reports onset of Lt wrist pain mid April 2020 with no known injury. She received an injection the end of May with 90% improvement lasting ~ 4 weeks. She has now continued pain in the wrist functional  activities.She is using a wrist/thumb brace all day at work; most of the day when not working and sleeps with brace to help decrease pain.    Pertinent History  Lt knee ACL repair; Rt dequevains 2000 resolved with 3 injections and some changes in acctivities; LBP; tendon injury to Lt thumb ~ 20 yrs ago    Patient Stated Goals  get rid of the wrist pain    Currently in Pain?  Yes    Pain Score  4    can increased to 9-10/10 for short periods of time   Pain Location  Wrist    Pain Orientation  Left    Pain Descriptors / Indicators  Sharp    Pain Type  Acute pain    Pain Radiating Towards  radial forearm    Pain Onset  More than a month ago    Pain Frequency  Constant    Aggravating Factors   activity; twisting wrist; opening fingers; moving tuumb away from fingers; pulling    Pain Relieving Factors  brace; ice; rest         [facility] PT Assessment - 01/29/19 0001      Assessment   Medical Diagnosis  Lt wrist pain     Referring Provider (PT)  Dr Hulan Saas     Onset Date/Surgical Date  10/14/18  Hand Dominance  Right    Next MD Visit  02/17/2019    Prior Therapy  now for wrist       Precautions   Precautions  None      Restrictions   Weight Bearing Restrictions  No      Balance Screen   Has the patient fallen in the past 6 months  Yes    How many times?  1    Has the patient had a decrease in activity level because of a fear of falling?   No    Is the patient reluctant to leave their home because of a fear of falling?   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     Leisure  household chores; yard work; home repairs       Observation/Other Assessments   Observations  pt presents with Lt wrist and thumb braced     Focus on Therapeutic Outcomes (FOTO)   55% limitation       Sensation   Additional Comments  WFL's per pt report        Posture/Postural Control   Posture Comments  head forward shoudlers rounded       AROM   Right/Left Shoulder  --   decreased end ranges Lt > Rt    Right/Left Elbow  --   WFL's bilat    Right/Left Forearm  --   WFL's bilat    Right Wrist Extension  67 Degrees    Right Wrist Flexion  52 Degrees    Right Wrist Radial Deviation  24 Degrees    Right Wrist Ulnar Deviation  34 Degrees    Left Wrist Extension  62 Degrees    Left Wrist Flexion  50 Degrees    Left Wrist Radial Deviation  14 Degrees    Left Wrist Ulnar Deviation  27 Degrees    Right/Left Finger  --   WFL's bilat    Right/Left Thumb  --   limited thumb opposition ~ 1.75 inches to hypothenar      Strength   Right/Left Shoulder  --   WFL's bilat    Right/Left Elbow  --   WFL's bilat    Right/Left Forearm  --   WFL's bilat    Right/Left Wrist  --   Lt wrist not assessed resistively due to pain    Right Wrist Flexion  5/5    Right Wrist Extension  5/5    Right Wrist Radial Deviation  5/5    Right Wrist Ulnar Deviation  5/5    Right Hand Grip (lbs)  57    Right Hand Lateral Pinch  9.5 lbs    Right Hand 3 Point Pinch  4.5 lbs    Left Hand Grip (lbs)  32    Left Hand Lateral Pinch  5.5 lbs    Left Hand 3 Point Pinch  2 lbs      Palpation   Palpation comment  significant tenderness to palpation through the radial forearm into 1st metacarpal - tender through the extensor pollicis longus and brevis                 Objective measurements completed on examination: See above findings.      [facility] PT Treatment/Exercise -  01/29/19 0001      Self-Care   Self-Care  --   education re activity level; splint; exercises      Hand Exercises   Thumb Opposition  3 reps 15 sec hold AAROM thumb across hand to tissue tolerance       Wrist Exercises   Wrist Flexion  AAROM;Left   3 resp 15 sec hold    Wrist Extension  AAROM;Left   3 reps 15 sec hold    Wrist Radial Deviation  AAROM;Left   3 reps 15 sec  hold    Wrist Ulnar Deviation  AAROM;Left   3 reps 15 sec hold             PT Education - 01/29/19 1159    Education Details  HEP TENS ionto    Person(s) Educated  Patient    Methods  Explanation;Demonstration;Tactile cues;Verbal cues;Handout    Comprehension  Verbalized understanding;Returned demonstration;Verbal cues required;Tactile cues required          PT Long Term Goals - 01/29/19 1445      PT LONG TERM GOAL #1   Title  Decrease pain Lt wrist and thumb to 0/10 to 4/10 at worst 03/12/2019    Time  6    Period  Weeks    Status  New      PT LONG TERM GOAL #2   Title  Increase AROM Lt wrist and thumb to equal AROM Rt wrist and thumb 03/12/2019    Time  6    Period  Weeks    Status  New      PT LONG TERM GOAL #3   Title  Increase strength Lt wrist and hand to 4/5 to 4+/5 and Lt grip strength to 40-45 #  03/12/2019    Time  6    Period  Weeks    Status  New      PT LONG TERM GOAL #4   Title  Independent in HEP 03/12/2019    Time  6    Period  Weeks    Status  New      PT LONG TERM GOAL #5   Title  Improve FOTO to </= 43% limitation 03/12/2019    Time  6    Period  Weeks    Status  New             Plan - 01/29/19 1439    Clinical Impression Statement  Patient presents with signs and symptoms consistent with tenodnitis Lt wrist with symptoms present for the past 3-4 months. She is currently in a brace almost 24/7 and continues to have painwith functional use of Lt wrist and hand. She had an injection which helped for 3-4 weeks. Paiin is now constant in nature with variable intensity. She has limited Lt wrist and thumb mobitliy and strength and limited funcitonal activity with Lt UE due to symptoms. Patient will benefit from PT to address problems identified.    Stability/Clinical Decision Making  Stable/Uncomplicated    Clinical Decision Making  Low    Rehab Potential  Good    PT Frequency  2x / week    PT Duration  6 weeks    PT Treatment/Interventions   Patient/family education;ADLs/Self Care Home Management;Cryotherapy;Electrical Stimulation;Iontophoresis 4mg /ml Dexamethasone;Moist Heat;Ultrasound;Manual techniques;Dry needling;Functional mobility training;Therapeutic activities;Therapeutic exercise;Passive range of motion;Taping    PT Next Visit Plan  review HEP; possible trial of ionto and/or taping; manual work Lt forearm and wrist to tolerance; modalities as indicated  PT Home Exercise Plan  [email or web address]    Consulted and Agree with Plan of Care  Patient       Patient will benefit from skilled therapeutic intervention in order to improve the following deficits and impairments:  Pain, Impaired UE functional use, Increased fascial restricitons, Increased muscle spasms, Hypermobility, Decreased strength, Decreased range of motion, Decreased mobility, Decreased activity tolerance  Visit Diagnosis: 1. Pain in left wrist   2. Other symptoms and signs involving the musculoskeletal system   3. Muscle weakness (generalized)        Problem List Patient Active Problem List   Diagnosis Date Noted  . Carpal tunnel syndrome, right 12/14/2018  . Degenerative arthritis of left knee 12/14/2018  . Nonallopathic lesion of sacral region 02/17/2018  . Fecal incontinence 01/21/2018  . Tubular adenoma 10/09/2017  . Generalized hypermobility of joints 09/22/2017  . PTSD (post-traumatic stress disorder) 09/22/2017  . Chronic hepatitis, needs q 6 month liver US 09/19/2017  . Stress fracture, right foot, subsequent encounter for fracture with delayed healing 09/19/2017  . Acid reflux 09/19/2017  . Dysphagia 09/19/2017  . Arthritis 09/19/2017  . IBS (irritable bowel syndrome) 09/19/2017  . Vitamin D deficiency 09/19/2017  . Plantar fasciitis 09/19/2017  . Tear of deltoid ligament of ankle, left, initial encounter 07/08/2017  . Tension type headache, prn Fioricet 06/18/2017  . Lumbar radiculopathy, right 08/19/2016  . Trigger point of right shoulder  region 04/22/2016  . Slipped rib syndrome 04/22/2016  . Nonallopathic lesion of rib cage 04/22/2016  . Nonallopathic lesion of cervical region 04/22/2016  . Nonallopathic lesion of lumbosacral region 04/22/2016  . Nonallopathic lesion of thoracic region 04/22/2016  . Piriformis syndrome of right side 04/22/2016  . Osteopenia 11/01/2015  . Left navicular fracture of foot 10/11/2015  . High risk medication use 09/20/2014  . Tibialis posterior tendinitis 12/14/2013  . De Quervain's tenosynovitis, left 12/14/2013  . Sinusitis, chronic 08/30/2011  . Deviated nasal septum 08/30/2011  . Allergic rhinitis 06/08/2007  . Insomnia, psychophsiologic 06/08/2007    Everardo All PT, MPH  01/29/2019, 2:51 PM  [facility] [place], [place], [address] Phone: [phone]   Fax:  [phone]  Name: MARIANY MACKINTOSH MRN: [id number] Date of Birth: 01/21/1956

## 2019-02-03 ENCOUNTER — Ambulatory Visit: Payer: No Typology Code available for payment source | Admitting: Rehabilitative and Restorative Service Providers"

## 2019-02-03 ENCOUNTER — Encounter: Payer: Self-pay | Admitting: Rehabilitative and Restorative Service Providers"

## 2019-02-03 ENCOUNTER — Other Ambulatory Visit: Payer: Self-pay

## 2019-02-03 DIAGNOSIS — M6281 Muscle weakness (generalized): Secondary | ICD-10-CM | POA: Diagnosis not present

## 2019-02-03 DIAGNOSIS — R29898 Other symptoms and signs involving the musculoskeletal system: Secondary | ICD-10-CM

## 2019-02-03 DIAGNOSIS — M25532 Pain in left wrist: Secondary | ICD-10-CM

## 2019-02-03 NOTE — Patient Instructions (Signed)
Neurovascular: Median Nerve Stretch - Supine    Lie with neck supported, tuck tip and turn head to the right bring left arm out to 90 degrees and slowly straighten elbow fingers toward floor. No PAIN - just a stretch gentle pull. Hold for __60__ seconds. Do _2 reps  Reps Do ___2 times/day

## 2019-02-03 NOTE — Therapy (Signed)
[place], [place], [address] Phone: [phone]   Fax:  [phone]  Physical Therapy Treatment  Patient Details  Name: Sherry [name] MRN: [id number] Date of Birth: 1956-06-19 Referring Provider (PT): Dr Hulan Saas    Encounter Date: 02/03/2019  PT End of Session - 02/03/19 1057    Visit Number  2    Number of Visits  12    Date for PT Re-Evaluation  03/12/19    PT Start Time  1057    PT Stop Time  1142    PT Time Calculation (min)  45 min    Activity Tolerance  Patient tolerated treatment well       Past Medical History:  Diagnosis Date  . Deviated septum   . Hepatitis    hx hep b 1984  . Hyperlipemia   . Hypothyroidism   . PONV (postoperative nausea and vomiting)   . Sinusitis, chronic   . Tubular adenoma 10/09/2017   No high-grade dysplasia or malignancy     Past Surgical History:  Procedure Laterality Date  . ANTERIOR CRUCIATE LIGAMENT REPAIR Right   . HAND SURGERY Left   . HERPES SIMPLEX VIRUS DFA    . KNEE SURGERY Left   . NASAL SEPTOPLASTY W/ TURBINOPLASTY  08/30/2011   Procedure: NASAL SEPTOPLASTY WITH TURBINATE REDUCTION;  Surgeon: Jerrell Belfast, MD;  Location: [facility];  Service: ENT;  Laterality: N/A;  nasal septoplasty, bil. inferior turbinated reduction, bil. endoscopic concha bullosa and anterior ethmoid and maxillary antrostomy, ethmoidectomy  . NASAL SINUS SURGERY  08/30/2011   Procedure: ENDOSCOPIC SINUS SURGERY;  Surgeon: Jerrell Belfast, MD;  Location: [facility];  Service: ENT;  Laterality: N/A;  . TUBAL LIGATION      There were no vitals filed for this visit.  Subjective Assessment - 02/03/19 1057    Subjective  Patient reports that she has gotten the TENS unit for home use but has not tried it yet. Working on her exercises and thinks that the pain may be a little bit better.    Currently in Pain?  Yes    Pain Score  1     Pain Location  Wrist    Pain Orientation  Left                       [facility] Adult PT Treatment/Exercise - 02/03/19 0001      Hand Exercises   Thumb Opposition  3 reps 15 sec hold AAROM thumb across hand to tissue tolerance       Wrist Exercises   Wrist Flexion  AAROM;Left   3 resp 15 sec hold    Wrist Extension  AAROM;Left   3 reps 15 sec hold    Wrist Radial Deviation  AAROM;Left   3 reps 15 sec hold    Wrist Ulnar Deviation  AAROM;Left   3 reps 15 sec hold    Other wrist exercises  neural mobilization Lt UE supine 60 sec x 2 reps - PT assist       Moist Heat Therapy   Number Minutes Moist Heat  15 Minutes    Moist Heat Location  Wrist;Hand   Lt      Electrical Stimulation   Electrical Stimulation Location  Lt thumb and wrist     Electrical Stimulation Action  TENS     Electrical Stimulation Parameters  to tolerance    Electrical Stimulation Goals  Pain;Edema      Manual Therapy   Manual therapy comments  pt supine     Soft tissue mobilization  deep tissue work through Lt forearm hand and thumb     Myofascial Release  Lt forearm     Passive ROM  PROM thumb into opposition              PT Education - 02/03/19 1149    Education Details  HEP TENS instruction    Person(s) Educated  Patient    Methods  Explanation;Demonstration;Tactile cues;Verbal cues;Handout    Comprehension  Verbalized understanding;Returned demonstration;Verbal cues required;Tactile cues required          PT Long Term Goals - 01/29/19 1445      PT LONG TERM GOAL #1   Title  Decrease pain Lt wrist and thumb to 0/10 to 4/10 at worst 03/12/2019    Time  6    Period  Weeks    Status  New      PT LONG TERM GOAL #2   Title  Increase AROM Lt wrist and thumb to equal AROM Rt wrist and thumb 03/12/2019    Time  6    Period  Weeks    Status  New      PT LONG TERM GOAL #3   Title  Increase strength Lt wrist and hand to 4/5 to 4+/5 and Lt grip strength to 40-45 #  03/12/2019     Time  6    Period  Weeks    Status  New      PT LONG TERM GOAL #4   Title  Independent in HEP 03/12/2019    Time  6    Period  Weeks    Status  New      PT LONG TERM GOAL #5   Title  Improve FOTO to </= 43% limitation 03/12/2019    Time  6    Period  Weeks    Status  New            Plan - 02/03/19 1057    Clinical Impression Statement  Continued Lt wrist and thumb pain. Reviewed all exercises. Noted (+) neural tension test Lt > Rt UE. Added neural mobilization; manual work through [place] forearm wrist and hand; gentle PROm and stretching. Patient instructed in application use and care of TENS unit. No significant changes noted in this second visit.    Stability/Clinical Decision Making  Stable/Uncomplicated    Rehab Potential  Good    PT Frequency  2x / week    PT Duration  6 weeks    PT Treatment/Interventions  Patient/family education;ADLs/Self Care Home Management;Cryotherapy;Electrical Stimulation;Iontophoresis 4mg /ml Dexamethasone;Moist Heat;Ultrasound;Manual techniques;Dry needling;Functional mobility training;Therapeutic activities;Therapeutic exercise;Passive range of motion;Taping    PT Next Visit Plan  review HEP; possible trial of ionto and/or taping; assess response to manual work Lt forearm and wrist; modalities as indicated    PT Home Exercise Plan  [id number]    Consulted and Agree with Plan of Care  Patient       Patient will benefit from skilled therapeutic intervention in order to improve the following deficits and impairments:  Pain, Impaired UE functional use, Increased fascial restricitons, Increased muscle spasms, Hypermobility, Decreased strength, Decreased range of motion, Decreased mobility, Decreased activity tolerance  Visit Diagnosis: 1. Pain in left wrist   2. Other symptoms and signs involving the musculoskeletal system   3. Muscle weakness (generalized)        Problem List Patient  Active Problem List   Diagnosis Date Noted  . Carpal tunnel  syndrome, right 12/14/2018  . Degenerative arthritis of left knee 12/14/2018  . Nonallopathic lesion of sacral region 02/17/2018  . Fecal incontinence 01/21/2018  . Tubular adenoma 10/09/2017  . Generalized hypermobility of joints 09/22/2017  . PTSD (post-traumatic stress disorder) 09/22/2017  . Chronic hepatitis, needs q 6 month liver US 09/19/2017  . Stress fracture, right foot, subsequent encounter for fracture with delayed healing 09/19/2017  . Acid reflux 09/19/2017  . Dysphagia 09/19/2017  . Arthritis 09/19/2017  . IBS (irritable bowel syndrome) 09/19/2017  . Vitamin D deficiency 09/19/2017  . Plantar fasciitis 09/19/2017  . Tear of deltoid ligament of ankle, left, initial encounter 07/08/2017  . Tension type headache, prn Fioricet 06/18/2017  . Lumbar radiculopathy, right 08/19/2016  . Trigger point of right shoulder region 04/22/2016  . Slipped rib syndrome 04/22/2016  . Nonallopathic lesion of rib cage 04/22/2016  . Nonallopathic lesion of cervical region 04/22/2016  . Nonallopathic lesion of lumbosacral region 04/22/2016  . Nonallopathic lesion of thoracic region 04/22/2016  . Piriformis syndrome of right side 04/22/2016  . Osteopenia 11/01/2015  . Left navicular fracture of foot 10/11/2015  . High risk medication use 09/20/2014  . Tibialis posterior tendinitis 12/14/2013  . De Quervain's tenosynovitis, left 12/14/2013  . Sinusitis, chronic 08/30/2011  . Deviated nasal septum 08/30/2011  . Allergic rhinitis 06/08/2007  . Insomnia, psychophsiologic 06/08/2007    Everardo All PT, MPH  02/03/2019, 2:18 PM  [facility] [place], [place], [address] Phone: [phone]   Fax:  [phone]  Name: MARLY SCHULD MRN: [id number] Date of Birth: January 10, 1956

## 2019-02-10 ENCOUNTER — Other Ambulatory Visit: Payer: Self-pay

## 2019-02-10 ENCOUNTER — Ambulatory Visit: Payer: No Typology Code available for payment source | Admitting: Physical Therapy

## 2019-02-10 DIAGNOSIS — R29898 Other symptoms and signs involving the musculoskeletal system: Secondary | ICD-10-CM

## 2019-02-10 DIAGNOSIS — M6281 Muscle weakness (generalized): Secondary | ICD-10-CM | POA: Diagnosis not present

## 2019-02-10 DIAGNOSIS — M25532 Pain in left wrist: Secondary | ICD-10-CM

## 2019-02-10 NOTE — Therapy (Signed)
[place], [place], [address] Phone: [phone]   Fax:  [phone]  Physical Therapy Treatment  Patient Details  Name: Sherry [name] MRN: [id number] Date of Birth: Sep 13, 1955 Referring Provider (PT): Dr Hulan Saas    Encounter Date: 02/10/2019  PT End of Session - 02/10/19 1132    Visit Number  3    Number of Visits  12    Date for PT Re-Evaluation  03/12/19    PT Start Time  1103    PT Stop Time  1145    PT Time Calculation (min)  42 min    Activity Tolerance  Patient tolerated treatment well;No increased pain    Behavior During Therapy  WFL for tasks assessed/performed       Past Medical History:  Diagnosis Date  . Deviated septum   . Hepatitis    hx hep b 1984  . Hyperlipemia   . Hypothyroidism   . PONV (postoperative nausea and vomiting)   . Sinusitis, chronic   . Tubular adenoma 10/09/2017   No high-grade dysplasia or malignancy     Past Surgical History:  Procedure Laterality Date  . ANTERIOR CRUCIATE LIGAMENT REPAIR Right   . HAND SURGERY Left   . HERPES SIMPLEX VIRUS DFA    . KNEE SURGERY Left   . NASAL SEPTOPLASTY W/ TURBINOPLASTY  08/30/2011   Procedure: NASAL SEPTOPLASTY WITH TURBINATE REDUCTION;  Surgeon: Jerrell Belfast, MD;  Location: [facility];  Service: ENT;  Laterality: N/A;  nasal septoplasty, bil. inferior turbinated reduction, bil. endoscopic concha bullosa and anterior ethmoid and maxillary antrostomy, ethmoidectomy  . NASAL SINUS SURGERY  08/30/2011   Procedure: ENDOSCOPIC SINUS SURGERY;  Surgeon: Jerrell Belfast, MD;  Location: [facility];  Service: ENT;  Laterality: N/A;  . TUBAL LIGATION      There were no vitals filed for this visit.  Subjective Assessment - 02/10/19 1112    Subjective  Pt reports her Lt arm/thumb had hurt since last visit; today's the first day it's feeling back to normal.  She has been doing the  exercises, and using TENS/heat.  She is ready to get another injection from Dr. Tamala Julian.    Currently in Pain?  No/denies    Pain Score  0-No pain         [facility] PT Assessment - 02/10/19 0001      Assessment   Medical Diagnosis  Lt wrist pain     Referring Provider (PT)  Dr Hulan Saas     Onset Date/Surgical Date  10/14/18    Hand Dominance  Right    Next MD Visit  02/17/2019    Prior Therapy  now for wrist       AROM   Right Wrist Radial Deviation  35 Degrees    Left Wrist Radial Deviation  21 Degrees   with pain     Strength   Right Hand Grip (lbs)  59    Right Hand Lateral Pinch  8 lbs    Right Hand 3 Point Pinch  7 lbs    Left Hand Grip (lbs)  43    Left Hand Lateral Pinch  4.5 lbs    Left Hand 3 Point Pinch  4 lbs       [facility] Adult PT Treatment/Exercise - 02/10/19 0001      Wrist Exercises   Wrist Flexion  AAROM;Left;10 reps   3 sec hold   Wrist Extension  AAROM;Left;10 reps;Seated   3 sec hold   Wrist Radial Deviation  AROM;Left;5 reps;Seated    Wrist Ulnar Deviation  AROM;Left;5 reps    Other wrist exercises  Lt forearm pronation/ supination with 1# weight x 10 each direction       Modalities   Modalities  Ultrasound      Ultrasound   Ultrasound Location  Lt prox thumb tendons / EPL, EPB, APL    Ultrasound Parameters  50%, 1.2w/cm2, 8 min     Ultrasound Goals  Edema;Pain      Manual Therapy   Manual therapy comments  pt seated; I strip of reg Rock tape applied with 15% stretch of thumb tendons to mid radius; cross strip of Rock tape applied over extensor pollicis longus     Soft tissue mobilization  IASTM and STM to Lt forearm and thenar emminance    Passive ROM  PROM thumb into opposition              PT Education - 02/10/19 1130    Education Details  HEP; kinesiotape info; instructions on ice [employer]) Educated  Patient    Methods  Explanation;Handout;Verbal cues;Demonstration    Comprehension  Verbalized understanding;Returned  demonstration          PT Long Term Goals - 01/29/19 1445      PT LONG TERM GOAL #1   Title  Decrease pain Lt wrist and thumb to 0/10 to 4/10 at worst 03/12/2019    Time  6    Period  Weeks    Status  New      PT LONG TERM GOAL #2   Title  Increase AROM Lt wrist and thumb to equal AROM Rt wrist and thumb 03/12/2019    Time  6    Period  Weeks    Status  New      PT LONG TERM GOAL #3   Title  Increase strength Lt wrist and hand to 4/5 to 4+/5 and Lt grip strength to 40-45 #  03/12/2019    Time  6    Period  Weeks    Status  New      PT LONG TERM GOAL #4   Title  Independent in HEP 03/12/2019    Time  6    Period  Weeks    Status  New      PT LONG TERM GOAL #5   Title  Improve FOTO to </= 43% limitation 03/12/2019    Time  6    Period  Weeks    Status  New            Plan - 02/10/19 1114    Clinical Impression Statement  Pt demonstrated improvement in Lt hand grip strength.  Pt very guarded with exercises and manual therapy to Lt wrist.  She tolerated new pronation/supination exercise well.  Pt declined to trial ionto for she was afraid if she did, she wouldn't be allowed to have injection last week.  Instead, trial of Rocktape was applied to anatomical snuffbox to provide support, increase proprioception, and decompress tissue.  Goals are ongoing at this time.    Rehab Potential  Good    PT Frequency  2x / week    PT Duration  6 weeks    PT Treatment/Interventions  Patient/family education;ADLs/Self Care Home Management;Cryotherapy;Electrical Stimulation;Iontophoresis 4mg /ml Dexamethasone;Moist Heat;Ultrasound;Manual techniques;Dry needling;Functional mobility training;Therapeutic activities;Therapeutic exercise;Passive range of motion;Taping    PT Next Visit Plan  assess response  to [place] and tape; continue progressive ROM/ strengthening exercises.    PT Home Exercise Plan  [id number]    Consulted and Agree with Plan of Care  Patient       Patient will benefit from  skilled therapeutic intervention in order to improve the following deficits and impairments:  Pain, Impaired UE functional use, Increased fascial restricitons, Increased muscle spasms, Hypermobility, Decreased strength, Decreased range of motion, Decreased mobility, Decreased activity tolerance  Visit Diagnosis: 1. Pain in left wrist   2. Other symptoms and signs involving the musculoskeletal system   3. Muscle weakness (generalized)        Problem List Patient Active Problem List   Diagnosis Date Noted  . Carpal tunnel syndrome, right 12/14/2018  . Degenerative arthritis of left knee 12/14/2018  . Nonallopathic lesion of sacral region 02/17/2018  . Fecal incontinence 01/21/2018  . Tubular adenoma 10/09/2017  . Generalized hypermobility of joints 09/22/2017  . PTSD (post-traumatic stress disorder) 09/22/2017  . Chronic hepatitis, needs q 6 month liver US 09/19/2017  . Stress fracture, right foot, subsequent encounter for fracture with delayed healing 09/19/2017  . Acid reflux 09/19/2017  . Dysphagia 09/19/2017  . Arthritis 09/19/2017  . IBS (irritable bowel syndrome) 09/19/2017  . Vitamin D deficiency 09/19/2017  . Plantar fasciitis 09/19/2017  . Tear of deltoid ligament of ankle, left, initial encounter 07/08/2017  . Tension type headache, prn Fioricet 06/18/2017  . Lumbar radiculopathy, right 08/19/2016  . Trigger point of right shoulder region 04/22/2016  . Slipped rib syndrome 04/22/2016  . Nonallopathic lesion of rib cage 04/22/2016  . Nonallopathic lesion of cervical region 04/22/2016  . Nonallopathic lesion of lumbosacral region 04/22/2016  . Nonallopathic lesion of thoracic region 04/22/2016  . Piriformis syndrome of right side 04/22/2016  . Osteopenia 11/01/2015  . Left navicular fracture of foot 10/11/2015  . High risk medication use 09/20/2014  . Tibialis posterior tendinitis 12/14/2013  . De Quervain's tenosynovitis, left 12/14/2013  . Sinusitis, chronic  08/30/2011  . Deviated nasal septum 08/30/2011  . Allergic rhinitis 06/08/2007  . Insomnia, psychophsiologic 06/08/2007   Kerin Perna, PTA 02/10/19 11:34 AM  [place], [place], [address] Phone: [phone]   Fax:  [phone]  Name: Sherry [name] MRN: [id number] Date of Birth: 1956-05-11

## 2019-02-10 NOTE — Patient Instructions (Signed)
Iontophoresis - in future session.   Ice massage 3 min to "anatomical snuffbox",  As needed.   Forearm: Pronation / Supination    Keep left elbow bent to 90, wrist straight. Hold neck of bottle. Do not let elbow straighten or move from side. Pronation: Slowly turn palm down. Hold _5-10___ seconds. Supination: Slowly turn palm up. Hold _5-10___ seconds. Repeat __10__ times. Do __1__ sessions per day. CAUTION: Movement should be gentle, steady and slow.  Kinesiology tape What is kinesiology tape?  There are many brands of kinesiology tape.  KTape, Rock [employer], [employer], Dynamic tape, to name a few. It is an elasticized tape designed to support the body's natural healing process. This tape provides stability and support to muscles and joints without restricting motion. It can also help decrease swelling in the area of application. How does it work? The tape microscopically lifts and decompresses the skin to allow for drainage of lymph (swelling) to flow away from area, reducing inflammation.  The tape has the ability to help re-educate the neuromuscular system by targeting specific receptors in the skin.  The presence of the tape increases the body's awareness of posture and body mechanics.  Do not use with: . Open wounds . Skin lesions . Adhesive allergies Safe removal of the tape: In some rare cases, mild/moderate skin irritation can occur.  This can include redness, itchiness, or hives. If this occurs, immediately remove tape and consult your primary care physician if symptoms are severe or do not resolve within 2 days.  To remove tape safely, hold nearby skin with one hand and gentle roll tape down with other hand.  You can apply oil or conditioner to tape while in shower prior to removal to loosen adhesive.  DO NOT swiftly rip tape off like a band-aid, as this could cause skin tears and additional skin irritation.

## 2019-02-15 NOTE — Progress Notes (Signed)
[name] [place], [place] [address] Phone: [phone] Subjective:   I Sherry [name] am serving as a Education administrator for Dr. Hulan Saas.  I'm seeing this patient by the request  of:   CC: Left wrist follow-up  XBW:[id number]   01/12/2019 Continues to have pain.  We discussed the possibility of injections with patient declined at the moment.  Patient will continue with the custom brace.  Wants to hold on any other surgical interventions.  Do not feel further imaging is necessary.  Continues to have pain.  Still too soon to repeat the injections.  We discussed icing regimen and home exercises.  Patient will be sent to formal physical therapy that I think will be beneficial.  We discussed which activities to do which wants to avoid.  Patient can continue the bracing.  Discussed the possibility of PRP.  Start nitroglycerin patches.  Follow-up again 4 to 6 weeks  02/16/2019  Sherry [name] ULAGTXMIWO is a 63 y.o. female coming in with complaint of wrist pain. States her wrist feels about the same.  Patient was found to have more of a de Quervain's tenosynovitis Has had difficulty with healing previously.  Patient has been doing formal physical therapy, bracing as well as nitroglycerin patch with minimal improvement.  Patient states that he still gives her pain on a daily basis.     Past Medical History:  Diagnosis Date  . Deviated septum   . Hepatitis    hx hep b 1984  . Hyperlipemia   . Hypothyroidism   . PONV (postoperative nausea and vomiting)   . Sinusitis, chronic   . Tubular adenoma 10/09/2017   No high-grade dysplasia or malignancy    Past Surgical History:  Procedure Laterality Date  . ANTERIOR CRUCIATE LIGAMENT REPAIR Right   . HAND SURGERY Left   . HERPES SIMPLEX VIRUS DFA    . KNEE SURGERY Left   . NASAL SEPTOPLASTY W/ TURBINOPLASTY  08/30/2011   Procedure: NASAL SEPTOPLASTY WITH TURBINATE REDUCTION;  Surgeon: Jerrell Belfast, MD;   Location: [place];  Service: ENT;  Laterality: N/A;  nasal septoplasty, bil. inferior turbinated reduction, bil. endoscopic concha bullosa and anterior ethmoid and maxillary antrostomy, ethmoidectomy  . NASAL SINUS SURGERY  08/30/2011   Procedure: ENDOSCOPIC SINUS SURGERY;  Surgeon: Jerrell Belfast, MD;  Location: [place];  Service: ENT;  Laterality: N/A;  . TUBAL LIGATION     Social History   Socioeconomic History  . Marital status: Divorced    Spouse name: Not on file  . Number of children: Not on file  . Years of education: Not on file  . Highest education level: Not on file  Occupational History    Employer: [employer]  . Financial resource strain: Not on file  . Food insecurity    Worry: Not on file    Inability: Not on file  . Transportation needs    Medical: Not on file    Non-medical: Not on file  Tobacco Use  . Smoking status: Never Smoker  . Smokeless tobacco: Never Used  Substance and Sexual Activity  . Alcohol use: No  . Drug use: No  . Sexual activity: Not on file  Lifestyle  . Physical activity    Days per week: Not on file    Minutes per session: Not on file  . Stress: Not on file  Relationships  . Social Herbalist on  phone: Not on file    Gets together: Not on file    Attends religious service: Not on file    Active member of club or organization: Not on file    Attends meetings of clubs or organizations: Not on file    Relationship status: Not on file  Other Topics Concern  . Not on file  Social History Narrative  . Not on file   Allergies  Allergen Reactions  . Other Other (See Comments)    Antiretroviral, caused jaundice   Family History  Problem Relation Age of Onset  . Leukemia Mother   . Lung cancer Father      Current Outpatient Medications (Cardiovascular):  .  nitroGLYCERIN (NITRO-DUR) 0.1 mg/hr patch, 1/4 patch daily  Current Outpatient Medications (Respiratory):   .  azelastine (ASTELIN) 0.1 % nasal spray, Place 1 spray into both nostrils 2 (two) times daily. Use in each nostril as directed .  fluticasone (FLONASE) 50 MCG/ACT nasal spray, Place 2 sprays into both nostrils daily.   Current Outpatient Medications (Hematological):  .  cyanocobalamin (,VITAMIN B-12,) 1000 MCG/ML injection, Inject 1 mL IM once weekly for 4 weeks and then once monthly.  Current Outpatient Medications (Other):  Marland Kitchen  AMBULATORY NON FORMULARY MEDICATION, Medication Name: orthopaedic shoes.  For chronic back pain .  AMBULATORY NON FORMULARY MEDICATION, Even up shoe balancer. .  gabapentin (NEURONTIN) 100 MG capsule, Take 2 capsules (200 mg total) by mouth at bedtime. Marland Kitchen  nystatin-triamcinolone ointment (MYCOLOG), Apply 1 application topically 2 (two) times daily. .  SYRINGE-NEEDLE, DISP, 3 ML 25G X 1" 3 ML MISC, Use to inject B12 IM .  terbinafine (LAMISIL) 250 MG tablet, Take 1 tablet (250 mg total) by mouth daily. .  Turmeric 500 MG CAPS, Take 1 capsule by mouth daily. Marland Kitchen  zolpidem (AMBIEN) 10 MG tablet, Take 10 mg by mouth as needed.    Past medical history, social, surgical and family history all reviewed in electronic medical record.  No pertanent information unless stated regarding to the chief complaint.   Review of Systems:  No headache, visual changes, nausea, vomiting, diarrhea, constipation, dizziness, abdominal pain, skin rash, fevers, chills, night sweats, weight loss, swollen lymph nodes, body aches, joint swelling,  chest pain, shortness of breath, mood changes.  Positive muscle aches  Objective  Blood pressure 110/70, pulse 85, height 5\' 7"  (1.702 m), SpO2 97 %.    General: No apparent distress alert and oriented x3 mood and affect normal, dressed appropriately.  HEENT: Pupils equal, extraocular movements intact  Respiratory: Patient's speak in full sentences and does not appear short of breath  Cardiovascular: No lower extremity edema, non tender, no  erythema  Skin: Warm dry intact with no signs of infection or rash on extremities or on axial skeleton.  Abdomen: Soft nontender  Neuro: Cranial nerves II through XII are intact, neurovascularly intact in all extremities with 2+ DTRs and 2+ pulses.  Lymph: No lymphadenopathy of posterior or anterior cervical chain or axillae bilaterally.  Gait antalgic MSK:  tender with mild limited range of motion and good stability and symmetric strength and tone of shoulders, elbows, , hip, and ankles bilaterally.  Significant arthritis of the left knee noted. Description of the patient still has a positive Finkelstein's test.  And has no pain over the anatomical snuffbox.  Patient is having good grip strength.  Neurovascular intact distally.  Negative Tinel sign.  Procedure: Real-time Ultrasound Guided Injection of left Abductor pollicis longs tendon sheath Device: GE  Logiq Q7  Ultrasound guided injection is preferred based studies that show increased duration, increased effect, greater accuracy, decreased procedural pain, increased response rate with ultrasound guided versus blind injection.  Verbal informed consent obtained.  Time-out conducted.  Noted no overlying erythema, induration, or other signs of local infection.  Skin prepped in a sterile fashion.  Local anesthesia: Topical Ethyl chloride.  With sterile technique and under real time ultrasound guidance:  tendon visualized.  23g 5/8 inch needle inserted distal to proximal approach into tendon sheath. Pictures taken  for needle placement. Patient did have injection of 0.5 cc of of 0.5% Marcaine, and 0.5 cc of Kenalog 40 mg/dL. Completed without difficulty  Pain immediately resolved suggesting accurate placement of the medication.  Advised to call if fevers/chills, erythema, induration, drainage, or persistent bleeding.  Images permanently stored and available for review in the ultrasound unit.  Impression: Technically successful ultrasound guided  injection.    Impression and Recommendations:     This case required medical decision making of moderate complexity. The above documentation has been reviewed and is accurate and complete Lyndal Pulley, DO       Note: This dictation was prepared with Dragon dictation along with smaller phrase technology. Any transcriptional errors that result from this process are unintentional.

## 2019-02-16 ENCOUNTER — Ambulatory Visit (INDEPENDENT_AMBULATORY_CARE_PROVIDER_SITE_OTHER): Payer: No Typology Code available for payment source | Admitting: Family Medicine

## 2019-02-16 ENCOUNTER — Other Ambulatory Visit: Payer: Self-pay

## 2019-02-16 ENCOUNTER — Ambulatory Visit: Payer: Self-pay

## 2019-02-16 ENCOUNTER — Encounter: Payer: Self-pay | Admitting: Family Medicine

## 2019-02-16 VITALS — BP 110/70 | HR 85 | Ht 67.0 in

## 2019-02-16 DIAGNOSIS — M25539 Pain in unspecified wrist: Secondary | ICD-10-CM

## 2019-02-16 DIAGNOSIS — M654 Radial styloid tenosynovitis [de Quervain]: Secondary | ICD-10-CM

## 2019-02-16 NOTE — Patient Instructions (Signed)
Brace daily then nightly for 2 weeks PT if you want See me again in 4-5 weeks

## 2019-02-16 NOTE — Assessment & Plan Note (Signed)
Repeat injection given today.  Discussed icing regimen and home exercises, discussed with the patient doing which wants to avoid.  Patient is to increase activity as tolerated.  Patient will continue the brace nightly for another 2 weeks.  If continuing to have difficulty I would encourage patient to consider an MRI for further evaluation and possible surgical intervention.  Spent  25 minutes with patient face-to-face and had greater than 50% of counseling including as described above in assessment and plan.

## 2019-02-17 ENCOUNTER — Encounter: Payer: No Typology Code available for payment source | Admitting: Physical Therapy

## 2019-02-24 ENCOUNTER — Other Ambulatory Visit: Payer: Self-pay

## 2019-02-24 ENCOUNTER — Encounter: Payer: Self-pay | Admitting: Physical Therapy

## 2019-02-24 ENCOUNTER — Ambulatory Visit: Payer: No Typology Code available for payment source | Admitting: Physical Therapy

## 2019-02-24 DIAGNOSIS — M25532 Pain in left wrist: Secondary | ICD-10-CM

## 2019-02-24 DIAGNOSIS — R29898 Other symptoms and signs involving the musculoskeletal system: Secondary | ICD-10-CM | POA: Diagnosis not present

## 2019-02-24 DIAGNOSIS — M6281 Muscle weakness (generalized): Secondary | ICD-10-CM | POA: Diagnosis not present

## 2019-02-24 NOTE — Therapy (Addendum)
[place], [place], [address] Phone: [phone]   Fax:  [phone]  Physical Therapy Treatment  Patient Details  Name: Sherry [name] MRN: [id number] Date of Birth: 09-20-1955 Referring Provider (PT): Dr Hulan Saas    Encounter Date: 02/24/2019  PT End of Session - 02/24/19 1023    Visit Number  4    Number of Visits  12    Date for PT Re-Evaluation  03/12/19    PT Start Time  1020    PT Stop Time  1059    PT Time Calculation (min)  39 min       Past Medical History:  Diagnosis Date  . Deviated septum   . Hepatitis    hx hep b 1984  . Hyperlipemia   . Hypothyroidism   . PONV (postoperative nausea and vomiting)   . Sinusitis, chronic   . Tubular adenoma 10/09/2017   No high-grade dysplasia or malignancy     Past Surgical History:  Procedure Laterality Date  . ANTERIOR CRUCIATE LIGAMENT REPAIR Right   . HAND SURGERY Left   . HERPES SIMPLEX VIRUS DFA    . KNEE SURGERY Left   . NASAL SEPTOPLASTY W/ TURBINOPLASTY  08/30/2011   Procedure: NASAL SEPTOPLASTY WITH TURBINATE REDUCTION;  Surgeon: Jerrell Belfast, MD;  Location: [place];  Service: ENT;  Laterality: N/A;  nasal septoplasty, bil. inferior turbinated reduction, bil. endoscopic concha bullosa and anterior ethmoid and maxillary antrostomy, ethmoidectomy  . NASAL SINUS SURGERY  08/30/2011   Procedure: ENDOSCOPIC SINUS SURGERY;  Surgeon: Jerrell Belfast, MD;  Location: [place];  Service: ENT;  Laterality: N/A;  . TUBAL LIGATION      There were no vitals filed for this visit.  Subjective Assessment - 02/24/19 1024    Subjective  Pt reports she had injection last week.  She now only has occasional pain in her Lt wrist.  She noticed she can perform opposition to her finger tips without difficulty now.  She did not like the tape, "it hurt".  She left it on for 24 hrs to see if it would help, but she  didn't think it had any affect.    Currently in Pain?  No/denies   up to 2/10   Pain Location  Wrist    Pain Orientation  Left         [facility] PT Assessment - 02/24/19 0001      Assessment   Medical Diagnosis  Lt wrist pain     Referring Provider (PT)  Dr Hulan Saas     Onset Date/Surgical Date  10/14/18    Hand Dominance  Right    Prior Therapy  now for wrist       AROM   Left Wrist Extension  65 Degrees    Left Wrist Radial Deviation  25 Degrees    Left Wrist Ulnar Deviation  27 Degrees      Strength   Left Wrist Flexion  5/5    Left Wrist Extension  4+/5    Left Wrist Radial Deviation  4+/5    Left Wrist Ulnar Deviation  4+/5    Right Hand Grip (lbs)  58    Left Hand Grip (lbs)  42       [facility] Adult PT Treatment/Exercise - 02/24/19 0001      Wrist Exercises   Wrist Flexion  Strengthening;Left;10 reps    Bar Weights/Barbell (Wrist Flexion)  1 lb  Wrist Extension  Strengthening;Left;10 reps    Bar Weights/Barbell (Wrist Extension)  1 lb    Wrist Radial Deviation  AROM;Left;5 reps    Wrist Ulnar Deviation  AROM;Left;5 reps    Other wrist exercises  Lt forearm pronation/ supination with 1# weight x 10 each direction     Other wrist exercises  Lt wrist ext/ flex stretch x 20 sec, ulnar deviation stretch x 20 sec x 2 reps;  wrist circles x 5;  soft hand master - grip/ finger ext x 12 reps (gentle) , finger opposition x 10        Modalities   Modalities  Ultrasound      Ultrasound   Ultrasound Location  Lt thenar emminance, Lt lateral wrist, and thumb     Ultrasound Parameters  50%, 1.1 w/cm2, 8 min     Ultrasound Goals  Edema;Pain      Manual Therapy   Soft tissue mobilization  STM to Lt forearm and thenar emminance         Dr Charlann Boxer note -- OK for trial of ionto for [facility].     PT Education - 02/24/19 1311    Education Details  HEP - added grip and finger ext (issued rubber band).  Pt declined hand out of exercises.    Person(s) Educated  Patient     Methods  Explanation;Demonstration;Verbal cues    Comprehension  Verbalized understanding;Returned demonstration          PT Long Term Goals - 02/24/19 1307      PT LONG TERM GOAL #1   Title  Decrease pain Lt wrist and thumb to 0/10 to 4/10 at worst 03/12/2019    Time  6    Period  Weeks    Status  On-going      PT LONG TERM GOAL #2   Title  Increase AROM Lt wrist and thumb to equal AROM Rt wrist and thumb 03/12/2019    Time  6    Period  Weeks    Status  On-going      PT LONG TERM GOAL #3   Title  Increase strength Lt wrist and hand to 4/5 to 4+/5 and Lt grip strength to 40-45 #  03/12/2019    Time  6    Period  Weeks    Status  Partially Met      PT LONG TERM GOAL #4   Title  Independent in HEP 03/12/2019    Time  6    Period  Weeks    Status  On-going      PT LONG TERM GOAL #5   Title  Improve FOTO to </= 43% limitation 03/12/2019    Time  6    Period  Weeks    Status  On-going            Plan - 02/24/19 1258    Clinical Impression Statement  Grip strength similar to last assessment.  Lt wrist ROM has improved.  Pt's overall pain rating has improved, and she is reporting greater ease with functional activities.  Pt reported some increased soreness in hand with pronation/supination exercise; eased with STM at end of session. Pt has partially met LTG #3 and is making gradual gains towards goals.    Rehab Potential  Good    PT Frequency  2x / week    PT Duration  6 weeks    PT Treatment/Interventions  Patient/family education;ADLs/Self Care Home Management;Cryotherapy;Electrical Stimulation;Iontophoresis 15m/ml Dexamethasone;Moist Heat;Ultrasound;Manual techniques;Dry  needling;Functional mobility training;Therapeutic activities;Therapeutic exercise;Passive range of motion;Taping    PT Next Visit Plan  continue progressive ROM/ strengthening exercises.    PT Home Exercise Plan  [id number]    Consulted and Agree with Plan of Care  Patient       Patient will  benefit from skilled therapeutic intervention in order to improve the following deficits and impairments:  Pain, Impaired UE functional use, Increased fascial restricitons, Increased muscle spasms, Hypermobility, Decreased strength, Decreased range of motion, Decreased mobility, Decreased activity tolerance  Visit Diagnosis: Pain in left wrist  Other symptoms and signs involving the musculoskeletal system  Muscle weakness (generalized)     Problem List Patient Active Problem List   Diagnosis Date Noted  . Carpal tunnel syndrome, right 12/14/2018  . Degenerative arthritis of left knee 12/14/2018  . Nonallopathic lesion of sacral region 02/17/2018  . Fecal incontinence 01/21/2018  . Tubular adenoma 10/09/2017  . Generalized hypermobility of joints 09/22/2017  . PTSD (post-traumatic stress disorder) 09/22/2017  . Chronic hepatitis, needs q 6 month liver US 09/19/2017  . Stress fracture, right foot, subsequent encounter for fracture with delayed healing 09/19/2017  . Acid reflux 09/19/2017  . Dysphagia 09/19/2017  . Arthritis 09/19/2017  . IBS (irritable bowel syndrome) 09/19/2017  . Vitamin D deficiency 09/19/2017  . Plantar fasciitis 09/19/2017  . Tear of deltoid ligament of ankle, left, initial encounter 07/08/2017  . Tension type headache, prn Fioricet 06/18/2017  . Lumbar radiculopathy, right 08/19/2016  . Trigger point of right shoulder region 04/22/2016  . Slipped rib syndrome 04/22/2016  . Nonallopathic lesion of rib cage 04/22/2016  . Nonallopathic lesion of cervical region 04/22/2016  . Nonallopathic lesion of lumbosacral region 04/22/2016  . Nonallopathic lesion of thoracic region 04/22/2016  . Piriformis syndrome of right side 04/22/2016  . Osteopenia 11/01/2015  . Left navicular fracture of foot 10/11/2015  . High risk medication use 09/20/2014  . Tibialis posterior tendinitis 12/14/2013  . De Quervain's tenosynovitis, left 12/14/2013  . Sinusitis, chronic  08/30/2011  . Deviated nasal septum 08/30/2011  . Allergic rhinitis 06/08/2007  . Insomnia, psychophsiologic 06/08/2007   Kerin Perna, PTA 02/24/19 1:12 PM  [facility] [place], [place], [address] Phone: [phone]   Fax:  [phone]  Name: WILMETTA SPEISER MRN: [id number] Date of Birth: 05-26-1956

## 2019-03-02 ENCOUNTER — Ambulatory Visit: Payer: No Typology Code available for payment source | Admitting: Physical Therapy

## 2019-03-02 ENCOUNTER — Other Ambulatory Visit: Payer: Self-pay

## 2019-03-02 ENCOUNTER — Encounter: Payer: Self-pay | Admitting: Physical Therapy

## 2019-03-02 DIAGNOSIS — M25532 Pain in left wrist: Secondary | ICD-10-CM

## 2019-03-02 DIAGNOSIS — M6281 Muscle weakness (generalized): Secondary | ICD-10-CM | POA: Diagnosis not present

## 2019-03-02 DIAGNOSIS — R29898 Other symptoms and signs involving the musculoskeletal system: Secondary | ICD-10-CM

## 2019-03-02 NOTE — Therapy (Signed)
[place], [place], [address] Phone: [phone]   Fax:  [phone]  Physical Therapy Treatment  Patient Details  Name: Sherry [name] MRN: [id number] Date of Birth: February 26, 1956 Referring Provider (PT): Dr Hulan Saas    Encounter Date: 03/02/2019  PT End of Session - 03/02/19 1104    Visit Number  5    Number of Visits  12    Date for PT Re-Evaluation  03/12/19    PT Start Time  1105    PT Stop Time  1139    PT Time Calculation (min)  34 min    Activity Tolerance  Patient tolerated treatment well;No increased pain    Behavior During Therapy  WFL for tasks assessed/performed       Past Medical History:  Diagnosis Date  . Deviated septum   . Hepatitis    hx hep b 1984  . Hyperlipemia   . Hypothyroidism   . PONV (postoperative nausea and vomiting)   . Sinusitis, chronic   . Tubular adenoma 10/09/2017   No high-grade dysplasia or malignancy     Past Surgical History:  Procedure Laterality Date  . ANTERIOR CRUCIATE LIGAMENT REPAIR Right   . HAND SURGERY Left   . HERPES SIMPLEX VIRUS DFA    . KNEE SURGERY Left   . NASAL SEPTOPLASTY W/ TURBINOPLASTY  08/30/2011   Procedure: NASAL SEPTOPLASTY WITH TURBINATE REDUCTION;  Surgeon: Jerrell Belfast, MD;  Location: [place] [facility];  Service: ENT;  Laterality: N/A;  nasal septoplasty, bil. inferior turbinated reduction, bil. endoscopic concha bullosa and anterior ethmoid and maxillary antrostomy, ethmoidectomy  . NASAL SINUS SURGERY  08/30/2011   Procedure: ENDOSCOPIC SINUS SURGERY;  Surgeon: Jerrell Belfast, MD;  Location: [place];  Service: ENT;  Laterality: N/A;  . TUBAL LIGATION      There were no vitals filed for this visit.  Subjective Assessment - 03/02/19 1105    Subjective  During work shifts, she is using thumb splint.  Overall she is seeing improvement.  Exercises are going well. Rubberband exercise has  been helping.    Currently in Pain?  Yes    Pain Score  2     Pain Location  Wrist    Pain Orientation  Left    Pain Descriptors / Indicators  Nagging    Aggravating Factors   grabbing large objects    Pain Relieving Factors  rest, splint         [facility] PT Assessment - 03/02/19 0001      AROM   Left Wrist Radial Deviation  33 Degrees    Left Wrist Ulnar Deviation  45 Degrees      Strength   Right Hand Grip (lbs)  62    Right Hand Lateral Pinch  9 lbs    Right Hand 3 Point Pinch  7.5 lbs    Left Hand Grip (lbs)  55    Left Hand Lateral Pinch  8.5 lbs    Left Hand 3 Point Pinch  4.5 lbs       [facility] Adult PT Treatment/Exercise - 03/02/19 0001      Wrist Exercises   Wrist Flexion  Strengthening;Left;10 reps    Bar Weights/Barbell (Wrist Flexion)  2 lbs    Wrist Extension  Strengthening;Left;10 reps    Bar Weights/Barbell (Wrist Extension)  2 lbs    Wrist Radial Deviation  Left;10 reps    Bar Weights/Barbell (Radial Deviation)  1  lb    Wrist Ulnar Deviation  Left;10 reps;Seated    Bar Weights/Barbell (Ulnar Deviation)  1 lb    Other wrist exercises  Lt forearm pronation/supination with key grip on velcro board x 3 reps    Other wrist exercises  Lt wrist ext/ flex stretch x 20 sec, ulnar deviation stretch x 20 sec x 2 reps;  wrist circles x 5;  soft hand master - grip/ finger ext x 12 reps (gentle)       Manual Therapy   Soft tissue mobilization  STM to Lt forearm and thenar emminance         PT Long Term Goals - 02/24/19 1307      PT LONG TERM GOAL #1   Title  Decrease pain Lt wrist and thumb to 0/10 to 4/10 at worst 03/12/2019    Time  6    Period  Weeks    Status  On-going      PT LONG TERM GOAL #2   Title  Increase AROM Lt wrist and thumb to equal AROM Rt wrist and thumb 03/12/2019    Time  6    Period  Weeks    Status  On-going      PT LONG TERM GOAL #3   Title  Increase strength Lt wrist and hand to 4/5 to 4+/5 and Lt grip strength to 40-45 #  03/12/2019     Time  6    Period  Weeks    Status  Partially Met      PT LONG TERM GOAL #4   Title  Independent in HEP 03/12/2019    Time  6    Period  Weeks    Status  On-going      PT LONG TERM GOAL #5   Title  Improve FOTO to </= 43% limitation 03/12/2019    Time  6    Period  Weeks    Status  On-going            Plan - 03/02/19 1140    Clinical Impression Statement  Significant improvement in grip strength in Lt hand and wrist ROM.  Pt tolerated increased resistance with exercises without increase in pain. Pt less guarded and tender with STM to Lt thumb and forearm.  Pt making good gains towards goals.    Rehab Potential  Good    PT Frequency  2x / week    PT Duration  6 weeks    PT Treatment/Interventions  Patient/family education;ADLs/Self Care Home Management;Cryotherapy;Electrical Stimulation;Iontophoresis 30m/ml Dexamethasone;Moist Heat;Ultrasound;Manual techniques;Dry needling;Functional mobility training;Therapeutic activities;Therapeutic exercise;Passive range of motion;Taping    PT Next Visit Plan  end of POC, assess goals and need for further visits. Assess all goals - FOTO.    PT Home Exercise Plan  [id number]    Consulted and Agree with Plan of Care  Patient       Patient will benefit from skilled therapeutic intervention in order to improve the following deficits and impairments:  Pain, Impaired UE functional use, Increased fascial restricitons, Increased muscle spasms, Hypermobility, Decreased strength, Decreased range of motion, Decreased mobility, Decreased activity tolerance  Visit Diagnosis: Pain in left wrist  Other symptoms and signs involving the musculoskeletal system  Muscle weakness (generalized)     Problem List Patient Active Problem List   Diagnosis Date Noted  . Carpal tunnel syndrome, right 12/14/2018  . Degenerative arthritis of left knee 12/14/2018  . Nonallopathic lesion of sacral region 02/17/2018  . Fecal incontinence 01/21/2018  .  Tubular  adenoma 10/09/2017  . Generalized hypermobility of joints 09/22/2017  . PTSD (post-traumatic stress disorder) 09/22/2017  . Chronic hepatitis, needs q 6 month liver US 09/19/2017  . Stress fracture, right foot, subsequent encounter for fracture with delayed healing 09/19/2017  . Acid reflux 09/19/2017  . Dysphagia 09/19/2017  . Arthritis 09/19/2017  . IBS (irritable bowel syndrome) 09/19/2017  . Vitamin D deficiency 09/19/2017  . Plantar fasciitis 09/19/2017  . Tear of deltoid ligament of ankle, left, initial encounter 07/08/2017  . Tension type headache, prn Fioricet 06/18/2017  . Lumbar radiculopathy, right 08/19/2016  . Trigger point of right shoulder region 04/22/2016  . Slipped rib syndrome 04/22/2016  . Nonallopathic lesion of rib cage 04/22/2016  . Nonallopathic lesion of cervical region 04/22/2016  . Nonallopathic lesion of lumbosacral region 04/22/2016  . Nonallopathic lesion of thoracic region 04/22/2016  . Piriformis syndrome of right side 04/22/2016  . Osteopenia 11/01/2015  . Left navicular fracture of foot 10/11/2015  . High risk medication use 09/20/2014  . Tibialis posterior tendinitis 12/14/2013  . De Quervain's tenosynovitis, left 12/14/2013  . Sinusitis, chronic 08/30/2011  . Deviated nasal septum 08/30/2011  . Allergic rhinitis 06/08/2007  . Insomnia, psychophsiologic 06/08/2007   Kerin Perna, PTA 03/02/19 11:50 AM  [facility] [place], [place], [address] Phone: [phone]   Fax:  [phone]  Name: Sherry [name] MRN: [id number] Date of Birth: 19-Sep-1955

## 2019-03-23 ENCOUNTER — Ambulatory Visit: Payer: Self-pay

## 2019-03-23 ENCOUNTER — Encounter: Payer: Self-pay | Admitting: Family Medicine

## 2019-03-23 ENCOUNTER — Other Ambulatory Visit: Payer: Self-pay

## 2019-03-23 ENCOUNTER — Ambulatory Visit (INDEPENDENT_AMBULATORY_CARE_PROVIDER_SITE_OTHER): Payer: No Typology Code available for payment source | Admitting: Physical Therapy

## 2019-03-23 ENCOUNTER — Ambulatory Visit: Payer: No Typology Code available for payment source | Admitting: Family Medicine

## 2019-03-23 VITALS — BP 132/82 | HR 89 | Ht 67.0 in

## 2019-03-23 DIAGNOSIS — M25532 Pain in left wrist: Secondary | ICD-10-CM

## 2019-03-23 DIAGNOSIS — M6281 Muscle weakness (generalized): Secondary | ICD-10-CM

## 2019-03-23 DIAGNOSIS — M654 Radial styloid tenosynovitis [de Quervain]: Secondary | ICD-10-CM | POA: Diagnosis not present

## 2019-03-23 DIAGNOSIS — R29898 Other symptoms and signs involving the musculoskeletal system: Secondary | ICD-10-CM | POA: Diagnosis not present

## 2019-03-23 NOTE — Progress Notes (Signed)
[name] [address] [place], [place] [address] Phone: [phone] Subjective:   Sherry [name], am serving as a scribe for Dr. Hulan Saas.    CC: Left wrist pain follow-up  [id number]   02/16/2019 Repeat injection given today.  Discussed icing regimen and home exercises, discussed with the patient doing which wants to avoid.  Patient is to increase activity as tolerated.  Patient will continue the brace nightly for another 2 weeks.  If continuing to have difficulty I would encourage patient to consider an MRI for further evaluation and possible surgical intervention.  Spent  25 minutes with patient face-to-face and had greater than 50% of counseling including as described above in assessment and plan.  Update 03/23/2019 Sherry [name] [id number] is a 63 y.o. female coming in with complaint of left wrist pain. Patient has been doing physical therapy since last visit. Patient states that the injection did help.  Patient states that she is feeling 99% better at this time.  Has been able to not even wear the brace at work for the last 3 shifts.    Past Medical History:  Diagnosis Date  . Deviated septum   . Hepatitis    hx hep b 1984  . Hyperlipemia   . Hypothyroidism   . PONV (postoperative nausea and vomiting)   . Sinusitis, chronic   . Tubular adenoma 10/09/2017   [name] high-grade dysplasia or malignancy    Past Surgical History:  Procedure Laterality Date  . ANTERIOR CRUCIATE LIGAMENT REPAIR Right   . HAND SURGERY Left   . HERPES SIMPLEX VIRUS DFA    . KNEE SURGERY Left   . NASAL SEPTOPLASTY W/ TURBINOPLASTY  08/30/2011   Procedure: NASAL SEPTOPLASTY WITH TURBINATE REDUCTION;  Surgeon: Jerrell Belfast, MD;  Location: [facility];  Service: ENT;  Laterality: N/A;  nasal septoplasty, bil. inferior turbinated reduction, bil. endoscopic concha bullosa and anterior ethmoid and maxillary antrostomy, ethmoidectomy  . NASAL SINUS SURGERY   08/30/2011   Procedure: ENDOSCOPIC SINUS SURGERY;  Surgeon: Jerrell Belfast, MD;  Location: [facility];  Service: ENT;  Laterality: N/A;  . TUBAL LIGATION     Social History   Socioeconomic History  . Marital status: Divorced    Spouse name: Not on file  . Number of children: Not on file  . Years of education: Not on file  . Highest education level: Not on file  Occupational History    Employer: [employer] Needs  . Financial resource strain: Not on file  . Food insecurity    Worry: Not on file    Inability: Not on file  . Transportation needs    Medical: Not on file    Non-medical: Not on file  Tobacco Use  . Smoking status: Never Smoker  . Smokeless tobacco: Never Used  Substance and Sexual Activity  . Alcohol use: [name]  . Drug use: [name]  . Sexual activity: Not on file  Lifestyle  . Physical activity    Days per week: Not on file    Minutes per session: Not on file  . Stress: Not on file  Relationships  . Social Herbalist on phone: Not on file    Gets together: Not on file    Attends religious service: Not on file    Active member of club or organization: Not on file    Attends meetings of clubs or organizations: Not on file  Relationship status: Not on file  Other Topics Concern  . Not on file  Social History Narrative  . Not on file   Allergies  Allergen Reactions  . Other Other (See Comments)    Antiretroviral, caused jaundice   Family History  Problem Relation Age of Onset  . Leukemia Mother   . Lung cancer Father      Current Outpatient Medications (Cardiovascular):  .  nitroGLYCERIN (NITRO-DUR) 0.1 mg/hr patch, 1/4 patch daily  Current Outpatient Medications (Respiratory):  .  azelastine (ASTELIN) 0.1 % nasal spray, Place 1 spray into both nostrils 2 (two) times daily. Use in each nostril as directed .  fluticasone (FLONASE) 50 MCG/ACT nasal spray, Place 2 sprays into both nostrils daily.   Current Outpatient  Medications (Hematological):  .  cyanocobalamin (,VITAMIN B-12,) 1000 MCG/ML injection, Inject 1 mL IM once weekly for 4 weeks and then once monthly.  Current Outpatient Medications (Other):  Marland Kitchen  AMBULATORY NON FORMULARY MEDICATION, Medication Name: orthopaedic shoes.  For chronic back pain .  AMBULATORY NON FORMULARY MEDICATION, Even up shoe balancer. .  nystatin-triamcinolone ointment (MYCOLOG), Apply 1 application topically 2 (two) times daily. .  SYRINGE-NEEDLE, DISP, 3 ML 25G X 1" 3 ML MISC, Use to inject B12 IM .  Turmeric 500 MG CAPS, Take 1 capsule by mouth daily. Marland Kitchen  zolpidem (AMBIEN) 10 MG tablet, Take 10 mg by mouth as needed. .  gabapentin (NEURONTIN) 100 MG capsule, Take 2 capsules (200 mg total) by mouth at bedtime.    Past medical history, social, surgical and family history all reviewed in electronic medical record.  [name] pertanent information unless stated regarding to the chief complaint.   Review of Systems:  [name] headache, visual changes, nausea, vomiting, diarrhea, constipation, dizziness, abdominal pain, skin rash, fevers, chills, night sweats, weight loss, swollen lymph nodes, body aches, joint swelling, muscle aches, chest pain, shortness of breath, mood changes.   Objective  Blood pressure 132/82, pulse 89, height 5\' 7"  (1.702 m), SpO2 98 %.    General: [name] apparent distress alert and oriented x3 mood and affect normal, dressed appropriately.  HEENT: Pupils equal, extraocular movements intact  Respiratory: Patient's speak in full sentences and does not appear short of breath  Cardiovascular: [name] lower extremity edema, non tender, [name] erythema  Skin: Warm dry intact with [name] signs of infection or rash on extremities or on axial skeleton.  Abdomen: Soft nontender  Neuro: Cranial nerves II through XII are intact, neurovascularly intact in all extremities with 2+ DTRs and 2+ pulses.  Lymph: [name] lymphadenopathy of posterior or anterior cervical chain or axillae bilaterally.   Gait normal with good balance and coordination.  MSK:  Non tender with full range of motion and good stability and symmetric strength and tone of shoulders, elbows,  hip, knee and ankles bilaterally.  Wrist: lerft Inspection normal with [name] visible erythema or swelling. ROM smooth and normal with good flexion and extension and ulnar/radial deviation that is symmetrical with opposite wrist. Palpation is normal over metacarpals, navicular, lunate, and TFCC; tendons without tenderness/ swelling [name] snuffbox tenderness. [name] tenderness over Canal of Gu[place]. Strength 5/5 in all directions without pain. Negative Finkelstein, tinel's and phalens. Negative Watson's test.  MSK US performed of: Left wrist This study was ordered, performed, and interpreted by Sherry [name] D.O.  Wrist: Limited ultrasound of patient's abductor pollicis longus tendon shows that there is some mild residual hypoechoic changes within the tendon sheath but significantly improved from previous exam. TFCC intact. Scapholunate  ligament intact. Carpal tunnel visualized and median nerve area normal, flexor tendons all normal in appearance without fraying, tears, or sheath effusions. Power doppler signal normal.  IMPRESSION: ABDUCTOR POLLICIS longus tendon with improvement   Impression and Recommendations:     This case required medical decision making of moderate complexity. The above documentation has been reviewed and is accurate and complete Lyndal Pulley, DO       Note: This dictation was prepared with Dragon dictation along with smaller phrase technology. Any transcriptional errors that result from this process are unintentional.

## 2019-03-23 NOTE — Assessment & Plan Note (Signed)
improvement noted.  Follow-up as needed

## 2019-03-23 NOTE — Therapy (Addendum)
[facility] [place], [place], [address] Phone: [phone]   Fax:  [phone]  Physical Therapy Treatment  Patient Details  Name: Sherry [name] MRN: [id number] Date of Birth: Jul 16, 1955 Referring Provider (PT): Dr Hulan Saas    Encounter Date: 03/23/2019  PT End of Session - 03/23/19 1020    Visit Number  6    Number of Visits  12    Date for PT Re-Evaluation  03/12/19    PT Start Time  1017    PT Stop Time  1050    PT Time Calculation (min)  33 min       Past Medical History:  Diagnosis Date  . Deviated septum   . Hepatitis    hx hep b 1984  . Hyperlipemia   . Hypothyroidism   . PONV (postoperative nausea and vomiting)   . Sinusitis, chronic   . Tubular adenoma 10/09/2017   No high-grade dysplasia or malignancy     Past Surgical History:  Procedure Laterality Date  . ANTERIOR CRUCIATE LIGAMENT REPAIR Right   . HAND SURGERY Left   . HERPES SIMPLEX VIRUS DFA    . KNEE SURGERY Left   . NASAL SEPTOPLASTY W/ TURBINOPLASTY  08/30/2011   Procedure: NASAL SEPTOPLASTY WITH TURBINATE REDUCTION;  Surgeon: Jerrell Belfast, MD;  Location: [facility];  Service: ENT;  Laterality: N/A;  nasal septoplasty, bil. inferior turbinated reduction, bil. endoscopic concha bullosa and anterior ethmoid and maxillary antrostomy, ethmoidectomy  . NASAL SINUS SURGERY  08/30/2011   Procedure: ENDOSCOPIC SINUS SURGERY;  Surgeon: Jerrell Belfast, MD;  Location: [facility];  Service: ENT;  Laterality: N/A;  . TUBAL LIGATION      There were no vitals filed for this visit.  Subjective Assessment - 03/23/19 1020    Subjective  Pt reports she was able to work 3 days without pain, without brace. Had visit with MD this morning; all is well, only minimal swellingin wrist.    Currently in Pain?  Yes    Pain Score  1     Pain Orientation  Left    Pain Descriptors / Indicators  Dull    Aggravating  Factors   ?    Pain Relieving Factors  rest         [facility] PT Assessment - 03/23/19 0001      Assessment   Medical Diagnosis  Lt wrist pain     Referring Provider (PT)  Dr Hulan Saas     Onset Date/Surgical Date  10/14/18    Hand Dominance  Right    Next MD Visit  PRN    Prior Therapy  now for wrist       Observation/Other Assessments   Focus on Therapeutic Outcomes (FOTO)   29% limitation      AROM   Left Wrist Extension  70 Degrees    Left Wrist Flexion  60 Degrees    Left Wrist Radial Deviation  37 Degrees    Left Wrist Ulnar Deviation  47 Degrees      Strength   Right Hand Grip (lbs)  63    Right Hand Lateral Pinch  9 lbs    Right Hand 3 Point Pinch  7 lbs    Left Hand Grip (lbs)  59    Left Hand Lateral Pinch  9 lbs    Left Hand 3 Point Pinch  10 lbs  [facility] Adult PT Treatment/Exercise - 03/23/19 0001      Wrist Exercises   Wrist Flexion  Strengthening;Left;10 reps   2 sets   Bar Weights/Barbell (Wrist Flexion)  3 lbs    Wrist Extension  Strengthening;Left;10 reps   2 sets   Bar Weights/Barbell (Wrist Extension)  3 lbs    Wrist Radial Deviation  Left;10 reps   2 sets   Bar Weights/Barbell (Radial Deviation)  3 lbs    Wrist Ulnar Deviation  Left;10 reps;Seated   2 sets   Bar Weights/Barbell (Ulnar Deviation)  3 lbs    Other wrist exercises  Lt forearm pronation/supination with 2#, x10, 2 sets    Other wrist exercises  Lt wrist ext/ flex stretch x 20 sec, ulnar deviation stretch x 20 sec x 2 reps;  wrist circles x 5;  medium hand master - grip/ finger ext x 12 reps (gentle)       Manual Therapy   Soft tissue mobilization  STM to Lt forearm and thenar emminance             PT Education - 03/23/19 1058    Education Details  HEP - added increased sets of reps and to perform on Rt hand/wrist as well for preventative.    Person(s) Educated  Patient    Methods  Explanation   pt declined handout   Comprehension  Verbalized  understanding          PT Long Term Goals - 03/23/19 1029      PT LONG TERM GOAL #1   Title  Decrease pain Lt wrist and thumb to 0/10 to 4/10 at worst 03/12/2019    Time  6    Period  Weeks    Status  Achieved      PT LONG TERM GOAL #2   Title  Increase AROM Lt wrist and thumb to equal AROM Rt wrist and thumb 03/12/2019    Time  6    Period  Weeks    Status  Achieved      PT LONG TERM GOAL #3   Title  Increase strength Lt wrist and hand to 4/5 to 4+/5 and Lt grip strength to 40-45 #  03/12/2019    Time  6    Period  Weeks    Status  Achieved      PT LONG TERM GOAL #4   Title  Independent in HEP 03/12/2019    Time  6    Period  Weeks    Status  Achieved      PT LONG TERM GOAL #5   Title  Improve FOTO to </= 43% limitation 03/12/2019    Time  6    Period  Weeks    Status  Achieved            Plan - 03/23/19 1057    Clinical Impression Statement  Continued improvement in Lt hand strength and ROM.  FOTO score improved.  Pt tolerated increased reps without difficulty.  Pt has met all goals and verbalized readiness to d/c to HEP.    Rehab Potential  Good    PT Frequency  2x / week    PT Duration  6 weeks    PT Treatment/Interventions  Patient/family education;ADLs/Self Care Home Management;Cryotherapy;Electrical Stimulation;Iontophoresis 42m/ml Dexamethasone;Moist Heat;Ultrasound;Manual techniques;Dry needling;Functional mobility training;Therapeutic activities;Therapeutic exercise;Passive range of motion;Taping    PT Next Visit Plan  spoke to supervising PT; will d/c at this time.    PT Home Exercise Plan  [id number]    Consulted and Agree with Plan of Care  Patient       Patient will benefit from skilled therapeutic intervention in order to improve the following deficits and impairments:  Pain, Impaired UE functional use, Increased fascial restricitons, Increased muscle spasms, Hypermobility, Decreased strength, Decreased range of motion, Decreased mobility, Decreased  activity tolerance  Visit Diagnosis: Pain in left wrist  Other symptoms and signs involving the musculoskeletal system  Muscle weakness (generalized)     Problem List Patient Active Problem List   Diagnosis Date Noted  . Carpal tunnel syndrome, right 12/14/2018  . Degenerative arthritis of left knee 12/14/2018  . Nonallopathic lesion of sacral region 02/17/2018  . Fecal incontinence 01/21/2018  . Tubular adenoma 10/09/2017  . Generalized hypermobility of joints 09/22/2017  . PTSD (post-traumatic stress disorder) 09/22/2017  . Chronic hepatitis, needs q 6 month liver US 09/19/2017  . Stress fracture, right foot, subsequent encounter for fracture with delayed healing 09/19/2017  . Acid reflux 09/19/2017  . Dysphagia 09/19/2017  . Arthritis 09/19/2017  . IBS (irritable bowel syndrome) 09/19/2017  . Vitamin D deficiency 09/19/2017  . Plantar fasciitis 09/19/2017  . Tear of deltoid ligament of ankle, left, initial encounter 07/08/2017  . Tension type headache, prn Fioricet 06/18/2017  . Lumbar radiculopathy, right 08/19/2016  . Trigger point of right shoulder region 04/22/2016  . Slipped rib syndrome 04/22/2016  . Nonallopathic lesion of rib cage 04/22/2016  . Nonallopathic lesion of cervical region 04/22/2016  . Nonallopathic lesion of lumbosacral region 04/22/2016  . Nonallopathic lesion of thoracic region 04/22/2016  . Piriformis syndrome of right side 04/22/2016  . Osteopenia 11/01/2015  . Left navicular fracture of foot 10/11/2015  . High risk medication use 09/20/2014  . Tibialis posterior tendinitis 12/14/2013  . De Quervain's tenosynovitis, left 12/14/2013  . Sinusitis, chronic 08/30/2011  . Deviated nasal septum 08/30/2011  . Allergic rhinitis 06/08/2007  . Insomnia, psychophsiologic 06/08/2007   Jennifer Carlson-Long, PTA 03/23/19 11:05 AM   [facility] [address] , [place], [address] Phone:  [phone]   Fax:  [phone]  Name: Sherry [name] MRN: [id number] Date of Birth: 04/04/1956  PHYSICAL THERAPY DISCHARGE SUMMARY  Visits from Start of Care: 6  Current functional level related to goals / functional outcomes: See progress note for discharge status    Remaining deficits: Needs to continue conservation measures and HEP    Education / Equipment: HEP  Plan: Patient agrees to discharge.  Patient goals were met. Patient is being discharged due to meeting the stated rehab goals.  ?????     Celyn P. Holt PT, MPH 03/25/19 9:09 AM   

## 2019-04-08 ENCOUNTER — Encounter: Payer: Self-pay | Admitting: Family Medicine

## 2019-04-19 ENCOUNTER — Encounter: Payer: Self-pay | Admitting: Physician Assistant

## 2019-04-19 ENCOUNTER — Other Ambulatory Visit: Payer: Self-pay

## 2019-04-19 ENCOUNTER — Ambulatory Visit (INDEPENDENT_AMBULATORY_CARE_PROVIDER_SITE_OTHER): Payer: No Typology Code available for payment source | Admitting: Physician Assistant

## 2019-04-19 ENCOUNTER — Telehealth: Payer: Self-pay | Admitting: *Deleted

## 2019-04-19 VITALS — BP 142/88 | HR 78 | Temp 98.0°F | Ht 67.0 in | Wt 183.0 lb

## 2019-04-19 DIAGNOSIS — R1011 Right upper quadrant pain: Secondary | ICD-10-CM

## 2019-04-19 NOTE — Patient Instructions (Signed)
It was great to see you!  Let's get you to [facility] so they can help [place] figure out if there is further work-up we need to do.  I will be in touch with your lab results as soon as they return.  Take care,  [name] PA-C

## 2019-04-19 NOTE — Telephone Encounter (Signed)
Spoke to pt c/o RUQ pain since August off and on became worse over the weekend, radiates to her back at times. Had nausea on Friday, no vomiting. Pt states she has had this pain before and it was her liver. Asked pt if she can come in today at 3:00 pm to see [name], PA. Pt said yes. Appt scheduled for today at 3:00 pm told her to be here at 2:45 pm. Pt verbalized understanding. Told pt if pain gets severe to go to the ED. Pt verbalized understanding.

## 2019-04-19 NOTE — Progress Notes (Signed)
Sherry [name] is a 63 y.o. female here for a follow up of a pre-existing problem.  I acted as a Education administrator for [employer], PA-C Anselmo Pickler, LPN  History of Present Illness:   Chief Complaint  Patient presents with  . Abdominal Pain    RUQ    HPI   RUQ pain Pt c/o RUQ pain since August off and on, became worse over the weekend, radiates to her back at times. Had nausea on Friday, no vomiting. Pt states she has had this pain before and it was her liver.  Patient does have a history of hepatitis B, from a needlestick back in the 36s.  She had the ultrasound done by PCP in July of this year.  She states that she did have a 5% of heavy alcohol abuse when she was going through a divorce.  She does not drink alcohol hardly anymore.  She does not take excessive Tylenol.  She denies severe abdominal pain presently, but did have some "sharp,shooting pains" over the weekend that was a 7 out of 10.  She reports that she has never had elevated LFTs.  She states that her pain is in no way associated with food.  Also become concerned because her mother had leukemia, her father had lung cancer and her maternal grandmother had either gallbladder or liver cancer.   With the following findings:  CLINICAL DATA:  History of hepatitis B  EXAM: ULTRASOUND ABDOMEN LIMITED RIGHT UPPER QUADRANT  COMPARISON:  July 12, 2013  FINDINGS: Gallbladder:  No gallstones or wall thickening visualized. No sonographic Murphy sign noted by sonographer.  Common bile duct:  Diameter: 0.3 cm Liver:  The liver is coarsened and heterogeneous. The echogenicity is increased. There is a 1.2 cm cyst in the left hepatic lobe. Portal vein is patent on color Doppler imaging with normal direction of blood flow towards the liver.  IMPRESSION: 1. No acute sonographic abnormality detected. 2. Coarsened heterogeneous liver parenchyma with increased echogenicity can be seen in patients with hepatocellular  disease. There is no discrete hepatic mass. 3. Continued routine surveillance with ultrasound is recommended every 6 months given the patient's history of hepatitis-B.  She is rating her pain at present 1/10.  Past Medical History:  Diagnosis Date  . Deviated septum   . Hepatitis    hx hep b 1984  . Hyperlipemia   . Hypothyroidism   . PONV (postoperative nausea and vomiting)   . Sinusitis, chronic   . Tubular adenoma 10/09/2017   No high-grade dysplasia or malignancy      Social History   Socioeconomic History  . Marital status: Divorced    Spouse name: Not on file  . Number of children: Not on file  . Years of education: Not on file  . Highest education level: Not on file  Occupational History    Employer: [employer]  . Financial resource strain: Not on file  . Food insecurity    Worry: Not on file    Inability: Not on file  . Transportation needs    Medical: Not on file    Non-medical: Not on file  Tobacco Use  . Smoking status: Never Smoker  . Smokeless tobacco: Never Used  Substance and Sexual Activity  . Alcohol use: No  . Drug use: No  . Sexual activity: Not on file  Lifestyle  . Physical activity    Days per week: Not on file    Minutes per session: Not  on file  . Stress: Not on file  Relationships  . Social Herbalist on phone: Not on file    Gets together: Not on file    Attends religious service: Not on file    Active member of club or organization: Not on file    Attends meetings of clubs or organizations: Not on file    Relationship status: Not on file  . Intimate partner violence    Fear of current or ex partner: Not on file    Emotionally abused: Not on file    Physically abused: Not on file    Forced sexual activity: Not on file  Other Topics Concern  . Not on file  Social History Narrative  . Not on file    Past Surgical History:  Procedure Laterality Date  . ANTERIOR CRUCIATE LIGAMENT REPAIR Right   .  HAND SURGERY Left   . HERPES SIMPLEX VIRUS DFA    . KNEE SURGERY Left   . NASAL SEPTOPLASTY W/ TURBINOPLASTY  08/30/2011   Procedure: NASAL SEPTOPLASTY WITH TURBINATE REDUCTION;  Surgeon: Jerrell Belfast, MD;  Location: [facility];  Service: ENT;  Laterality: N/A;  nasal septoplasty, bil. inferior turbinated reduction, bil. endoscopic concha bullosa and anterior ethmoid and maxillary antrostomy, ethmoidectomy  . NASAL SINUS SURGERY  08/30/2011   Procedure: ENDOSCOPIC SINUS SURGERY;  Surgeon: Jerrell Belfast, MD;  Location: [facility];  Service: ENT;  Laterality: N/A;  . TUBAL LIGATION      Family History  Problem Relation Age of Onset  . Leukemia Mother   . Lung cancer Father     Allergies  Allergen Reactions  . Other Other (See Comments)    Antiretroviral, caused jaundice    Current Medications:   Current Outpatient Medications:  .  azelastine (ASTELIN) 0.1 % nasal spray, Place 1 spray into both nostrils 2 (two) times daily. Use in each nostril as directed, Disp: 30 mL, Rfl: 12 .  cyanocobalamin (,VITAMIN B-12,) 1000 MCG/ML injection, Inject 1 mL IM once weekly for 4 weeks and then once monthly., Disp: 8 mL, Rfl: 0 .  fluticasone (FLONASE) 50 MCG/ACT nasal spray, Place 2 sprays into both nostrils daily., Disp: 16 g, Rfl: 6 .  nystatin-triamcinolone ointment (MYCOLOG), Apply 1 application topically 2 (two) times daily., Disp: 30 g, Rfl: 0 .  SYRINGE-NEEDLE, DISP, 3 ML 25G X 1" 3 ML MISC, Use to inject B12 IM, Disp: 20 each, Rfl: 0 .  Turmeric 500 MG CAPS, Take 1 capsule by mouth daily., Disp: , Rfl:  .  VITAMIN D PO, Take 10,000 Units by mouth daily., Disp: , Rfl:  .  zolpidem (AMBIEN) 10 MG tablet, Take 10 mg by mouth as needed., Disp: , Rfl:    Review of Systems:   ROS  Negative unless otherwise specified per HPI.   Vitals:   Vitals:   04/19/19 1514  BP: (!) 142/88  Pulse: 78  Temp: 98 F (36.7 C)  TempSrc: Temporal  SpO2: 99%  Weight: 183  lb (83 kg)  Height: 5\' 7"  (1.702 m)     Body mass index is 28.66 kg/m.  Physical Exam:   Physical Exam Vitals signs and nursing note reviewed.  Constitutional:      General: She is not in acute distress.    Appearance: She is well-developed. She is not ill-appearing or toxic-appearing.  Cardiovascular:     Rate and Rhythm: Normal rate and regular rhythm.     Pulses:  Normal pulses.     Heart sounds: Normal heart sounds, S1 normal and S2 normal.     Comments: No LE edema Pulmonary:     Effort: Pulmonary effort is normal.     Breath sounds: Normal breath sounds.  Abdominal:     General: Abdomen is flat. Bowel sounds are normal.     Palpations: Abdomen is soft.     Tenderness: There is abdominal tenderness in the right upper quadrant. There is no right CVA tenderness, left CVA tenderness, guarding or rebound.  Skin:    General: Skin is warm and dry.  Neurological:     Mental Status: She is alert.     GCS: GCS eye subscore is 4. GCS verbal subscore is 5. GCS motor subscore is 6.  Psychiatric:        Speech: Speech normal.        Behavior: Behavior normal. Behavior is cooperative.     Assessment and Plan:   Chlo… was seen today for abdominal pain.  Diagnoses and all orders for this visit:  RUQ pain -     CBC with Differential/Platelet -     Comprehensive metabolic panel    Discussed that I feel like she would be best served at [facility], as they can help provide more extensive expertise regarding her symptoms and her history of hepatitis B.  Patient verbalized understanding.  We will check a CBC and CMP and refer to [facility] for their input.  Appreciate coordination of care.  . Reviewed expectations re: course of current medical issues. . Discussed self-management of symptoms. . Outlined signs and symptoms indicating need for more acute intervention. . Patient verbalized understanding and all questions were answered. . See orders for this visit as documented in the  electronic medical record. . Patient received an After-Visit Summary.  CMA or LPN served as scribe during this visit. History, Physical, and Plan performed by medical provider. The above documentation has been reviewed and is accurate and complete.   [name], PA-C

## 2019-04-20 LAB — CBC WITH DIFFERENTIAL/PLATELET
Basophils Absolute: 0 10*3/uL (ref 0.0–0.1)
Basophils Relative: 0.8 % (ref 0.0–3.0)
Eosinophils Absolute: 0.1 10*3/uL (ref 0.0–0.7)
Eosinophils Relative: 1.1 % (ref 0.0–5.0)
HCT: 39 % (ref 36.0–46.0)
Hemoglobin: 13 g/dL (ref 12.0–15.0)
Lymphocytes Relative: 22.2 % (ref 12.0–46.0)
Lymphs Abs: 1.2 10*3/uL (ref 0.7–4.0)
MCHC: 33.3 g/dL (ref 30.0–36.0)
MCV: 88.7 fl (ref 78.0–100.0)
Monocytes Absolute: 0.3 10*3/uL (ref 0.1–1.0)
Monocytes Relative: 5.4 % (ref 3.0–12.0)
Neutro Abs: 3.9 10*3/uL (ref 1.4–7.7)
Neutrophils Relative %: 70.5 % (ref 43.0–77.0)
Platelets: 227 10*3/uL (ref 150.0–400.0)
RBC: 4.4 Mil/uL (ref 3.87–5.11)
RDW: 13.1 % (ref 11.5–15.5)
WBC: 5.6 10*3/uL (ref 4.0–10.5)

## 2019-04-20 LAB — COMPREHENSIVE METABOLIC PANEL
ALT: 9 U/L (ref 0–35)
AST: 14 U/L (ref 0–37)
Albumin: 4.4 g/dL (ref 3.5–5.2)
Alkaline Phosphatase: 82 U/L (ref 39–117)
BUN: 11 mg/dL (ref 6–23)
CO2: 27 mEq/L (ref 19–32)
Calcium: 9.7 mg/dL (ref 8.4–10.5)
Chloride: 105 mEq/L (ref 96–112)
Creatinine, Ser: 0.87 mg/dL (ref 0.40–1.20)
GFR: 65.37 mL/min (ref >60.00)
Glucose, Bld: 94 mg/dL (ref 70–99)
Potassium: 4.2 mEq/L (ref 3.5–5.1)
Sodium: 141 mEq/L (ref 135–145)
Total Bilirubin: 0.8 mg/dL (ref 0.2–1.2)
Total Protein: 6.8 g/dL (ref 6.0–8.3)

## 2019-04-21 ENCOUNTER — Encounter: Payer: Self-pay | Admitting: Physician Assistant

## 2019-04-22 ENCOUNTER — Other Ambulatory Visit: Payer: Self-pay | Admitting: Physician Assistant

## 2019-04-22 DIAGNOSIS — R1011 Right upper quadrant pain: Secondary | ICD-10-CM

## 2019-04-22 DIAGNOSIS — Z8619 Personal history of other infectious and parasitic diseases: Secondary | ICD-10-CM

## 2019-04-23 ENCOUNTER — Other Ambulatory Visit: Payer: Self-pay | Admitting: Family Medicine

## 2019-04-23 MED FILL — CYANOCOBALAMIN 1,000 MCG/ML: 1000 | 28 days supply | Qty: 4 | Fill #0

## 2019-04-23 MED FILL — BD 3 ML SYRINGE 25GX1": 25G X 1" | 84 days supply | Qty: 12 | Fill #2

## 2019-04-23 MED FILL — BD 3 ML SYRINGE 25GX1: 25G X 1" | 84 days supply | Qty: 12 | Fill #2

## 2019-04-27 ENCOUNTER — Encounter: Payer: Self-pay | Admitting: Family Medicine

## 2019-04-28 ENCOUNTER — Encounter: Payer: Self-pay | Admitting: Physician Assistant

## 2019-04-29 NOTE — Telephone Encounter (Signed)
Left message on voicemail to call office.  

## 2019-04-30 ENCOUNTER — Encounter: Payer: Self-pay | Admitting: Physician Assistant

## 2019-04-30 ENCOUNTER — Ambulatory Visit (INDEPENDENT_AMBULATORY_CARE_PROVIDER_SITE_OTHER): Payer: No Typology Code available for payment source | Admitting: Physician Assistant

## 2019-04-30 ENCOUNTER — Other Ambulatory Visit: Payer: Self-pay

## 2019-04-30 VITALS — BP 140/78 | HR 72 | Temp 97.8°F | Ht 67.0 in | Wt 182.0 lb

## 2019-04-30 DIAGNOSIS — S90424A Blister (nonthermal), right lesser toe(s), initial encounter: Secondary | ICD-10-CM | POA: Diagnosis not present

## 2019-04-30 DIAGNOSIS — B351 Tinea unguium: Secondary | ICD-10-CM | POA: Diagnosis not present

## 2019-04-30 DIAGNOSIS — R2991 Unspecified symptoms and signs involving the musculoskeletal system: Secondary | ICD-10-CM | POA: Diagnosis not present

## 2019-04-30 NOTE — Progress Notes (Signed)
Sherry [name] [id number] is a 63 y.o. female here for a new problem.  I acted as a Education administrator for [employer], PA-C Anselmo Pickler, LPN  History of Present Illness:   Chief Complaint  Patient presents with  . Foreign body Lt foot    HPI   Foot problem Pt c/o splinter on bottom of Lt foot x 1 yr.  She is not sure what she stepped on, reports that she has neglected her feet in because she is having pain when she walks on her foot in this location, she would like for the foreign body to be removed.  She also has an area of blister formation from where her right great toe and right second toe rubbed together.  She is also been dealing with toenail fungus.  Denies any concerns for infection, fever.  Past Medical History:  Diagnosis Date  . Deviated septum   . Hepatitis    hx hep b 1984  . Hyperlipemia   . Hypothyroidism   . PONV (postoperative nausea and vomiting)   . Sinusitis, chronic   . Tubular adenoma 10/09/2017   No high-grade dysplasia or malignancy      Social History   Socioeconomic History  . Marital status: Divorced    Spouse name: Not on file  . Number of children: Not on file  . Years of education: Not on file  . Highest education level: Not on file  Occupational History    Employer: [employer]  . Financial resource strain: Not on file  . Food insecurity    Worry: Not on file    Inability: Not on file  . Transportation needs    Medical: Not on file    Non-medical: Not on file  Tobacco Use  . Smoking status: Never Smoker  . Smokeless tobacco: Never Used  Substance and Sexual Activity  . Alcohol use: No  . Drug use: No  . Sexual activity: Not on file  Lifestyle  . Physical activity    Days per week: Not on file    Minutes per session: Not on file  . Stress: Not on file  Relationships  . Social Herbalist on phone: Not on file    Gets together: Not on file    Attends religious service: Not on file    Active member of club or  organization: Not on file    Attends meetings of clubs or organizations: Not on file    Relationship status: Not on file  . Intimate partner violence    Fear of current or ex partner: Not on file    Emotionally abused: Not on file    Physically abused: Not on file    Forced sexual activity: Not on file  Other Topics Concern  . Not on file  Social History Narrative  . Not on file    Past Surgical History:  Procedure Laterality Date  . ANTERIOR CRUCIATE LIGAMENT REPAIR Right   . HAND SURGERY Left   . HERPES SIMPLEX VIRUS DFA    . KNEE SURGERY Left   . NASAL SEPTOPLASTY W/ TURBINOPLASTY  08/30/2011   Procedure: NASAL SEPTOPLASTY WITH TURBINATE REDUCTION;  Surgeon: Jerrell Belfast, MD;  Location: [facility];  Service: ENT;  Laterality: N/A;  nasal septoplasty, bil. inferior turbinated reduction, bil. endoscopic concha bullosa and anterior ethmoid and maxillary antrostomy, ethmoidectomy  . NASAL SINUS SURGERY  08/30/2011   Procedure: ENDOSCOPIC SINUS SURGERY;  Surgeon: Jerrell Belfast, MD;  Location: [facility];  Service: ENT;  Laterality: N/A;  . TUBAL LIGATION      Family History  Problem Relation Age of Onset  . Leukemia Mother   . Lung cancer Father     Allergies  Allergen Reactions  . Other Other (See Comments)    Antiretroviral, caused jaundice    Current Medications:   Current Outpatient Medications:  .  azelastine (ASTELIN) 0.1 % nasal spray, Place 1 spray into both nostrils 2 (two) times daily. Use in each nostril as directed, Disp: 30 mL, Rfl: 12 .  cyanocobalamin (,VITAMIN B-12,) 1000 MCG/ML injection, INJECT 1 ML INTO THE MUSCLE ONCE WEEKLY FOR 4 WEEKS AND THEN ONCE MONTHLY., Disp: 4 mL, Rfl: 0 .  fluticasone (FLONASE) 50 MCG/ACT nasal spray, Place 2 sprays into both nostrils daily., Disp: 16 g, Rfl: 6 .  nystatin-triamcinolone ointment (MYCOLOG), Apply 1 application topically 2 (two) times daily., Disp: 30 g, Rfl: 0 .  SYRINGE-NEEDLE,  DISP, 3 ML 25G X 1" 3 ML MISC, Use to inject B12 IM, Disp: 20 each, Rfl: 0 .  Turmeric 500 MG CAPS, Take 1 capsule by mouth daily., Disp: , Rfl:  .  VITAMIN D PO, Take 10,000 Units by mouth daily., Disp: , Rfl:  .  zolpidem (AMBIEN) 10 MG tablet, Take 10 mg by mouth as needed., Disp: , Rfl:    Review of Systems:   ROS Negative unless otherwise specified per HPI.  Vitals:   Vitals:   04/30/19 1415  BP: 140/78  Pulse: 72  Temp: 97.8 F (36.6 C)  TempSrc: Temporal  SpO2: 99%  Weight: 182 lb (82.6 kg)  Height: 5\' 7"  (1.702 m)     Body mass index is 28.51 kg/m.  Physical Exam:   Physical Exam Constitutional:      Appearance: She is well-developed.  HENT:     Head: Normocephalic and atraumatic.  Eyes:     Conjunctiva/sclera: Conjunctivae normal.  Neck:     Musculoskeletal: Normal range of motion and neck supple.  Pulmonary:     Effort: Pulmonary effort is normal.  Musculoskeletal: Normal range of motion.       Feet:  Feet:     Comments: Bilateral great toenails with distal nail changes (thickened) Skin:    General: Skin is warm and dry.  Neurological:     Mental Status: She is alert and oriented to person, place, and time.  Psychiatric:        Behavior: Behavior normal.        Thought Content: Thought content normal.        Judgment: Judgment normal.      Assessment and Plan:   [place] was seen today for foreign body lt foot.  Diagnoses and all orders for this visit:  Abnormal finding of foot Suspect foreign body. No infection on my exam. Given chronicity of this, will refer to podiatry for further intervention.  Toenail fungus No red flags, she has tried treatment in the past. Podiatry referral.  Blister of toe of right foot without infection, initial encounter Does not appear infected, no red flags. Podiatry referral as I feel this is related to an anatomical issue with her feet and/or shoes.   . Reviewed expectations re: course of current medical  issues. . Discussed self-management of symptoms. . Outlined signs and symptoms indicating need for more acute intervention. . Patient verbalized understanding and all questions were answered. . See orders for this visit as documented in the electronic medical  record. . Patient received an After-Visit Summary.  CMA or LPN served as scribe during this visit. History, Physical, and Plan performed by medical provider. The above documentation has been reviewed and is accurate and complete.   [name], PA-C

## 2019-05-06 ENCOUNTER — Ambulatory Visit (INDEPENDENT_AMBULATORY_CARE_PROVIDER_SITE_OTHER): Payer: No Typology Code available for payment source

## 2019-05-06 ENCOUNTER — Ambulatory Visit: Payer: No Typology Code available for payment source | Admitting: Podiatry

## 2019-05-06 ENCOUNTER — Encounter: Payer: Self-pay | Admitting: Podiatry

## 2019-05-06 ENCOUNTER — Other Ambulatory Visit: Payer: Self-pay

## 2019-05-06 VITALS — BP 143/75 | HR 75 | Resp 16

## 2019-05-06 DIAGNOSIS — M2041 Other hammer toe(s) (acquired), right foot: Secondary | ICD-10-CM

## 2019-05-06 DIAGNOSIS — L603 Nail dystrophy: Secondary | ICD-10-CM

## 2019-05-06 DIAGNOSIS — S90852A Superficial foreign body, left foot, initial encounter: Secondary | ICD-10-CM

## 2019-05-06 DIAGNOSIS — Q828 Other specified congenital malformations of skin: Secondary | ICD-10-CM

## 2019-05-08 NOTE — Progress Notes (Signed)
Subjective:  Patient ID: Sherry [name] [id number], female    DOB: October 17, 1955,  MRN: [id number] HPI Chief Complaint  Patient presents with  . Foot Pain    Sub 5th MPJ left - tiny knot x 2 years, thinks its a foreign body "a splinter maybe"  . Toe Pain    2nd toe right - hammertoe deformity x years, developing a blister between the toes  . Nail Problem    Hallux nail left - thick and discolored, was taking terbinafine, but started having belly pain and discontinued-history of Hep B  . New Patient (Initial Visit)    63 y.o. female presents with the above complaint.   ROS: Denies fever chills nausea vomiting muscle aches pains calf pain back pain chest pain shortness of breath.  Past Medical History:  Diagnosis Date  . Deviated septum   . Hepatitis    hx hep b 1984  . Hyperlipemia   . Hypothyroidism   . PONV (postoperative nausea and vomiting)   . Sinusitis, chronic   . Tubular adenoma 10/09/2017   No high-grade dysplasia or malignancy    Past Surgical History:  Procedure Laterality Date  . ANTERIOR CRUCIATE LIGAMENT REPAIR Right   . HAND SURGERY Left   . HERPES SIMPLEX VIRUS DFA    . KNEE SURGERY Left   . NASAL SEPTOPLASTY W/ TURBINOPLASTY  08/30/2011   Procedure: NASAL SEPTOPLASTY WITH TURBINATE REDUCTION;  Surgeon: Jerrell Belfast, MD;  Location: [facility];  Service: ENT;  Laterality: N/A;  nasal septoplasty, bil. inferior turbinated reduction, bil. endoscopic concha bullosa and anterior ethmoid and maxillary antrostomy, ethmoidectomy  . NASAL SINUS SURGERY  08/30/2011   Procedure: ENDOSCOPIC SINUS SURGERY;  Surgeon: Jerrell Belfast, MD;  Location: [facility];  Service: ENT;  Laterality: N/A;  . TUBAL LIGATION      Current Outpatient Medications:  .  azelastine (ASTELIN) 0.1 % nasal spray, Place 1 spray into both nostrils 2 (two) times daily. Use in each nostril as directed, Disp: 30 mL, Rfl: 12 .  cyanocobalamin (,VITAMIN B-12,) 1000 MCG/ML  injection, INJECT 1 ML INTO THE MUSCLE ONCE WEEKLY FOR 4 WEEKS AND THEN ONCE MONTHLY., Disp: 4 mL, Rfl: 0 .  fluticasone (FLONASE) 50 MCG/ACT nasal spray, Place 2 sprays into both nostrils daily., Disp: 16 g, Rfl: 6 .  nystatin-triamcinolone ointment (MYCOLOG), Apply 1 application topically 2 (two) times daily., Disp: 30 g, Rfl: 0 .  SYRINGE-NEEDLE, DISP, 3 ML 25G X 1" 3 ML MISC, Use to inject B12 IM, Disp: 20 each, Rfl: 0 .  Turmeric 500 MG CAPS, Take 1 capsule by mouth daily., Disp: , Rfl:  .  VITAMIN D PO, Take 10,000 Units by mouth daily., Disp: , Rfl:  .  zolpidem (AMBIEN) 10 MG tablet, Take 10 mg by mouth as needed., Disp: , Rfl:   Allergies  Allergen Reactions  . Other Other (See Comments)    Antiretroviral, caused jaundice   Review of Systems Objective:   Vitals:   05/06/19 1345  BP: (!) 143/75  Pulse: 75  Resp: 16    General: Well developed, nourished, in no acute distress, alert and oriented x3   Dermatological: Skin is warm, dry and supple bilateral. Nails x 10 are well maintained; remaining integument appears unremarkable at this time. There are no open sores, no preulcerative lesions, no rash or signs of infection present.  Vascular: Dorsalis Pedis artery and Posterior Tibial artery pedal pulses are 2/4 bilateral with immedate capillary fill time. Pedal  hair growth present. No varicosities and no lower extremity edema present bilateral.   Neruologic: Grossly intact via light touch bilateral. Vibratory intact via tuning fork bilateral. Protective threshold with Semmes Wienstein monofilament intact to all pedal sites bilateral. Patellar and Achilles deep tendon reflexes 2+ bilateral. No Babinski or clonus noted bilateral.   Musculoskeletal: No gross boney pedal deformities bilateral. No pain, crepitus, or limitation noted with foot and ankle range of motion bilateral. Muscular strength 5/5 in all groups tested bilateral.  Gait: Unassisted, Nonantalgic.    Radiographs:   No acute findings no foreign bodies noted.  Hammertoe deformity with elongated second metatarsal phalangeal joint right.  Assessment & Plan:   Assessment: Poor keratoma plantar aspect left foot.  Hammertoe deformity with hallux valgus right foot.  Nail dystrophy hallux left.  Plan: Samples of the nail were taken today to be sent for pathologic evaluation.  I enucleated a porokeratotic lesion subfourth met head left.  We discussed surgical intervention regarding the second toe.  I will follow-up with her in 1 month for pathology results.  She is already been taking terbinafine which caused her to have upset stomach.  We will not prescribe terbinafine due to stomach upset.  Renan Danese T. Wilder, [place]

## 2019-06-10 ENCOUNTER — Ambulatory Visit: Payer: No Typology Code available for payment source | Admitting: Podiatry

## 2019-06-10 ENCOUNTER — Encounter: Payer: Self-pay | Admitting: Podiatry

## 2019-06-10 ENCOUNTER — Other Ambulatory Visit: Payer: Self-pay

## 2019-06-10 DIAGNOSIS — L603 Nail dystrophy: Secondary | ICD-10-CM

## 2019-06-10 NOTE — Progress Notes (Signed)
She presents today to discuss her pathology results.  Objective: Vital signs are stable she is alert and oriented x3.  History of hepatitis B and was taking terbinafine at one time which elevated her liver enzymes.  Pathology report does demonstrate onychomycosis.  Assessment: Onychomycosis.  Plan: With a history of liver disease and no speciation for topical determination I recommend laser therapy.  At this point she would like to consider laser therapy but will notify us as to that date.  Due to the fact that she also has nail dystrophy she would more than likely benefit from laser therapy every other month rather than once a month.  This is mostly because of the slow growth of her toenails.

## 2019-07-16 ENCOUNTER — Ambulatory Visit: Payer: Self-pay | Admitting: *Deleted

## 2019-07-16 ENCOUNTER — Other Ambulatory Visit: Payer: Self-pay

## 2019-07-16 DIAGNOSIS — L603 Nail dystrophy: Secondary | ICD-10-CM

## 2019-07-16 DIAGNOSIS — B351 Tinea unguium: Secondary | ICD-10-CM

## 2019-07-16 NOTE — Progress Notes (Signed)
Patient presents today for the 1st laser treatment. Diagnosed with mycotic nail infection by Dr. Milinda Pointer. Toenail most affected are the hallux nails bilateral (L>R)  All other systems are negative.  Nails were filed thin. Laser therapy was administered to 1st toenails bilateral and patient tolerated the treatment well. All safety precautions were in place.    Follow up in 8 weeks for laser # 2.  Picture of nails taken today to document visual progress   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

## 2019-07-16 NOTE — Patient Instructions (Signed)

## 2019-09-10 ENCOUNTER — Other Ambulatory Visit: Payer: No Typology Code available for payment source

## 2019-09-17 ENCOUNTER — Ambulatory Visit (INDEPENDENT_AMBULATORY_CARE_PROVIDER_SITE_OTHER): Payer: No Typology Code available for payment source | Admitting: *Deleted

## 2019-09-17 ENCOUNTER — Other Ambulatory Visit: Payer: Self-pay

## 2019-09-17 DIAGNOSIS — L603 Nail dystrophy: Secondary | ICD-10-CM

## 2019-09-17 NOTE — Progress Notes (Signed)
Patient presents today for the 2nd laser treatment. Diagnosed with mycotic nail infection by Dr. Milinda Pointer.   Toenail most affected are the hallux nails bilateral (L>R). The coloration of the toenails look much better.  All other systems are negative.  Nails were filed thin. Laser therapy was administered to 1st toenails bilateral and patient tolerated the treatment well. All safety precautions were in place.    Follow up in 8 weeks for laser # 3.    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

## 2019-10-31 ENCOUNTER — Encounter: Payer: Self-pay | Admitting: Physician Assistant

## 2019-10-31 DIAGNOSIS — B181 Chronic viral hepatitis B without delta-agent: Secondary | ICD-10-CM

## 2019-11-05 ENCOUNTER — Other Ambulatory Visit: Payer: Self-pay

## 2019-11-05 ENCOUNTER — Ambulatory Visit
Admission: RE | Admit: 2019-11-05 | Discharge: 2019-11-05 | Disposition: A | Discharge status: Home / Self Care | Payer: No Typology Code available for payment source | Source: Ambulatory Visit | Attending: Physician Assistant | Admitting: Physician Assistant

## 2019-11-05 DIAGNOSIS — B181 Chronic viral hepatitis B without delta-agent: Secondary | ICD-10-CM

## 2019-11-07 ENCOUNTER — Encounter: Payer: Self-pay | Admitting: Physician Assistant

## 2019-11-08 ENCOUNTER — Other Ambulatory Visit: Payer: Self-pay | Admitting: Physician Assistant

## 2019-11-08 DIAGNOSIS — C787 Secondary malignant neoplasm of liver and intrahepatic bile duct: Secondary | ICD-10-CM

## 2019-11-09 ENCOUNTER — Ambulatory Visit (HOSPITAL_COMMUNITY)
Admission: RE | Admit: 2019-11-09 | Discharge: 2019-11-09 | Disposition: A | Discharge status: Home / Self Care | Payer: No Typology Code available for payment source | Source: Ambulatory Visit | Attending: Physician Assistant | Admitting: Physician Assistant

## 2019-11-09 ENCOUNTER — Other Ambulatory Visit: Payer: Self-pay

## 2019-11-09 DIAGNOSIS — C787 Secondary malignant neoplasm of liver and intrahepatic bile duct: Secondary | ICD-10-CM | POA: Diagnosis not present

## 2019-11-09 MED ORDER — GADOBUTROL 1 MMOL/ML IV SOLN
8.0000 mL | Freq: Once | INTRAVENOUS | Status: AC | PRN
  Administered 2019-11-09: 16:00:00 8 mL via INTRAVENOUS

## 2019-11-10 ENCOUNTER — Encounter: Payer: Self-pay | Admitting: Physician Assistant

## 2019-11-12 ENCOUNTER — Other Ambulatory Visit: Payer: No Typology Code available for payment source

## 2019-11-22 ENCOUNTER — Encounter: Payer: Self-pay | Admitting: Physician Assistant

## 2019-11-23 ENCOUNTER — Other Ambulatory Visit: Payer: Self-pay

## 2019-11-23 DIAGNOSIS — B181 Chronic viral hepatitis B without delta-agent: Secondary | ICD-10-CM

## 2019-11-23 DIAGNOSIS — C787 Secondary malignant neoplasm of liver and intrahepatic bile duct: Secondary | ICD-10-CM

## 2019-12-01 ENCOUNTER — Encounter: Payer: Self-pay | Admitting: Physician Assistant

## 2019-12-01 ENCOUNTER — Other Ambulatory Visit: Payer: Self-pay | Admitting: Physician Assistant

## 2019-12-01 DIAGNOSIS — M654 Radial styloid tenosynovitis [de Quervain]: Secondary | ICD-10-CM

## 2019-12-01 NOTE — Telephone Encounter (Signed)
Please advise 

## 2019-12-07 ENCOUNTER — Encounter: Payer: Self-pay | Admitting: Family Medicine

## 2019-12-07 ENCOUNTER — Other Ambulatory Visit: Payer: Self-pay

## 2019-12-07 ENCOUNTER — Ambulatory Visit: Payer: No Typology Code available for payment source | Admitting: Family Medicine

## 2019-12-07 ENCOUNTER — Ambulatory Visit (INDEPENDENT_AMBULATORY_CARE_PROVIDER_SITE_OTHER): Payer: No Typology Code available for payment source

## 2019-12-07 VITALS — BP 132/82 | HR 75 | Ht 67.0 in

## 2019-12-07 DIAGNOSIS — M25532 Pain in left wrist: Secondary | ICD-10-CM

## 2019-12-07 DIAGNOSIS — M654 Radial styloid tenosynovitis [de Quervain]: Secondary | ICD-10-CM

## 2019-12-07 NOTE — Patient Instructions (Signed)
Thank you for coming in today.  Plan for continue home exercise.  Let me know if you want to do some hand therapy.  Use the brace as needed.  Recheck as needed.  Call or go to the ER if you develop a large red swollen joint with extreme pain or oozing puss.   Take it easy for a while after the shot if you can.

## 2019-12-07 NOTE — Progress Notes (Signed)
   Sherry [name], am serving as a Education administrator for Dr. Lynne Leader.  Sherry [name] is a 64 y.o. female who presents to [place] at [facility] today for f/u of L wrist pain / DeQuervain's tenosynovitis.  She was last seen by Dr. Tamala Julian for these issues on 03/23/19 and noted 99% improvement in her symptoms after having an injection on 02/16/19.  She has a L wrist/thumb brace and has completed PT in the past for this issue.  Since her last visit w/ Dr. Tamala Julian, pt reports the L wrist is in worse shape than her R wrist but she has noticed her R wrist is starting to bother her again.  Pertinent review of systems: No fevers or chills  Relevant historical information: Hepatitis Works as a Product/process development scientist and delivery   Exam:  BP 132/82 (BP Location: Left Arm, Patient Position: Sitting, Cuff Size: Normal)   Pulse 75   Ht 5\' 7"  (1.702 m)   SpO2 98%   BMI 28.51 kg/m  General: Well Developed, well nourished, and in no acute distress.   MSK: Left wrist normal-appearing Tender palpation radial styloid. Normal motion. Normal strength. Positive Finkelstein's test.    Lab and Radiology Results Diagnostic Limited MSK Ultrasound of: Left wrist radial aspect. First dorsal wrist compartment with hypoechoic fluid within tendon sheath consistent with de Quervain's tenosynovitis Impression: De Quervain's tenosynovitis   Procedure: Real-time Ultrasound Guided Injection of left wrist first dorsal wrist compartment Device: Philips Affiniti 50G Images permanently stored and available for review in the ultrasound unit. Verbal informed consent obtained.  Discussed risks and benefits of procedure. Warned about infection bleeding damage to structures skin hypopigmentation and fat atrophy among others. Patient expresses understanding and agreement Time-out conducted.   Noted no overlying erythema, induration, or other signs of local infection.   Skin prepped in a sterile fashion.    Local anesthesia: Topical Ethyl chloride.   With sterile technique and under real time ultrasound guidance:  40 mg of Kenalog and 1 mL of Marcaine injected into tendon sheath .   Completed without difficulty   Pain immediately resolved suggesting accurate placement of the medication.   Advised to call if fevers/chills, erythema, induration, drainage, or persistent bleeding.   Images permanently stored and available for review in the ultrasound unit.  Impression: Technically successful ultrasound guided injection.       Assessment and Plan: 64 y.o. female with left wrist pain consistent with de Quervain's tenosynovitis.  Plan for injection as above and home exercise program.  Recheck back as needed.   PDMP not reviewed this encounter. Orders Placed This Encounter  Procedures  . [place] LIMITED JOINT SPACE STRUCTURES UP LEFT    Standing Status:   Future    Number of Occurrences:   1    Standing Expiration Date:   12/06/2020    Order Specific Question:   Reason for Exam (SYMPTOM  OR DIAGNOSIS REQUIRED)    Answer:   Left wrist pain    Order Specific Question:   Preferred imaging location?    Answer:   [place]   No orders of the defined types were placed in this encounter.    Discussed warning signs or symptoms. Please see discharge instructions. Patient expresses understanding.   The above documentation has been reviewed and is accurate and complete Lynne Leader, M.D.

## 2019-12-08 ENCOUNTER — Encounter: Payer: Self-pay | Admitting: Physician Assistant

## 2019-12-09 NOTE — Telephone Encounter (Signed)
Is this something urgent, or is it alright for the patient to wait until her scheduled time?  Please Advise.

## 2020-01-21 ENCOUNTER — Other Ambulatory Visit: Payer: No Typology Code available for payment source

## 2020-03-03 ENCOUNTER — Other Ambulatory Visit: Payer: No Typology Code available for payment source

## 2020-05-30 ENCOUNTER — Encounter: Payer: Self-pay | Admitting: Physician Assistant

## 2020-05-31 ENCOUNTER — Other Ambulatory Visit: Payer: Self-pay | Admitting: Physician Assistant

## 2020-05-31 DIAGNOSIS — M79671 Pain in right foot: Secondary | ICD-10-CM

## 2020-06-01 ENCOUNTER — Telehealth: Payer: Self-pay | Admitting: Family Medicine

## 2020-06-01 ENCOUNTER — Other Ambulatory Visit: Payer: Self-pay | Admitting: Family Medicine

## 2020-06-01 ENCOUNTER — Ambulatory Visit: Payer: Self-pay

## 2020-06-01 ENCOUNTER — Encounter: Payer: Self-pay | Admitting: *Deleted

## 2020-06-01 ENCOUNTER — Encounter: Payer: Self-pay | Admitting: Family Medicine

## 2020-06-01 ENCOUNTER — Ambulatory Visit (INDEPENDENT_AMBULATORY_CARE_PROVIDER_SITE_OTHER): Payer: No Typology Code available for payment source

## 2020-06-01 ENCOUNTER — Ambulatory Visit: Payer: No Typology Code available for payment source | Admitting: Family Medicine

## 2020-06-01 ENCOUNTER — Other Ambulatory Visit: Payer: Self-pay

## 2020-06-01 VITALS — BP 118/80 | HR 81 | Ht 67.0 in

## 2020-06-01 DIAGNOSIS — M8580 Other specified disorders of bone density and structure, unspecified site: Secondary | ICD-10-CM | POA: Diagnosis not present

## 2020-06-01 DIAGNOSIS — M25511 Pain in right shoulder: Secondary | ICD-10-CM

## 2020-06-01 DIAGNOSIS — M25519 Pain in unspecified shoulder: Secondary | ICD-10-CM | POA: Insufficient documentation

## 2020-06-01 DIAGNOSIS — M79671 Pain in right foot: Secondary | ICD-10-CM

## 2020-06-01 DIAGNOSIS — M84374G Stress fracture, right foot, subsequent encounter for fracture with delayed healing: Secondary | ICD-10-CM

## 2020-06-01 LAB — VITAMIN D 25 HYDROXY (VIT D DEFICIENCY, FRACTURES): VITD: 55.67 ng/mL (ref 30.00–100.00)

## 2020-06-01 MED ORDER — NITROGLYCERIN 0.2 MG/HR TD PT24: MEDICATED_PATCH | TRANSDERMAL | 1 refills | Status: DC

## 2020-06-01 MED FILL — NITROGLYCERIN 0.2 MG/HR PTC: 0.2 | 88 days supply | Qty: 22 | Fill #0

## 2020-06-01 NOTE — Assessment & Plan Note (Signed)
Patient did have a fall.  Patient does have some pain over the acromioclavicular joint and does have a positive crossover test.  Patient's rotator cuff strength seems to be intact.  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  Patient is to increase activity slowly over the course the next several weeks.  Follow-up with me again 4 weeks worsening symptoms will consider formal physical therapy, injections.

## 2020-06-01 NOTE — Patient Instructions (Addendum)
Good to see you  Xray of shoulder on your way out Looks like [facility] joint  Keep hands within peripheral vision  pennsaid pinkie amount topically 2 times daily as needed.   Nitroglycerin Protocol   Apply 1/4 nitroglycerin patch to affected area daily.  Change position of patch within the affected area every 24 hours.  You may experience a headache during the first 1-2 weeks of using the patch, these should subside.  If you experience headaches after beginning nitroglycerin patch treatment, you may take your preferred over the counter pain reliever.  Another side effect of the nitroglycerin patch is skin irritation or rash related to patch adhesive.  Please notify our office if you develop more severe headaches or rash, and stop the patch.  Tendon healing with nitroglycerin patch may require 12 to 24 weeks depending on the extent of injury.  Men should not use if taking Viagra, Cialis, or Levitra.   Do not use if you have migraines or rosacea.  Boot at work  Heel lift in shoe or even in the boot to help  Ice 10 minutes 2 times daily. Usually after activity and before bed.   See me again in 4 weeks

## 2020-06-01 NOTE — Progress Notes (Signed)
[name] [place] Phone: [phone] Subjective:   Sherry [name], am serving as a scribe for Dr. Hulan [name]. This visit occurred during the SARS-CoV-2 public health emergency.  Safety protocols were in place, including screening questions prior to the visit, additional usage of staff PPE, and extensive cleaning of exam room while observing appropriate contact time as indicated for disinfecting solutions.   I'm seeing this patient by the request  of:  Sherry [name], [place]  CC: Foot pain, shoulder pain both right-sided  LSL:[id number]  Sherry [name] is a 64 y.o. female coming in with complaint of right shoulder and right foot pain. Patient last seen in 03/2019 for DeQuervain's left wrist. Patient states that her R foot when she woke up Tuesday morning after working 3 12 hour shifts started hurting really bad. Using ice to help with the pain and wearing a boot. Locates the pain to the navicular bone.  R shoulder pain going on a couple months patient was bringing garbage cans back to the house and fell on her R side with arm straight up. Locates pain to back of shoulder close to the neck but originated in the shoulder joint. No numbness or tingling, no R arm weakness. Has been doing some shoulder exercises to help.        Past Medical History:  Diagnosis Date  . Deviated septum   . Hepatitis    hx hep b 1984  . Hyperlipemia   . Hypothyroidism   . PONV (postoperative nausea and vomiting)   . SCCA (squamous cell carcinoma) of skin 07/04/2017   left nose tx cx3 71fu  . Sinusitis, chronic   . Tubular adenoma 10/09/2017   No high-grade dysplasia or malignancy    Past Surgical History:  Procedure Laterality Date  . ANTERIOR CRUCIATE LIGAMENT REPAIR Right   . HAND SURGERY Left   . HERPES SIMPLEX VIRUS DFA    . KNEE SURGERY Left   . NASAL SEPTOPLASTY W/ TURBINOPLASTY  08/30/2011   Procedure: NASAL SEPTOPLASTY WITH  TURBINATE REDUCTION;  Surgeon: Sherry Belfast, MD;  Location: [place];  Service: ENT;  Laterality: N/A;  nasal septoplasty, bil. inferior turbinated reduction, bil. endoscopic concha bullosa and anterior ethmoid and maxillary antrostomy, ethmoidectomy  . NASAL SINUS SURGERY  08/30/2011   Procedure: ENDOSCOPIC SINUS SURGERY;  Surgeon: Sherry Belfast, MD;  Location: [place];  Service: ENT;  Laterality: N/A;  . TUBAL LIGATION     Social History   Socioeconomic History  . Marital status: Divorced    Spouse name: Not on file  . Number of children: Not on file  . Years of education: Not on file  . Highest education level: Not on file  Occupational History    Employer: [employer]  Tobacco Use  . Smoking status: Never Smoker  . Smokeless tobacco: Never Used  Substance and Sexual Activity  . Alcohol use: No  . Drug use: No  . Sexual activity: Not on file  Other Topics Concern  . Not on file  Social History Narrative  . Not on file   Social Determinants of Health   Financial Resource Strain:   . Difficulty of Paying Living Expenses: Not on file  Food Insecurity:   . Worried About Charity fundraiser in the Last Year: Not on file  . Ran Out of Food in the Last Year: Not on file  Transportation Needs:   . Lack  of Transportation (Medical): Not on file  . Lack of Transportation (Non-Medical): Not on file  Physical Activity:   . Days of Exercise per Week: Not on file  . Minutes of Exercise per Session: Not on file  Stress:   . Feeling of Stress : Not on file  Social Connections:   . Frequency of Communication with Friends and Family: Not on file  . Frequency of Social Gatherings with Friends and Family: Not on file  . Attends Religious Services: Not on file  . Active Member of Clubs or Organizations: Not on file  . Attends Archivist Meetings: Not on file  . Marital Status: Not on file   Allergies  Allergen Reactions  . Other  Other (See Comments)    Antiretroviral, caused jaundice   Family History  Problem Relation Age of Onset  . Leukemia Mother   . Lung cancer Father      Current Outpatient Medications (Cardiovascular):  .  nitroGLYCERIN (NITRODUR - DOSED IN MG/24 HR) 0.2 mg/hr patch, 1/4 patch daily  Current Outpatient Medications (Respiratory):  .  azelastine (ASTELIN) 0.1 % nasal spray, Place 1 spray into both nostrils 2 (two) times daily. Use in each nostril as directed .  fluticasone (FLONASE) 50 MCG/ACT nasal spray, Place 2 sprays into both nostrils daily.    Current Outpatient Medications (Other):  Marland Kitchen  Turmeric 500 MG CAPS, Take 1 capsule by mouth daily. Marland Kitchen  VITAMIN D PO, Take 10,000 Units by mouth daily. Marland Kitchen  zolpidem (AMBIEN) 10 MG tablet, Take 10 mg by mouth as needed.   Reviewed prior external information including notes and imaging from  primary care provider As well as notes that were available from care everywhere and other healthcare systems.  Past medical history, social, surgical and family history all reviewed in electronic medical record.  No pertanent information unless stated regarding to the chief complaint.   Review of Systems:  No headache, visual changes, nausea, vomiting, diarrhea, constipation, dizziness, abdominal pain, skin rash, fevers, chills, night sweats, weight loss, swollen lymph nodes, body aches, joint swelling, chest pain, shortness of breath, mood changes. POSITIVE muscle aches  Objective  Blood pressure 118/80, pulse 81, height 5\' 7"  (1.702 m), SpO2 99 %.   General: No apparent distress alert and oriented x3 mood and affect normal, dressed appropriately.  HEENT: Pupils equal, extraocular movements intact  Respiratory: Patient's speak in full sentences and does not appear short of breath  Cardiovascular: No lower extremity edema, non tender, no erythema  Right foot exam shows the patient does have some swelling over the navicular prominence on the medial aspect  of the ankle again.  Severely tender to palpation noted in the area.  Patient has some mild pain over the posterior tibialis just medial to the medial malleolus but no pain over the medial malleolus.  Right shoulder exam shows the patient does have a positive crossover.  5-5 strength noted of the rotator cuff that can tell there is some pain.  Mild impingement noted.  Neurovascularly intact distally. Limited musculoskeletal ultrasound was performed and interpreted by Lyndal Pulley  Limited ultrasound of patient's right foot shows the patient does have what appears to be a partial tear of the posterior tibialis tendon noted at the insertion on the navicular prominence.  Patient's navicular prominence does have some cortical irregularities but could not tell but appears to be more chronic in nature.  No acute fracture completely noted but difficult to assess secondary to the overlying hypoechoic  changes Impression: Partial tear of the posterior tibialis tendon with questionable irregularity of the navicular prominence   [id number]; 15 additional minutes spent for Therapeutic exercises as stated in above notes.  This included exercises focusing on stretching, strengthening, with significant focus on eccentric aspects.   Long term goals include an improvement in range of motion, strength, endurance as well as avoiding reinjury. Patient's frequency would include in 1-2 times a day, 3-5 times a week for a duration of 6-12 weeks. Shoulder Exercises that included:  Basic scapular stabilization to include adduction and depression of scapula Scaption, focusing on proper movement and good control Internal and External rotation utilizing a theraband, with elbow tucked at side entire time Rows with theraband   Proper technique shown and discussed handout in great detail with ATC.  All questions were discussed and answered.     Impression and Recommendations:     The above documentation has been reviewed and is  accurate and complete Lyndal Pulley, DO

## 2020-06-01 NOTE — Assessment & Plan Note (Signed)
Patient has had a navicular fracture previously.  Could be more of a stress reaction again but difficult to tell on the ultrasound.  Patient does have overlying hypoechoic changes that is consistent with a partial tear of the posterior tibialis at its insertion.  Discussed heel lift, or cam walker while she was at work.  We discussed the vitamin D supplementation again.  Restart the nitroglycerin patches and warned of potential side effects.  Patient is going to increase activity slowly.  Follow-up again in 4  weeks

## 2020-06-01 NOTE — Telephone Encounter (Signed)
Pt called, her work is unwilling to allow her to work in an open toed boot and are also unwilling to allow for seated work. They recommended she take a LOA. I advised her to contact [employer] to start the FMLA process. Just FYI.

## 2020-06-05 ENCOUNTER — Telehealth: Payer: Self-pay | Admitting: Family Medicine

## 2020-06-05 NOTE — Telephone Encounter (Signed)
Pt calling, she would like [place] to add to the FMLA ppwk that "this is not a work related injury".

## 2020-06-07 ENCOUNTER — Encounter: Payer: Self-pay | Admitting: Physician Assistant

## 2020-06-08 NOTE — Telephone Encounter (Signed)
Informed pt that FMLA and STD faxed today per Val.

## 2020-06-12 ENCOUNTER — Ambulatory Visit: Payer: No Typology Code available for payment source | Admitting: Physician Assistant

## 2020-06-13 ENCOUNTER — Other Ambulatory Visit: Payer: Self-pay

## 2020-06-13 ENCOUNTER — Ambulatory Visit (INDEPENDENT_AMBULATORY_CARE_PROVIDER_SITE_OTHER): Payer: No Typology Code available for payment source | Admitting: Physician Assistant

## 2020-06-13 ENCOUNTER — Other Ambulatory Visit: Payer: Self-pay | Admitting: Physician Assistant

## 2020-06-13 ENCOUNTER — Encounter: Payer: Self-pay | Admitting: Physician Assistant

## 2020-06-13 VITALS — BP 128/72 | HR 87 | Temp 97.4°F | Ht 67.0 in

## 2020-06-13 DIAGNOSIS — Z23 Encounter for immunization: Secondary | ICD-10-CM | POA: Diagnosis not present

## 2020-06-13 DIAGNOSIS — F5101 Primary insomnia: Secondary | ICD-10-CM | POA: Diagnosis not present

## 2020-06-13 DIAGNOSIS — T148XXA Other injury of unspecified body region, initial encounter: Secondary | ICD-10-CM

## 2020-06-13 DIAGNOSIS — E538 Deficiency of other specified B group vitamins: Secondary | ICD-10-CM

## 2020-06-13 MED ORDER — ZOLPIDEM TARTRATE 10 MG PO TABS: 10.0000 mg | ORAL_TABLET | ORAL | 1 refills | Status: DC | PRN

## 2020-06-13 MED FILL — ZOLPIDEM TARTRATE 10 MG TAB: 10 | 30 days supply | Qty: 30 | Fill #0

## 2020-06-13 NOTE — Progress Notes (Signed)
Sherry [name] is a 64 y.o. female here for a new problem.  I acted as a Education administrator for [employer], PA-C [employer], LPN   History of Present Illness:   Chief Complaint  Patient presents with   Bleeding/Bruising   HPI  Bruising Pt c/o 2 episodes of bleeding/ bruising on left wrist area. Both episodes were in the past two months. Denies any known trauma. History of wrist injections in the past for tenosynovitis. Denies pain, bleeding elsewhere, lightheadedness. She does not take oral ASA or blood thinners but does take daily tumeric.   B12 deficiency History of this. Would like her B12 checked today.   Insomnia Uses ambien rarely and would like refill. She mostly uses half a tablet but sometimes she takes a second 1/2 of the tablet if she wakes up in the middle of the night. Denies falls or concerning side effects with this medication.   Past Medical History:  Diagnosis Date   Deviated septum    Hepatitis    hx hep b 1984   Hyperlipemia    Hypothyroidism    PONV (postoperative nausea and vomiting)    SCCA (squamous cell carcinoma) of skin 07/04/2017   left nose tx cx3 34fu   Sinusitis, chronic    Tubular adenoma 10/09/2017   No high-grade dysplasia or malignancy      Social History   Tobacco Use   Smoking status: Never Smoker   Smokeless tobacco: Never Used  Substance Use Topics   Alcohol use: No   Drug use: No    Past Surgical History:  Procedure Laterality Date   ANTERIOR CRUCIATE LIGAMENT REPAIR Right    HAND SURGERY Left    HERPES SIMPLEX VIRUS DFA     KNEE SURGERY Left    NASAL SEPTOPLASTY W/ TURBINOPLASTY  08/30/2011   Procedure: NASAL SEPTOPLASTY WITH TURBINATE REDUCTION;  Surgeon: Jerrell Belfast, MD;  Location: [place];  Service: ENT;  Laterality: N/A;  nasal septoplasty, bil. inferior turbinated reduction, bil. endoscopic concha bullosa and anterior ethmoid and maxillary antrostomy, ethmoidectomy   NASAL  SINUS SURGERY  08/30/2011   Procedure: ENDOSCOPIC SINUS SURGERY;  Surgeon: Jerrell Belfast, MD;  Location: [place];  Service: ENT;  Laterality: N/A;   TUBAL LIGATION      Family History  Problem Relation Age of Onset   Leukemia Mother    Lung cancer Father     Allergies  Allergen Reactions   Other Other (See Comments)    Antiretroviral, caused jaundice    Current Medications:   Current Outpatient Medications:    fluticasone (FLONASE) 50 MCG/ACT nasal spray, Place 2 sprays into both nostrils daily., Disp: 16 g, Rfl: 6   Milk Thistle 150 MG CAPS, , Disp: , Rfl:    nitroGLYCERIN (NITRODUR - DOSED IN MG/24 HR) 0.2 mg/hr patch, 1/4 patch daily, Disp: 30 patch, Rfl: 1   Turmeric 500 MG CAPS, Take 1 capsule by mouth daily., Disp: , Rfl:    VITAMIN D PO, Take 10,000 Units by mouth daily., Disp: , Rfl:    zolpidem (AMBIEN) 10 MG tablet, Take 1 tablet (10 mg total) by mouth as needed., Disp: 30 tablet, Rfl: 1   Review of Systems:   ROS Negative unless otherwise specified per HPI.  Vitals:   Vitals:   06/13/20 1431  BP: 128/72  Pulse: 87  Temp: (!) 97.4 F (36.3 C)  TempSrc: Temporal  SpO2: 96%  Height: 5\' 7"  (1.702 m)  Body mass index is 28.51 kg/m.  Physical Exam:   Physical Exam Vitals and nursing note reviewed.  Constitutional:      General: She is not in acute distress.    Appearance: She is well-developed. She is not ill-appearing, toxic-appearing or sickly-appearing.  Cardiovascular:     Rate and Rhythm: Normal rate and regular rhythm.     Pulses: Normal pulses.     Heart sounds: Normal heart sounds, S1 normal and S2 normal.     Comments: No LE edema Pulmonary:     Effort: Pulmonary effort is normal.     Breath sounds: Normal breath sounds.  Skin:    General: Skin is warm, dry and intact.     Comments: Small area of ecchymosis to medial L wrist  Neurological:     Mental Status: She is alert.     GCS: GCS eye subscore is 4.  GCS verbal subscore is 5. GCS motor subscore is 6.  Psychiatric:        Mood and Affect: Mood and affect normal.        Speech: Speech normal.        Behavior: Behavior normal. Behavior is cooperative.      Assessment and Plan:   Tiare was seen today for bleeding/bruising.  Diagnoses and all orders for this visit:  Vitamin B 12 deficiency Update B12 and provide recommendations on supplements as indicated. -     Vitamin B12; Future -     Vitamin B12  Bruising Suspect that this is related to minor trauma. Update CBC and CMP. No red flags on exam. Consider holding tumeric which can have anticoagulation properties. Follow-up if symptoms worsen or persist. -     CBC with Differential/Platelet; Future -     Comprehensive metabolic panel; Future -     Comprehensive metabolic panel -     CBC with Differential/Platelet  Primary insomnia Refill ambien today. She is aware of side effects of medication and knows to reach out to [place] if she has any concerns. Recommend taking lowest dose possible to manage symptoms.  Need for prophylactic vaccination against Streptococcus pneumoniae (pneumococcus) -     Pneumococcal conjugate vaccine 13-valent IM  Other orders -     zolpidem (AMBIEN) 10 MG tablet; Take 1 tablet (10 mg total) by mouth as needed.  CMA or LPN served as scribe during this visit. History, Physical, and Plan performed by medical provider. The above documentation has been reviewed and is accurate and complete.  [name], PA-C

## 2020-06-13 NOTE — Patient Instructions (Signed)
It was great to see you!  Update blood work today. Consider hold tumeric if symptoms worsen.  I have refilled your ambien.  Follow-up as needed.  Take care,  [name] PA-C

## 2020-06-14 ENCOUNTER — Other Ambulatory Visit: Payer: Self-pay | Admitting: Physician Assistant

## 2020-06-14 LAB — CBC WITH DIFFERENTIAL/PLATELET
Absolute Monocytes: 497 cells/uL (ref 200–950)
Basophils Absolute: 28 cells/uL (ref 0–200)
Basophils Relative: 0.4 %
Eosinophils Absolute: 99 cells/uL (ref 15–500)
Eosinophils Relative: 1.4 %
HCT: 38 % (ref 35.0–45.0)
Hemoglobin: 12.8 g/dL (ref 11.7–15.5)
Lymphs Abs: 1590 cells/uL (ref 850–3900)
MCH: 30 pg (ref 27.0–33.0)
MCHC: 33.7 g/dL (ref 32.0–36.0)
MCV: 89.2 fL (ref 80.0–100.0)
MPV: 12.1 fL (ref 7.5–12.5)
Monocytes Relative: 7 %
Neutro Abs: 4885 cells/uL (ref 1500–7800)
Neutrophils Relative %: 68.8 %
Platelets: 243 10*3/uL (ref 140–400)
RBC: 4.26 10*6/uL (ref 3.80–5.10)
RDW: 12.3 % (ref 11.0–15.0)
Total Lymphocyte: 22.4 %
WBC: 7.1 10*3/uL (ref 3.8–10.8)

## 2020-06-14 LAB — COMPREHENSIVE METABOLIC PANEL
AG Ratio: 1.7 (calc) (ref 1.0–2.5)
ALT: 14 U/L (ref 6–29)
AST: 16 U/L (ref 10–35)
Albumin: 4.2 g/dL (ref 3.6–5.1)
Alkaline phosphatase (APISO): 75 U/L (ref 37–153)
BUN: 12 mg/dL (ref 7–25)
CO2: 23 mmol/L (ref 20–32)
Calcium: 9.3 mg/dL (ref 8.6–10.4)
Chloride: 106 mmol/L (ref 98–110)
Creat: 0.75 mg/dL (ref 0.50–0.99)
Globulin: 2.5 g/dL (calc) (ref 1.9–3.7)
Glucose, Bld: 87 mg/dL (ref 65–99)
Potassium: 3.8 mmol/L (ref 3.5–5.3)
Sodium: 140 mmol/L (ref 135–146)
Total Bilirubin: 0.7 mg/dL (ref 0.2–1.2)
Total Protein: 6.7 g/dL (ref 6.1–8.1)

## 2020-06-14 LAB — VITAMIN B12: Vitamin B-12: 310 pg/mL (ref 200–1100)

## 2020-06-14 MED ORDER — CYANOCOBALAMIN 1000 MCG/ML IJ SOLN: 1000.0000 ug | INTRAMUSCULAR | 12 refills | Status: DC

## 2020-06-14 MED ORDER — "BD ECLIPSE SYRINGE 25G X 1"" 3 ML MISC": 3 refills | Status: DC

## 2020-06-14 MED FILL — CYANOCOBALAMIN 1,000 MCG/ML: 1000 | 30 days supply | Qty: 1 | Fill #0

## 2020-06-14 MED FILL — BD 3 ML SYRINGE 25GX1: 25G X 1" | 365 days supply | Qty: 12 | Fill #0

## 2020-06-29 ENCOUNTER — Ambulatory Visit: Payer: No Typology Code available for payment source | Admitting: Family Medicine

## 2020-06-29 ENCOUNTER — Other Ambulatory Visit: Payer: Self-pay

## 2020-06-29 ENCOUNTER — Ambulatory Visit: Payer: Self-pay

## 2020-06-29 ENCOUNTER — Encounter: Payer: Self-pay | Admitting: Family Medicine

## 2020-06-29 VITALS — BP 144/90 | HR 83 | Ht 67.0 in

## 2020-06-29 DIAGNOSIS — M79671 Pain in right foot: Secondary | ICD-10-CM | POA: Diagnosis not present

## 2020-06-29 DIAGNOSIS — M84374G Stress fracture, right foot, subsequent encounter for fracture with delayed healing: Secondary | ICD-10-CM | POA: Diagnosis not present

## 2020-06-29 NOTE — Progress Notes (Signed)
[name] [address] [place] [address] Phone: [phone] Subjective:   I Sherry [name] am serving as a Neurosurgeon for Dr. Antoine Primas.  This visit occurred during the SARS-CoV-2 public health emergency.  Safety protocols were in place, including screening questions prior to the visit, additional usage of staff PPE, and extensive cleaning of exam room while observing appropriate contact time as indicated for disinfecting solutions.   I'm seeing this patient by the request  of:  Jarold Motto, [name]  CC: Right ankle pain follow-up  [id number]   06/01/2020 Patient did have a fall.  Patient does have some pain over the acromioclavicular joint and does have a positive crossover test.  Patient's rotator cuff strength seems to be intact.  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  Patient is to increase activity slowly over the course the next several weeks.  Follow-up with me again 4 weeks worsening symptoms will consider formal physical therapy, injections.  Patient has had a navicular fracture previously.  Could be more of a stress reaction again but difficult to tell on the ultrasound.  Patient does have overlying hypoechoic changes that is consistent with a partial tear of the posterior tibialis at its insertion.  Discussed heel lift, or cam walker while she was at work.  We discussed the vitamin D supplementation again.  Restart the nitroglycerin patches and warned of potential side effects.  Patient is going to increase activity slowly.  Follow-up again in 4  weeks  06/29/2020 Sherry [name] SWFUXNATFT is a 64 y.o. female coming in with complaint of right foot pain. Patient states she is doing pretty good. Pain every so often. Believes she is making progress.  Patient would state that she is feeling 80% better.  Still has some discomfort.   Has noticed some mild discomfort more on the left side again.  Patient wants to avoid it though having any worsening symptoms.      Past Medical History:  Diagnosis Date  . Deviated septum   . Hepatitis    hx hep b 1984  . Hyperlipemia   . Hypothyroidism   . PONV (postoperative nausea and vomiting)   . SCCA (squamous cell carcinoma) of skin 07/04/2017   left nose tx cx3 81fu  . Sinusitis, chronic   . Tubular adenoma 10/09/2017   No high-grade dysplasia or malignancy    Past Surgical History:  Procedure Laterality Date  . ANTERIOR CRUCIATE LIGAMENT REPAIR Right   . HAND SURGERY Left   . HERPES SIMPLEX VIRUS DFA    . KNEE SURGERY Left   . NASAL SEPTOPLASTY W/ TURBINOPLASTY  08/30/2011   Procedure: NASAL SEPTOPLASTY WITH TURBINATE REDUCTION;  Surgeon: Osborn Coho, MD;  Location: [facility];  Service: ENT;  Laterality: N/A;  nasal septoplasty, bil. inferior turbinated reduction, bil. endoscopic concha bullosa and anterior ethmoid and maxillary antrostomy, ethmoidectomy  . NASAL SINUS SURGERY  08/30/2011   Procedure: ENDOSCOPIC SINUS SURGERY;  Surgeon: Osborn Coho, MD;  Location: [facility];  Service: ENT;  Laterality: N/A;  . TUBAL LIGATION     Social History   Socioeconomic History  . Marital status: Divorced    Spouse name: Not on file  . Number of children: Not on file  . Years of education: Not on file  . Highest education level: Not on file  Occupational History  Employer: [employer]  Tobacco Use  . Smoking status: Never Smoker  . Smokeless tobacco: Never Used  Substance and Sexual Activity  . Alcohol use: No  . Drug use: No  . Sexual activity: Not on file  Other Topics Concern  . Not on file  Social History Narrative  . Not on file   Social Determinants of Health   Financial Resource Strain: Not on file  Food Insecurity: Not on file  Transportation Needs: Not on file  Physical Activity: Not on file  Stress: Not on  file  Social Connections: Not on file   Allergies  Allergen Reactions  . Other Other (See Comments)    Antiretroviral, caused jaundice   Family History  Problem Relation Age of Onset  . Leukemia Mother   . Lung cancer Father      Current Outpatient Medications (Cardiovascular):  .  nitroGLYCERIN (NITRODUR - DOSED IN MG/24 HR) 0.2 mg/hr patch, 1/4 patch daily  Current Outpatient Medications (Respiratory):  .  fluticasone (FLONASE) 50 MCG/ACT nasal spray, Place 2 sprays into both nostrils daily.   Current Outpatient Medications (Hematological):  .  cyanocobalamin (,VITAMIN B-12,) 1000 MCG/ML injection, Inject 1 mL (1,000 mcg total) into the muscle every 30 (thirty) days.  Current Outpatient Medications (Other):  Marland Kitchen  Milk Thistle 150 MG CAPS,  .  SYRINGE-NEEDLE, DISP, 3 ML (BD ECLIPSE SYRINGE) 25G X 1" 3 ML MISC, Use to inject Vit B12 into skin once a month Intramuscular .  Turmeric 500 MG CAPS, Take 1 capsule by mouth daily. Marland Kitchen  VITAMIN D PO, Take 10,000 Units by mouth daily. Marland Kitchen  zolpidem (AMBIEN) 10 MG tablet, Take 1 tablet (10 mg total) by mouth as needed.   Reviewed prior external information including notes and imaging from  primary care provider As well as notes that were available from care everywhere and other healthcare systems.  Past medical history, social, surgical and family history all reviewed in electronic medical record.  No pertanent information unless stated regarding to the chief complaint.   Review of Systems:  No headache, visual changes, nausea, vomiting, diarrhea, constipation, dizziness, abdominal pain, skin rash, fevers, chills, night sweats, weight loss, swollen lymph nodes, body aches, joint swelling, chest pain, shortness of breath, mood changes. POSITIVE muscle aches  Objective  Blood pressure (!) 144/90, pulse 83, height 5\' 7"  (1.702 m), SpO2 100 %.   General: No apparent distress alert and oriented x3 mood and affect normal, dressed  appropriately.  HEENT: Pupils equal, extraocular movements intact  Respiratory: Patient's speak in full sentences and does not appear short of breath  Cardiovascular: No lower extremity edema, non tender, no erythema  Right ankle exam shows the patient does not have any significant swelling.  No pain over the posterior tibialis tendon.  Patient though does have a mild positive percussion over the navicular prominence.  No erythema noted in the area.  No pain on the plantar aspect of the foot still significant pes planus noted  Limited musculoskeletal ultrasound was performed and interpreted by Lyndal Pulley  Limited ultrasound of patient's right navicular prominence shows that patient does have a mild cortical irregularity noted but callus formation noted.  Mild increase in Doppler flow in neovascularization noted.  Patient's posterior tibialis tendon seems to be significantly improved with very minimal hypoechoic changes of the tendon sheath noted.  No increasing in Doppler flow of the tendon itself. Impression: Interval healing    Impression and Recommendations:     The above  documentation has been reviewed and is accurate and complete Lyndal Pulley, DO

## 2020-06-29 NOTE — Patient Instructions (Signed)
°  Try small heel lift See me again in 6 weeks

## 2020-06-29 NOTE — Assessment & Plan Note (Signed)
Patient does have the stress reaction versus the possibility of the navicular bone again.  Patient is making progress.  The posterior tibialis tendon seems to be improving.  Patient given a heel lift and continue the home exercises.  Patient was wearing a shoe today which made me feel optimistic that patient should do well.  We will continue the nitroglycerin patch.  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  Patient will follow up with me again in 4 to 6 weeks to further evaluate and hopefully release at that time

## 2020-07-04 ENCOUNTER — Telehealth: Payer: Self-pay | Admitting: Family Medicine

## 2020-07-04 NOTE — Telephone Encounter (Signed)
Duplicate/error

## 2020-07-07 MED FILL — CYANOCOBALAMIN 1,000 MCG/ML: 1000 | 30 days supply | Qty: 1 | Fill #1

## 2020-07-10 MED FILL — ZOLPIDEM TARTRATE 10 MG TAB: 10 | 30 days supply | Qty: 30 | Fill #1

## 2020-07-18 ENCOUNTER — Encounter: Payer: Self-pay | Admitting: Family Medicine

## 2020-08-08 NOTE — Progress Notes (Unsigned)
[name] [address] [place] Phone: [phone] Subjective:   I Sherry [name] am serving as a Education administrator for Dr. Hulan Saas.  This visit occurred during the SARS-CoV-2 public health emergency.  Safety protocols were in place, including screening questions prior to the visit, additional usage of staff PPE, and extensive cleaning of exam room while observing appropriate contact time as indicated for disinfecting solutions.   I'm seeing this patient by the request  of:  [name], [place]  CC: Right ankle, right knee and right hip pain  [id number]   06/29/2020 Patient does have the stress reaction versus the possibility of the navicular bone again.  Patient is making progress.  The posterior tibialis tendon seems to be improving.  Patient given a heel lift and continue the home exercises.  Patient was wearing a shoe today which made me feel optimistic that patient should do well.  We will continue the nitroglycerin patch.  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  Patient will follow up with me again in 4 to 6 weeks to further evaluate and hopefully release at that time  Update 08/09/2020 Sherry [name] [id number] is a 65 y.o. female coming in with complaint of right foot and right knee pain. Patient states the foot is doing well. Minimal pain at times. Patient states that the knee is hurting due to compensation. [place] into an animal home last year and landed on the lateral side of her knee. Patient states she limps. Knee brace helps with activity. Some pain also in the right groin.   Onset- Chronic  Location - lateral right knee  Duration-  Character- burning  Aggravating factors- standing for long periods of time  Reliving factors-  Therapies tried- Knee brace  Severity- 6/10 at its worse      Past Medical History:  Diagnosis Date  . Deviated septum   . Hepatitis    hx hep b 1984  .  Hyperlipemia   . Hypothyroidism   . PONV (postoperative nausea and vomiting)   . SCCA (squamous cell carcinoma) of skin 07/04/2017   left nose tx cx3 4fu  . Sinusitis, chronic   . Tubular adenoma 10/09/2017   No high-grade dysplasia or malignancy    Past Surgical History:  Procedure Laterality Date  . ANTERIOR CRUCIATE LIGAMENT REPAIR Right   . HAND SURGERY Left   . HERPES SIMPLEX VIRUS DFA    . KNEE SURGERY Left   . NASAL SEPTOPLASTY W/ TURBINOPLASTY  08/30/2011   Procedure: NASAL SEPTOPLASTY WITH TURBINATE REDUCTION;  Surgeon: Jerrell Belfast, MD;  Location: [facility];  Service: ENT;  Laterality: N/A;  nasal septoplasty, bil. inferior turbinated reduction, bil. endoscopic concha bullosa and anterior ethmoid and maxillary antrostomy, ethmoidectomy  . NASAL SINUS SURGERY  08/30/2011   Procedure: ENDOSCOPIC SINUS SURGERY;  Surgeon: Jerrell Belfast, MD;  Location: [facility];  Service: ENT;  Laterality: N/A;  . TUBAL LIGATION     Social History   Socioeconomic History  . Marital status: Divorced    Spouse name: Not on file  . Number of children: Not on file  . Years of education: Not on file  . Highest education level: Not on file  Occupational History    Employer: [employer]  Tobacco Use  . Smoking status: Never Smoker  . Smokeless tobacco: Never Used  Substance and Sexual Activity  . Alcohol use: No  . Drug  use: No  . Sexual activity: Not on file  Other Topics Concern  . Not on file  Social History Narrative  . Not on file   Social Determinants of Health   Financial Resource Strain: Not on file  Food Insecurity: Not on file  Transportation Needs: Not on file  Physical Activity: Not on file  Stress: Not on file  Social Connections: Not on file   Allergies  Allergen Reactions  . Other Other (See Comments)    Antiretroviral, caused jaundice   Family History  Problem Relation Age of Onset  . Leukemia Mother   . Lung cancer Father       Current Outpatient Medications (Cardiovascular):  .  nitroGLYCERIN (NITRODUR - DOSED IN MG/24 HR) 0.2 mg/hr patch, 1/4 patch daily  Current Outpatient Medications (Respiratory):  .  fluticasone (FLONASE) 50 MCG/ACT nasal spray, Place 2 sprays into both nostrils daily.   Current Outpatient Medications (Hematological):  .  cyanocobalamin (,VITAMIN B-12,) 1000 MCG/ML injection, Inject 1 mL (1,000 mcg total) into the muscle every 30 (thirty) days.  Current Outpatient Medications (Other):  Marland Kitchen  Milk Thistle 150 MG CAPS,  .  SYRINGE-NEEDLE, DISP, 3 ML (BD ECLIPSE SYRINGE) 25G X 1" 3 ML MISC, Use to inject Vit B12 into skin once a month Intramuscular .  Turmeric 500 MG CAPS, Take 1 capsule by mouth daily. Marland Kitchen  VITAMIN D PO, Take 10,000 Units by mouth daily. Marland Kitchen  zolpidem (AMBIEN) 10 MG tablet, Take 1 tablet (10 mg total) by mouth as needed.   Reviewed prior external information including notes and imaging from  primary care provider As well as notes that were available from care everywhere and other healthcare systems.  Past medical history, social, surgical and family history all reviewed in electronic medical record.  No pertanent information unless stated regarding to the chief complaint.   Review of Systems:  No headache, visual changes, nausea, vomiting, diarrhea, constipation, dizziness, abdominal pain, skin rash, fevers, chills, night sweats, weight loss, swollen lymph nodes, body aches, joint swelling, chest pain, shortness of breath, mood changes. POSITIVE muscle aches  Objective  Blood pressure 130/80, pulse 86, height 5\' 7"  (1.702 m), SpO2 99 %.   General: No apparent distress alert and oriented x3 mood and affect normal, dressed appropriately.  HEENT: Pupils equal, extraocular movements intact  Respiratory: Patient's speak in full sentences and does not appear short of breath  Cardiovascular: No lower extremity edema, non tender, no erythema  Gait very minorly antalgic MSK:  Right ankle still has some mild overpronation of the hindfoot.  Patient is minorly tender over the navicular bone but very minimal.  Patient has very minimal tenderness over the posterior tibialis.  Good range of motion of the ankle noted.  Patient's right knee though does have tenderness to palpation over the lateral joint line, lateral tracking of the patella noted.  Patient does have tenderness with a positive McMurray's full range of motion though of the knee noted.  Patient's groin has good range of motion.  No pain in the groin with internal rotation.   Limited musculoskeletal ultrasound was performed and interpreted by Lyndal Pulley  Limited ultrasound of patient's right knee shows a lateral meniscus what appears to be an acute on chronic tear.  Mild to moderate narrowing of the lateral joint space noted.  Patient does have mild narrowing of the patellofemoral joint noted.  No effusion noted. Impression: Mild to moderate arthritis with a lateral meniscus tear   Impression and  Recommendations:     The above documentation has been reviewed and is accurate and complete Lyndal Pulley, DO

## 2020-08-09 ENCOUNTER — Other Ambulatory Visit: Payer: Self-pay

## 2020-08-09 ENCOUNTER — Ambulatory Visit (INDEPENDENT_AMBULATORY_CARE_PROVIDER_SITE_OTHER): Payer: Medicare Other

## 2020-08-09 ENCOUNTER — Ambulatory Visit: Payer: Self-pay

## 2020-08-09 ENCOUNTER — Ambulatory Visit: Payer: Medicare Other | Admitting: Family Medicine

## 2020-08-09 ENCOUNTER — Encounter: Payer: Self-pay | Admitting: Family Medicine

## 2020-08-09 VITALS — BP 130/80 | HR 86 | Ht 67.0 in

## 2020-08-09 DIAGNOSIS — M25551 Pain in right hip: Secondary | ICD-10-CM

## 2020-08-09 DIAGNOSIS — M84374G Stress fracture, right foot, subsequent encounter for fracture with delayed healing: Secondary | ICD-10-CM

## 2020-08-09 DIAGNOSIS — S83281A Other tear of lateral meniscus, current injury, right knee, initial encounter: Secondary | ICD-10-CM | POA: Insufficient documentation

## 2020-08-09 DIAGNOSIS — M25561 Pain in right knee: Secondary | ICD-10-CM

## 2020-08-09 DIAGNOSIS — G8929 Other chronic pain: Secondary | ICD-10-CM

## 2020-08-09 NOTE — Patient Instructions (Addendum)
Good to see you Right knee and hip xray Arnica lotion for bruising Lateral meniscus tear exercises for the knee Wear the brace if it helps Decrease tumeric to 3 times a week See me again in 6 weeks

## 2020-08-09 NOTE — Assessment & Plan Note (Signed)
Patient on ultrasound seems to be doing relatively well he does have what appears to be a very mildly displaced lateral meniscus.  Discussed which activities to do which wants to avoid.  Patient does have a brace if necessary, discussed topical anti-inflammatories, home exercises given.  Follow-up again in 4 to 8 weeks worsening pain consider formal physical therapy and injections.  X-rays pending

## 2020-08-09 NOTE — Assessment & Plan Note (Signed)
Seems to be doing much better at this time.  No need to change management.  Continue the good shoes, seems to do better with a little bit of the heel lift as well for the foot and ankle.

## 2020-08-10 ENCOUNTER — Ambulatory Visit: Payer: No Typology Code available for payment source | Admitting: Family Medicine

## 2020-08-14 ENCOUNTER — Other Ambulatory Visit: Payer: Self-pay | Admitting: Physician Assistant

## 2020-08-14 MED FILL — CYANOCOBALAMIN 1,000 MCG/ML: 1000 | 30 days supply | Qty: 1 | Fill #2

## 2020-08-15 ENCOUNTER — Other Ambulatory Visit: Payer: Self-pay | Admitting: Physician Assistant

## 2020-08-15 MED FILL — ZOLPIDEM TARTRATE 10 MG TAB: 10 | 30 days supply | Qty: 30 | Fill #0

## 2020-08-30 NOTE — Progress Notes (Signed)
[name] Phone: [phone] Subjective:   Sherry Sherry [name], am serving as a scribe for Dr. Hulan Saas. This visit occurred during the SARS-CoV-2 public health emergency.  Safety protocols were in place, including screening questions prior to the visit, additional usage of staff PPE, and extensive cleaning of exam room while observing appropriate contact time as indicated for disinfecting solutions.   I'm seeing this patient by the request  of:  Sherry Sherry [name], [place]  CC: Foot pain follow-up, knee pain follow-up  [id number]  Sherry Sherry [name] is a 65 y.o. female coming in with complaint of knee pain.  Patient was last seen and was diagnosed with more of mild to moderate arthritic changes and a lateral meniscal tear.  Patient was to do conservative therapy.  X-rays of the knee shows very mild arthritic changes but Sherry [name] other significant abnormality. Exercises for knee are helping to reduce her pain.  Patient states that she injured her right foot on Sunday. Pain is over medial aspect and increases with weight bearing. Is wearing cam walker today and has been icing at home.  All on the medial aspect of the foot again.  Did have swelling initially.  Still has some and is still not getting as much better.  This did happen 4 days ago when patient was in the parking garage on her way to work    Past Medical History:  Diagnosis Date  . Deviated septum   . Hepatitis    hx hep b 1984  . Hyperlipemia   . Hypothyroidism   . PONV (postoperative nausea and vomiting)   . SCCA (squamous cell carcinoma) of skin 07/04/2017   left nose tx cx3 34fu  . Sinusitis, chronic   . Tubular adenoma 10/09/2017   Sherry [name] high-grade dysplasia or malignancy    Past Surgical History:  Procedure Laterality Date  . ANTERIOR CRUCIATE LIGAMENT REPAIR Right   . HAND SURGERY Left   . HERPES SIMPLEX VIRUS DFA    . KNEE SURGERY Left   . NASAL  SEPTOPLASTY W/ TURBINOPLASTY  08/30/2011   Procedure: NASAL SEPTOPLASTY WITH TURBINATE REDUCTION;  Surgeon: Jerrell Belfast, MD;  Location: [facility];  Service: ENT;  Laterality: N/A;  nasal septoplasty, bil. inferior turbinated reduction, bil. endoscopic concha bullosa and anterior ethmoid and maxillary antrostomy, ethmoidectomy  . NASAL SINUS SURGERY  08/30/2011   Procedure: ENDOSCOPIC SINUS SURGERY;  Surgeon: Jerrell Belfast, MD;  Location: [facility];  Service: ENT;  Laterality: N/A;  . TUBAL LIGATION     Social History   Socioeconomic History  . Marital status: Divorced    Spouse name: Not on file  . Number of children: Not on file  . Years of education: Not on file  . Highest education level: Not on file  Occupational History    Employer: [employer] Sherry [name]  Tobacco Use  . Smoking status: Never Smoker  . Smokeless tobacco: Never Used  Substance and Sexual Activity  . Alcohol use: Sherry [name]  . Drug use: Sherry [name]  . Sexual activity: Not on file  Other Topics Concern  . Not on file  Social History Narrative  . Not on file   Social Determinants of Health   Financial Resource Strain: Not on file  Food Insecurity: Not on file  Transportation Needs: Not on file  Physical Activity: Not on file  Stress: Not on file  Social Connections: Not on file   Allergies  Allergen  Reactions  . Other Other (See Comments)    Antiretroviral, caused jaundice   Family History  Problem Relation Age of Onset  . Leukemia Mother   . Lung cancer Father      Current Outpatient Medications (Cardiovascular):  .  nitroGLYCERIN (NITRODUR - DOSED IN MG/24 HR) 0.2 mg/hr patch, 1/4 patch daily  Current Outpatient Medications (Respiratory):  .  fluticasone (FLONASE) 50 MCG/ACT nasal spray, Place 2 sprays into both nostrils daily.   Current Outpatient Medications (Hematological):  .  cyanocobalamin (,VITAMIN B-12,) 1000 MCG/ML injection, Inject 1 mL (1,000 mcg total) into the muscle  every 30 (thirty) days.  Current Outpatient Medications (Other):  Marland Kitchen  Milk Thistle 150 MG CAPS,  .  SYRINGE-NEEDLE, DISP, 3 ML (BD ECLIPSE SYRINGE) 25G X 1" 3 ML MISC, Use to inject Vit B12 into skin once a month Intramuscular .  Turmeric 500 MG CAPS, Take 1 capsule by mouth daily. Marland Kitchen  VITAMIN D PO, Take 10,000 Units by mouth daily. Marland Kitchen  zolpidem (AMBIEN) 10 MG tablet, TAKE 1 TABLET BY MOUTH DAILY AS NEEDED   Reviewed prior external information including notes and imaging from  primary care provider As well as notes that were available from care everywhere and other healthcare systems.  Past medical history, social, surgical and family history all reviewed in electronic medical record.  Sherry [name] pertanent information unless stated regarding to the chief complaint.   Review of Systems:  Sherry [name] headache, visual changes, nausea, vomiting, diarrhea, constipation, dizziness, abdominal pain, skin rash, fevers, chills, night sweats, weight loss, swollen lymph nodes, body aches, joint swelling, chest pain, shortness of breath, mood changes. POSITIVE muscle aches  Objective  Blood pressure (!) 124/92, pulse 96, height 5\' 7"  (1.702 m), weight 182 lb (82.6 kg), SpO2 99 %.   General: Sherry [name] apparent distress alert and oriented x3 mood and affect normal, dressed appropriately.  HEENT: Pupils equal, extraocular movements intact  Respiratory: Patient's speak in full sentences and does not appear short of breath  Cardiovascular: Trace lower extremity edema right ankle,  Gait antalgic Right ankle does show erythema noted over the medial aspect near the navicular bone and some swelling noted of the medial aspect of the ankle.  Sherry [name] pain over the distal malleolus but patient does have pain over the posterior tibialis tendon.  Severely tender near the insertion on the navicular prominence.  Sherry [name] calf pain noted.  Limited musculoskeletal ultrasound was performed and interpreted by Lyndal Pulley  Limited ultrasound of patient's  right ankle on the medial aspect near the navicular prominence shows the patient does have what appears to be some increasing in Doppler flow over the posterior tibialis tendon.  This does seem to be intersubstance tearing with hypoechoic changes noted.  Sherry [name] significant cortical irregularity noted of the navicular prominence.  Patient does have hypoechoic changes within the tendon sheath and some hyperechoic changes within the tendon more proximal near the medial malleolus. Impression: Posterior tibialis tendon tear   Impression and Recommendations:     The above documentation has been reviewed and is accurate and complete Lyndal Pulley, DO

## 2020-08-31 ENCOUNTER — Ambulatory Visit: Payer: Medicare Other | Admitting: Family Medicine

## 2020-08-31 ENCOUNTER — Encounter: Payer: Self-pay | Admitting: Family Medicine

## 2020-08-31 ENCOUNTER — Ambulatory Visit: Payer: Self-pay

## 2020-08-31 ENCOUNTER — Ambulatory Visit (INDEPENDENT_AMBULATORY_CARE_PROVIDER_SITE_OTHER): Payer: Medicare Other

## 2020-08-31 ENCOUNTER — Other Ambulatory Visit: Payer: Self-pay

## 2020-08-31 VITALS — BP 124/92 | HR 96 | Ht 67.0 in | Wt 182.0 lb

## 2020-08-31 DIAGNOSIS — M25571 Pain in right ankle and joints of right foot: Secondary | ICD-10-CM

## 2020-08-31 DIAGNOSIS — M25561 Pain in right knee: Secondary | ICD-10-CM

## 2020-08-31 DIAGNOSIS — G8929 Other chronic pain: Secondary | ICD-10-CM

## 2020-08-31 DIAGNOSIS — M76821 Posterior tibial tendinitis, right leg: Secondary | ICD-10-CM

## 2020-08-31 NOTE — Assessment & Plan Note (Signed)
Right-sided with potential partial tear noted distally.  Patient already has the cam walker and is starting the nitroglycerin patches again.  Patient has responded to this previously.  We will consider the possible need for formal physical therapy but at the moment we will start with just range of motion exercises.  X-rays ordered today.  We did discuss with patient again about the potential for bone density which patient declined.  Patient will follow up with me again in 3 weeks.  Given a note for work to allow her to work wearing the cam walker.  Handicap paperwork filled out today as well.

## 2020-08-31 NOTE — Patient Instructions (Addendum)
Xray today Cam walker with walking until we see you again

## 2020-09-19 NOTE — Progress Notes (Unsigned)
[place] [address] [place] Phone: [phone] Subjective:   I Sherry [name] am serving as a Education administrator for Dr. Hulan Saas.  This visit occurred during the SARS-CoV-2 public health emergency.  Safety protocols were in place, including screening questions prior to the visit, additional usage of staff PPE, and extensive cleaning of exam room while observing appropriate contact time as indicated for disinfecting solutions.   I'm seeing this patient by the request  of:  Sherry [name], [place]  CC: Right-sided ankle pain  [name]   08/31/2020 Right-sided with potential partial tear noted distally.  Patient already has the cam walker and is starting the nitroglycerin patches again.  Patient has responded to this previously.  We Sherry [name] consider the possible need for formal physical therapy but at the moment we Sherry [name] start with just range of motion exercises.  X-rays ordered today.  We did discuss with patient again about the potential for bone density which patient declined.  Patient Sherry [name] follow up with me again in 3 weeks.  Given a note for work to allow her to work wearing the cam walker.  Handicap paperwork filled out today as well.  Update 09/20/2020 Sherry [name] [id number] is a 65 y.o. female coming in with complaint of right knee and right ankle pain. Negative ankle xray 08/31/2020. Patient states she is feeling better. States she is not at 100% yet. Wearing the boot consistently.  Patient feels like she is feeling significantly better now.  Patient is still concerned about coming out of the boot at this time.  Has not noticed any swelling.  No new symptoms.  Patient does state that she had one episode where she had some numbness in her left hand.  Has had left wrist pain previously.  Patient states that she changed positions and it seemed to get better.  Denies any weakness.  States that the numbness was in her thumb and index finger. Denies any  chest pain, any arm pain, any shortness of breath.      Past Medical History:  Diagnosis Date  . Deviated septum   . Hepatitis    hx hep b 1984  . Hyperlipemia   . Hypothyroidism   . PONV (postoperative nausea and vomiting)   . SCCA (squamous cell carcinoma) of skin 07/04/2017   left nose tx cx3 64fu  . Sinusitis, chronic   . Tubular adenoma 10/09/2017   No high-grade dysplasia or malignancy    Past Surgical History:  Procedure Laterality Date  . ANTERIOR CRUCIATE LIGAMENT REPAIR Right   . HAND SURGERY Left   . HERPES SIMPLEX VIRUS DFA    . KNEE SURGERY Left   . NASAL SEPTOPLASTY W/ TURBINOPLASTY  08/30/2011   Procedure: NASAL SEPTOPLASTY WITH TURBINATE REDUCTION;  Surgeon: Jerrell Belfast, MD;  Location: [facility];  Service: ENT;  Laterality: N/A;  nasal septoplasty, bil. inferior turbinated reduction, bil. endoscopic concha bullosa and anterior ethmoid and maxillary antrostomy, ethmoidectomy  . NASAL SINUS SURGERY  08/30/2011   Procedure: ENDOSCOPIC SINUS SURGERY;  Surgeon: Jerrell Belfast, MD;  Location: [facility];  Service: ENT;  Laterality: N/A;  . TUBAL LIGATION     Social History   Socioeconomic History  . Marital status: Divorced    Spouse name: Not on file  . Number of children: Not on file  . Years of education: Not on file  . Highest education level: Not on file  Occupational History    Employer:  [employer]  Tobacco Use  . Smoking status: Never Smoker  . Smokeless tobacco: Never Used  Substance and Sexual Activity  . Alcohol use: No  . Drug use: No  . Sexual activity: Not on file  Other Topics Concern  . Not on file  Social History Narrative  . Not on file   Social Determinants of Health   Financial Resource Strain: Not on file  Food Insecurity: Not on file  Transportation Needs: Not on file  Physical Activity: Not on file  Stress: Not on file  Social Connections: Not on file   Allergies  Allergen Reactions  .  Other Other (See Comments)    Antiretroviral, caused jaundice   Family History  Problem Relation Age of Onset  . Leukemia Mother   . Lung cancer Father      Current Outpatient Medications (Cardiovascular):  .  nitroGLYCERIN (NITRODUR - DOSED IN MG/24 HR) 0.2 mg/hr patch, 1/4 patch daily  Current Outpatient Medications (Respiratory):  .  fluticasone (FLONASE) 50 MCG/ACT nasal spray, Place 2 sprays into both nostrils daily.   Current Outpatient Medications (Hematological):  .  cyanocobalamin (,VITAMIN B-12,) 1000 MCG/ML injection, Inject 1 mL (1,000 mcg total) into the muscle every 30 (thirty) days.  Current Outpatient Medications (Other):  Marland Kitchen  Milk Thistle 150 MG CAPS,  .  SYRINGE-NEEDLE, DISP, 3 ML (BD ECLIPSE SYRINGE) 25G X 1" 3 ML MISC, Use to inject Vit B12 into skin once a month Intramuscular .  Turmeric 500 MG CAPS, Take 1 capsule by mouth daily. Marland Kitchen  VITAMIN D PO, Take 10,000 Units by mouth daily. Marland Kitchen  zolpidem (AMBIEN) 10 MG tablet, TAKE 1 TABLET BY MOUTH DAILY AS NEEDED   Reviewed prior external information including notes and imaging from  primary care provider As well as notes that were available from care everywhere and other healthcare systems.  Past medical history, social, surgical and family history all reviewed in electronic medical record.  No pertanent information unless stated regarding to the chief complaint.   Review of Systems:  No headache, visual changes, nausea, vomiting, diarrhea, constipation, dizziness, abdominal pain, skin rash, fevers, chills, night sweats, weight loss, swollen lymph nodes, body aches, joint swelling, chest pain, shortness of breath, mood changes. POSITIVE muscle aches  Objective  Blood pressure 120/60, pulse 76, height 5\' 7"  (1.702 m), SpO2 100 %.   General: No apparent distress alert and oriented x3 mood and affect normal, dressed appropriately.  HEENT: Pupils equal, extraocular movements intact  Respiratory: Patient's speak in  full sentences and does not appear short of breath  Cardiovascular: No lower extremity edema, non tender, no erythema  Gait \\antalgic  MSK: Patient's right ankle has significant decrease in swelling.  Very minimal tenderness at the insertion of the posterior tibialis on the navicular bone.  Patient still has some very mild tightness noted more over the medial aspect of the ankle.  Negative Tinel's.  Limited musculoskeletal ultrasound was performed and interpreted by Lyndal Pulley  Limited ultrasound of patient's right ankle shows the patient does have hypoechoic changes noted still minorly within the tendon sheath of the posterior tibialis tendon.  Significant decrease though in the Doppler flow.  Patient though near the medial aspect of the talar dome does have what appears to be questionable body.  Nontender though with compression.  Does have some mild hypoechoic changes surrounding the area. Impression: Interval improvement of the posterior tibialis tendon and navicular bone appears to be unremarkable.     Impression and  Recommendations:     The above documentation has been reviewed and is accurate and complete Lyndal Pulley, DO

## 2020-09-20 ENCOUNTER — Ambulatory Visit: Payer: Medicare Other | Admitting: Family Medicine

## 2020-09-20 ENCOUNTER — Ambulatory Visit: Payer: Self-pay

## 2020-09-20 ENCOUNTER — Encounter: Payer: Self-pay | Admitting: Family Medicine

## 2020-09-20 ENCOUNTER — Other Ambulatory Visit: Payer: Self-pay

## 2020-09-20 VITALS — BP 120/60 | HR 76 | Ht 67.0 in

## 2020-09-20 DIAGNOSIS — M25571 Pain in right ankle and joints of right foot: Secondary | ICD-10-CM | POA: Diagnosis not present

## 2020-09-20 DIAGNOSIS — M76821 Posterior tibial tendinitis, right leg: Secondary | ICD-10-CM | POA: Diagnosis not present

## 2020-09-20 DIAGNOSIS — G8929 Other chronic pain: Secondary | ICD-10-CM

## 2020-09-20 DIAGNOSIS — G5602 Carpal tunnel syndrome, left upper limb: Secondary | ICD-10-CM | POA: Diagnosis not present

## 2020-09-20 NOTE — Patient Instructions (Addendum)
Good to see you Looking much better Boot for 10 days then use as needed Continue exercises Wrist brace at night  Exercises for the wrist  See me again in 6 weeks

## 2020-09-20 NOTE — Assessment & Plan Note (Signed)
Patient with partial tear seems to be doing relatively well.  Patient does have a mild loose body that I do think we should continue to follow.  Possible MRI will be necessary if any worsening pain.  Discussed with patient icing regimen and home exercises.  Patient will start to continue with the boot when she is at work but I would like her to try to wear shoe outside of the house with a heel lift.  We discussed the home exercises which she has been doing very well.  No change in any of the other medication management and follow-up again in 6 weeks

## 2020-09-20 NOTE — Assessment & Plan Note (Signed)
I believe the patient is having more of a left-sided carpal tunnel.  We discussed bracing at night, home exercises, icing regimen.  I do not think that anything else would need to be done.  Quick ultrasound did show a bifid nerve but does not have any significant inflammation.  We will continue to monitor otherwise.

## 2020-09-22 ENCOUNTER — Other Ambulatory Visit (HOSPITAL_BASED_OUTPATIENT_CLINIC_OR_DEPARTMENT_OTHER): Payer: Self-pay

## 2020-09-26 MED FILL — ZOLPIDEM TARTRATE 10 MG TAB: 10 | 30 days supply | Qty: 30 | Fill #1

## 2020-09-26 MED FILL — CYANOCOBALAMIN 1,000 MCG/ML: 1000 | 30 days supply | Qty: 1 | Fill #3

## 2020-10-31 NOTE — Progress Notes (Signed)
[place] Phone: [phone] Subjective:   Sherry [name], am serving as a scribe for Dr. Hulan Saas. This visit occurred during the SARS-CoV-2 public health emergency.  Safety protocols were in place, including screening questions prior to the visit, additional usage of staff PPE, and extensive cleaning of exam room while observing appropriate contact time as indicated for disinfecting solutions.   I'm seeing this patient by the request  of:  [name], [place]  CC: Ankle pain follow-up  AJO:[id number]   09/20/2020 I believe the patient is having more of a left-sided carpal tunnel.  We discussed bracing at night, home exercises, icing regimen.  I do not think that anything else would need to be done.  Quick ultrasound did show a bifid nerve but does not have any significant inflammation.  We will continue to monitor otherwise.  Patient with partial tear seems to be doing relatively well.  Patient does have a mild loose body that I do think we should continue to follow.  Possible MRI will be necessary if any worsening pain.  Discussed with patient icing regimen and home exercises.  Patient will start to continue with the boot when she is at work but I would like her to try to wear shoe outside of the house with a heel lift.  We discussed the home exercises which she has been doing very well.  [name] change in any of the other medication management and follow-up again in 6 weeks  Update 11/01/2020 Sherry [name] [id number] is a 65 y.o. female coming in with complaint of R ankle and L wrist pain. Patient states that she has had improvement in the ankle and wrist. Ankle pain is still present intermittently.  Patient states that it is significantly better.  Continues to wear the cam walker at work but the patient states that she has been she is otherwise.  Has been wearing the heel lift.  Avoids being barefoot.      Past Medical  History:  Diagnosis Date  . Deviated septum   . Hepatitis    hx hep b 1984  . Hyperlipemia   . Hypothyroidism   . PONV (postoperative nausea and vomiting)   . SCCA (squamous cell carcinoma) of skin 07/04/2017   left nose tx cx3 59fu  . Sinusitis, chronic   . Tubular adenoma 10/09/2017   [name] high-grade dysplasia or malignancy    Past Surgical History:  Procedure Laterality Date  . ANTERIOR CRUCIATE LIGAMENT REPAIR Right   . HAND SURGERY Left   . HERPES SIMPLEX VIRUS DFA    . KNEE SURGERY Left   . NASAL SEPTOPLASTY W/ TURBINOPLASTY  08/30/2011   Procedure: NASAL SEPTOPLASTY WITH TURBINATE REDUCTION;  Surgeon: Jerrell Belfast, MD;  Location: [facility];  Service: ENT;  Laterality: N/A;  nasal septoplasty, bil. inferior turbinated reduction, bil. endoscopic concha bullosa and anterior ethmoid and maxillary antrostomy, ethmoidectomy  . NASAL SINUS SURGERY  08/30/2011   Procedure: ENDOSCOPIC SINUS SURGERY;  Surgeon: Jerrell Belfast, MD;  Location: [facility];  Service: ENT;  Laterality: N/A;  . TUBAL LIGATION     Social History   Socioeconomic History  . Marital status: Divorced    Spouse name: Not on file  . Number of children: Not on file  . Years of education: Not on file  . Highest education level: Not on file  Occupational History    Employer: [employer]  Tobacco Use  .  Smoking status: Never Smoker  . Smokeless tobacco: Never Used  Substance and Sexual Activity  . Alcohol use: [name]  . Drug use: [name]  . Sexual activity: Not on file  Other Topics Concern  . Not on file  Social History Narrative  . Not on file   Social Determinants of Health   Financial Resource Strain: Not on file  Food Insecurity: Not on file  Transportation Needs: Not on file  Physical Activity: Not on file  Stress: Not on file  Social Connections: Not on file   Allergies  Allergen Reactions  . Other Other (See Comments)    Antiretroviral, caused jaundice   Family  History  Problem Relation Age of Onset  . Leukemia Mother   . Lung cancer Father      Current Outpatient Medications (Cardiovascular):  .  nitroGLYCERIN (NITRODUR - DOSED IN MG/24 HR) 0.2 mg/hr patch, PLACE 1/4 PATCH ONCE A DAY  Current Outpatient Medications (Respiratory):  .  fluticasone (FLONASE) 50 MCG/ACT nasal spray, Place 2 sprays into both nostrils daily.   Current Outpatient Medications (Hematological):  .  cyanocobalamin (,VITAMIN B-12,) 1000 MCG/ML injection, INJECT 1 ML INTO THE MUSCLE EVERY 30 DAYS  Current Outpatient Medications (Other):  Marland Kitchen  Milk Thistle 150 MG CAPS,  .  SYRINGE-NEEDLE, DISP, 3 ML 25G X 1" 3 ML MISC, USE TO INJECT VITAMIN B12 INTO THE MUSCLE ONCE A MONTH .  Turmeric 500 MG CAPS, Take 1 capsule by mouth daily. Marland Kitchen  VITAMIN D PO, Take 10,000 Units by mouth daily. Marland Kitchen  zolpidem (AMBIEN) 10 MG tablet, TAKE 1 TABLET BY MOUTH DAILY AS NEEDED   Reviewed prior external information including notes and imaging from  primary care provider As well as notes that were available from care everywhere and other healthcare systems.  Past medical history, social, surgical and family history all reviewed in electronic medical record.  [name] pertanent information unless stated regarding to the chief complaint.   Review of Systems:  [name] headache, visual changes, nausea, vomiting, diarrhea, constipation, dizziness, abdominal pain, skin rash, fevers, chills, night sweats, weight loss, swollen lymph nodes, body aches, joint swelling, chest pain, shortness of breath, mood changes. POSITIVE muscle aches  Objective  Blood pressure 122/72, pulse 77, height 5\' 7"  (1.702 m), SpO2 98 %.   General: [name] apparent distress alert and oriented x3 mood and affect normal, dressed appropriately.  Patient is wearing an excellent bronco shirt HEENT: Pupils equal, extraocular movements intact  Respiratory: Patient's speak in full sentences and does not appear short of breath  Cardiovascular: [name]  lower extremity edema, non tender, [name] erythema  Gait antalgic  Patient does have some mild tenderness still over the medial aspect of the right ankle.  [name] significant swelling noted.  Very minimal discomfort over the navicular prominence.  Limited musculoskeletal ultrasound was performed and interpreted by Lyndal Pulley  Limited ultrasound of patient's posterior tibialis tendon has significant decrease in hypoechoic changes.  [name] longer seeing the cyst formation noted at this time.  Patient's navicular bone appears to have a well-healed callus formation over the bone at this point.  [name] increase in any type of neovascularization or increased Doppler flow is noted posterior tibialis tendon at all at this time. Impression: Interval improvement    Impression and Recommendations:     The above documentation has been reviewed and is accurate and complete Lyndal Pulley, DO

## 2020-11-01 ENCOUNTER — Other Ambulatory Visit: Payer: Self-pay

## 2020-11-01 ENCOUNTER — Ambulatory Visit: Payer: Medicare Other | Admitting: Family Medicine

## 2020-11-01 ENCOUNTER — Ambulatory Visit: Payer: Self-pay

## 2020-11-01 ENCOUNTER — Encounter: Payer: Self-pay | Admitting: Family Medicine

## 2020-11-01 VITALS — BP 122/72 | HR 77 | Ht 67.0 in

## 2020-11-01 DIAGNOSIS — M76821 Posterior tibial tendinitis, right leg: Secondary | ICD-10-CM | POA: Diagnosis not present

## 2020-11-01 DIAGNOSIS — M25571 Pain in right ankle and joints of right foot: Secondary | ICD-10-CM | POA: Diagnosis not present

## 2020-11-01 DIAGNOSIS — G8929 Other chronic pain: Secondary | ICD-10-CM | POA: Diagnosis not present

## 2020-11-01 NOTE — Assessment & Plan Note (Signed)
Significant improvement at this time.  Discussed icing regimen and home exercises, discussed which activities to doing which wants to avoid.  Patient can follow-up as needed.

## 2020-11-01 NOTE — Patient Instructions (Signed)
Love the shirt Ankle looks better Cam walker at work if you need it for next 2 weeks See me in 8 weeks

## 2020-12-25 ENCOUNTER — Ambulatory Visit: Payer: Medicare Other | Admitting: Family Medicine

## 2020-12-26 ENCOUNTER — Ambulatory Visit: Payer: Medicare Other | Admitting: Family Medicine

## 2021-01-20 ENCOUNTER — Telehealth: Payer: Self-pay | Admitting: Advanced Practice Midwife

## 2021-01-20 DIAGNOSIS — S40861A Insect bite (nonvenomous) of right upper arm, initial encounter: Secondary | ICD-10-CM

## 2021-01-20 DIAGNOSIS — L2389 Allergic contact dermatitis due to other agents: Secondary | ICD-10-CM

## 2021-01-20 MED ORDER — TRIAMCINOLONE ACETONIDE 0.5 % EX OINT: 1.0000 "application " | TOPICAL_OINTMENT | Freq: Two times a day (BID) | CUTANEOUS | 0 refills | Status: DC

## 2021-01-20 NOTE — Telephone Encounter (Signed)
Pt presented with insect bite 2 days ago that worsened into an itchy rash on right inner arm/elbow area. She is using topical Benadryl cream and hydrocortisone OTC cream with minimal improvement.  There is no swelling or pain.    Rx for triamcinolone ointment to use BID x 7 days.    F/U with PCP or Urgent care if not improved.

## 2021-01-30 ENCOUNTER — Other Ambulatory Visit (HOSPITAL_COMMUNITY): Payer: Self-pay

## 2021-01-30 MED ORDER — CHLORHEXIDINE GLUCONATE 0.12 % MT SOLN
OROMUCOSAL | 0 refills | Status: DC
  Filled 2021-01-30: qty 473, 15d supply, fill #0

## 2021-02-21 ENCOUNTER — Other Ambulatory Visit (HOSPITAL_COMMUNITY): Payer: Self-pay

## 2021-02-23 ENCOUNTER — Ambulatory Visit (INDEPENDENT_AMBULATORY_CARE_PROVIDER_SITE_OTHER): Payer: Medicare Other | Admitting: Physician Assistant

## 2021-02-23 ENCOUNTER — Encounter: Payer: Self-pay | Admitting: Physician Assistant

## 2021-02-23 ENCOUNTER — Other Ambulatory Visit: Payer: Self-pay | Admitting: Physician Assistant

## 2021-02-23 ENCOUNTER — Other Ambulatory Visit: Payer: Self-pay

## 2021-02-23 VITALS — BP 130/78 | HR 82 | Temp 98.1°F | Ht 67.0 in | Wt 170.2 lb

## 2021-02-23 DIAGNOSIS — E538 Deficiency of other specified B group vitamins: Secondary | ICD-10-CM

## 2021-02-23 DIAGNOSIS — E669 Obesity, unspecified: Secondary | ICD-10-CM | POA: Diagnosis not present

## 2021-02-23 DIAGNOSIS — R5383 Other fatigue: Secondary | ICD-10-CM

## 2021-02-23 DIAGNOSIS — Z136 Encounter for screening for cardiovascular disorders: Secondary | ICD-10-CM

## 2021-02-23 DIAGNOSIS — Z13220 Encounter for screening for lipoid disorders: Secondary | ICD-10-CM | POA: Diagnosis not present

## 2021-02-23 DIAGNOSIS — L819 Disorder of pigmentation, unspecified: Secondary | ICD-10-CM

## 2021-02-23 LAB — CBC WITH DIFFERENTIAL/PLATELET
Basophils Absolute: 0 10*3/uL (ref 0.0–0.1)
Basophils Relative: 0.7 % (ref 0.0–3.0)
Eosinophils Absolute: 0.1 10*3/uL (ref 0.0–0.7)
Eosinophils Relative: 1.5 % (ref 0.0–5.0)
HCT: 37.9 % (ref 36.0–46.0)
Hemoglobin: 12.6 g/dL (ref 12.0–15.0)
Lymphocytes Relative: 31.3 % (ref 12.0–46.0)
Lymphs Abs: 1.4 10*3/uL (ref 0.7–4.0)
MCHC: 33.1 g/dL (ref 30.0–36.0)
MCV: 89.7 fl (ref 78.0–100.0)
Monocytes Absolute: 0.3 10*3/uL (ref 0.1–1.0)
Monocytes Relative: 6.2 % (ref 3.0–12.0)
Neutro Abs: 2.6 10*3/uL (ref 1.4–7.7)
Neutrophils Relative %: 60.3 % (ref 43.0–77.0)
Platelets: 208 10*3/uL (ref 150.0–400.0)
RBC: 4.23 Mil/uL (ref 3.87–5.11)
RDW: 12.7 % (ref 11.5–15.5)
WBC: 4.4 10*3/uL (ref 4.0–10.5)

## 2021-02-23 LAB — COMPREHENSIVE METABOLIC PANEL
ALT: 14 U/L (ref 0–35)
AST: 17 U/L (ref 0–37)
Albumin: 4.3 g/dL (ref 3.5–5.2)
Alkaline Phosphatase: 65 U/L (ref 39–117)
BUN: 14 mg/dL (ref 6–23)
CO2: 24 mEq/L (ref 19–32)
Calcium: 9.6 mg/dL (ref 8.4–10.5)
Chloride: 106 mEq/L (ref 96–112)
Creatinine, Ser: 0.76 mg/dL (ref 0.40–1.20)
GFR: 81.48 mL/min (ref >60.00)
Glucose, Bld: 89 mg/dL (ref 70–99)
Potassium: 3.5 mEq/L (ref 3.5–5.1)
Sodium: 140 mEq/L (ref 135–145)
Total Bilirubin: 0.7 mg/dL (ref 0.2–1.2)
Total Protein: 7.1 g/dL (ref 6.0–8.3)

## 2021-02-23 LAB — TSH: TSH: 2.8 u[IU]/mL (ref 0.35–5.50)

## 2021-02-23 LAB — LIPID PANEL
Cholesterol: 235 mg/dL — ABNORMAL HIGH (ref 0–200)
HDL: 68 mg/dL (ref >39.00)
LDL Cholesterol: 150 mg/dL — ABNORMAL HIGH (ref 0–99)
NonHDL: 167.05
Total CHOL/HDL Ratio: 3
Triglycerides: 85 mg/dL (ref 0.0–149.0)
VLDL: 17 mg/dL (ref 0.0–40.0)

## 2021-02-23 LAB — VITAMIN B12: Vitamin B-12: 379 pg/mL (ref 211–911)

## 2021-02-23 NOTE — Patient Instructions (Signed)
It was great to see you!  The dermatologist you saw was Dr. Lavonna Monarch -- I will put in referral  Update blood work today  Start weekly Ozempic 0.25 mg injection. (This is the same medication branded as Mancel Parsons that we use for weight loss.) Let me know if you'd like to continue this.  Take care,  [name] PA-C

## 2021-02-23 NOTE — Progress Notes (Signed)
Sherry [name] [id number] is a 65 y.o. female is here to discuss weight loss medication.  I acted as a Education administrator for [employer], PA-C [employer], LPN   History of Present Illness:   Chief Complaint  Patient presents with   Weight Loss     HPI   Obesity Pt wants to discuss how to lose weight. She has been trying to eat healthy. Exercise is limited due to ongoing orthopedic issues in legs. Has not tried medication before and would like to see if that's helpful for her.   Lipid screening Would like her cholesterol checked. Last checked 2 years ago and LDL 167, does not take medication.   Vit B12 deficiency Hx of B12 deficiency in the past requiring B12 injections. Has not had injections in several months. Would like labs updated. Feeling fatigued, see below.   Fatigue Feels like her "battery is dead." She is drinking plenty of water. Limited exercise due to orthopedic issues. Has had COVID in the past and does not feel like this fatigue is related to that. Denies: chest pain, SOB, LE swelling. She reports that she had a sleep study several years ago and was not told that she had sleep apnea. She does not suspect this.  She does have a history of chronic hepatitis, and does get ultrasounds of her liver every 6 months -- I do not have these ready to review.    Health Maintenance Due  Topic Date Due   Zoster Vaccines- Shingrix (1 of 2) Never done   MAMMOGRAM  01/31/2020   COVID-19 Vaccine (4 - Booster for [employer] series) 06/10/2020    Past Medical History:  Diagnosis Date   Deviated septum    Hepatitis    hx hep b 1984   Hyperlipemia    Hypothyroidism    PONV (postoperative nausea and vomiting)    SCCA (squamous cell carcinoma) of skin 07/04/2017   left nose tx cx3 31f   Sinusitis, chronic    Tubular adenoma 10/09/2017   No high-grade dysplasia or malignancy      Social History   Tobacco Use   Smoking status: Never   Smokeless tobacco: Never  Substance Use  Topics   Alcohol use: No   Drug use: No    Past Surgical History:  Procedure Laterality Date   ANTERIOR CRUCIATE LIGAMENT REPAIR Right    HAND SURGERY Left    HERPES SIMPLEX VIRUS DFA     KNEE SURGERY Left    NASAL SEPTOPLASTY W/ TURBINOPLASTY  08/30/2011   Procedure: NASAL SEPTOPLASTY WITH TURBINATE REDUCTION;  Surgeon: DJerrell Belfast MD;  [place] [facility]  Service: ENT;  Laterality: N/A;  nasal septoplasty, bil. inferior turbinated reduction, bil. endoscopic concha bullosa and anterior ethmoid and maxillary antrostomy, ethmoidectomy   NASAL SINUS SURGERY  08/30/2011   Procedure: ENDOSCOPIC SINUS SURGERY;  Surgeon: DJerrell Belfast MD;  [place] [facility]  Service: ENT;  Laterality: N/A;   TUBAL LIGATION      Family History  Problem Relation Age of Onset   Leukemia Mother    Lung cancer Father     PMHx, SurgHx, SocialHx, FamHx, Medications, and Allergies were reviewed in the Visit Navigator and updated as appropriate.   Patient Active Problem List   Diagnosis Date Noted   Acute lateral meniscus tear of right knee 08/09/2020   AC joint pain 06/01/2020   Carpal tunnel syndrome, left 12/14/2018   Degenerative arthritis of left knee 12/14/2018   Nonallopathic  lesion of sacral region 02/17/2018   Fecal incontinence 01/21/2018   Tubular adenoma 10/09/2017   Generalized hypermobility of joints 09/22/2017   PTSD (post-traumatic stress disorder) 09/22/2017   Chronic hepatitis, needs q 6 month liver US 09/19/2017   Stress fracture, right foot, subsequent encounter for fracture with delayed healing 09/19/2017   Acid reflux 09/19/2017   Dysphagia 09/19/2017   Arthritis 09/19/2017   IBS (irritable bowel syndrome) 09/19/2017   Vitamin D deficiency 09/19/2017   Plantar fasciitis 09/19/2017   Tear of deltoid ligament of ankle, left, initial encounter 07/08/2017   Tension type headache, prn Fioricet 06/18/2017   Lumbar radiculopathy, right 08/19/2016    Trigger point of right shoulder region 04/22/2016   Slipped rib syndrome 04/22/2016   Nonallopathic lesion of rib cage 04/22/2016   Nonallopathic lesion of cervical region 04/22/2016   Nonallopathic lesion of lumbosacral region 04/22/2016   Nonallopathic lesion of thoracic region 04/22/2016   Piriformis syndrome of right side 04/22/2016   Osteopenia 11/01/2015   Left navicular fracture of foot 10/11/2015   High risk medication use 09/20/2014   Tibialis posterior tendinitis 12/14/2013   De Quervain's tenosynovitis, left 12/14/2013   Sinusitis, chronic 08/30/2011   Deviated nasal septum 08/30/2011   Allergic rhinitis 06/08/2007   Insomnia, psychophsiologic 06/08/2007    Social History   Tobacco Use   Smoking status: Never   Smokeless tobacco: Never  Substance Use Topics   Alcohol use: No   Drug use: No    Current Medications and Allergies:    Current Outpatient Medications:    fluticasone (FLONASE) 50 MCG/ACT nasal spray, Place 2 sprays into both nostrils daily., Disp: 16 g, Rfl: 6   MURO 128 5 % ophthalmic ointment, at bedtime., Disp: , Rfl:    Turmeric 500 MG CAPS, Take 1 capsule by mouth daily., Disp: , Rfl:    VITAMIN D PO, Take 10,000 Units by mouth daily., Disp: , Rfl:    zolpidem (AMBIEN) 10 MG tablet, TAKE 1 TABLET BY MOUTH DAILY AS NEEDED, Disp: 30 tablet, Rfl: 1   Allergies  Allergen Reactions   Other Other (See Comments)    Antiretroviral, caused jaundice    Review of Systems   ROS Negative unless otherwise specified per HPI.  Vitals:   Vitals:   02/23/21 1105  BP: 130/78  Pulse: 82  Temp: 98.1 F (36.7 C)  TempSrc: Temporal  SpO2: 98%  Weight: 170 lb 4 oz (77.2 kg)  Height: '5\' 7"'$  (1.702 m)     Body mass index is 26.66 kg/m.   Physical Exam:    Physical Exam Vitals and nursing note reviewed.  Constitutional:      General: She is not in acute distress.    Appearance: She is well-developed. She is not ill-appearing or toxic-appearing.   Cardiovascular:     Rate and Rhythm: Normal rate and regular rhythm.     Pulses: Normal pulses.     Heart sounds: Normal heart sounds, S1 normal and S2 normal.     Comments: No LE edema Pulmonary:     Effort: Pulmonary effort is normal.     Breath sounds: Normal breath sounds.  Skin:    General: Skin is warm and dry.  Neurological:     Mental Status: She is alert.     GCS: GCS eye subscore is 4. GCS verbal subscore is 5. GCS motor subscore is 6.  Psychiatric:        Speech: Speech normal.  Behavior: Behavior normal. Behavior is cooperative.     Assessment and Plan:    Cherlynn was seen today for weight loss.  Diagnoses and all orders for this visit:  Vitamin B 12 deficiency Update vitamin B12 level today Provide recommendations accordingly -     Vitamin B12  Fatigue, unspecified type Unclear etiology We will update blood work to make sure this is not from an organic cause Recommend working on continued healthy lifestyle changes If no improvement, patient follow-up and we will pursue further work-up I have also reached out to patient to make sure that she is seeing a specialist regarding her not that we should get her serial ultrasound done for her -     CBC with Differential/Platelet -     Comprehensive metabolic panel -     TSH  Encounter for lipid screening for cardiovascular disease -     Lipid panel  Obesity, unspecified classification, unspecified obesity type, unspecified whether serious comorbidity present Continue to work on diet as able Recommend Ozempic, sample provided today Start Ozempic 0.25 mg weekly I asked for patient to let us know if she is wanting to have this prescribed after she has taken this for a few weeks  [name], PA-C [place], [facility] 02/23/2021  Follow-up: No follow-ups on file.

## 2021-02-24 ENCOUNTER — Encounter: Payer: Self-pay | Admitting: Physician Assistant

## 2021-02-24 DIAGNOSIS — E538 Deficiency of other specified B group vitamins: Secondary | ICD-10-CM

## 2021-02-27 ENCOUNTER — Other Ambulatory Visit (HOSPITAL_COMMUNITY): Payer: Self-pay

## 2021-02-27 MED ORDER — CYANOCOBALAMIN 1000 MCG/ML IJ SOLN
1000.0000 ug | INTRAMUSCULAR | 0 refills | Status: DC
  Filled 2021-02-27: qty 3, 90d supply, fill #0

## 2021-02-27 MED ORDER — "BD LUER-LOK SYRINGE 25G X 1"" 3 ML MISC"
0 refills | Status: DC
  Filled 2021-02-27: qty 6, 180d supply, fill #0

## 2021-02-28 ENCOUNTER — Other Ambulatory Visit (HOSPITAL_COMMUNITY): Payer: Self-pay

## 2021-03-13 ENCOUNTER — Encounter: Payer: Self-pay | Admitting: Physician Assistant

## 2021-03-13 ENCOUNTER — Other Ambulatory Visit: Payer: Self-pay | Admitting: Physician Assistant

## 2021-03-13 MED ORDER — OZEMPIC (0.25 OR 0.5 MG/DOSE) 2 MG/1.5ML ~~LOC~~ SOPN
0.5000 mg | PEN_INJECTOR | SUBCUTANEOUS | 3 refills | Status: DC
  Filled 2021-03-13: qty 1.5, 28d supply, fill #0

## 2021-03-14 ENCOUNTER — Other Ambulatory Visit (HOSPITAL_COMMUNITY): Payer: Self-pay

## 2021-03-20 ENCOUNTER — Other Ambulatory Visit (HOSPITAL_COMMUNITY): Payer: Self-pay

## 2021-04-13 ENCOUNTER — Other Ambulatory Visit: Payer: Self-pay

## 2021-04-13 ENCOUNTER — Telehealth: Payer: Self-pay

## 2021-04-13 MED ORDER — OZEMPIC (0.25 OR 0.5 MG/DOSE) 2 MG/1.5ML ~~LOC~~ SOPN: 0.5000 mg | PEN_INJECTOR | SUBCUTANEOUS | 3 refills | Status: DC

## 2021-04-13 NOTE — Telephone Encounter (Signed)
Refill sent to pharmacy.   

## 2021-04-18 ENCOUNTER — Other Ambulatory Visit (HOSPITAL_COMMUNITY): Payer: Self-pay

## 2021-05-20 ENCOUNTER — Telehealth: Payer: Self-pay

## 2021-05-20 MED ORDER — MURO 128 5 % OP OINT: TOPICAL_OINTMENT | Freq: Every day | OPHTHALMIC | 0 refills | Status: DC

## 2021-05-20 NOTE — Telephone Encounter (Signed)
Patient called reporting she had run out of ophthalmic solution. Is at work and having significant dryness. Nex appointment is 12/5 with opthalmologist. Will send 1x refill.   Wende Mott, CNM 05/20/21 12:20 PM'

## 2021-06-09 ENCOUNTER — Encounter: Payer: Self-pay | Admitting: Physician Assistant

## 2021-06-12 ENCOUNTER — Telehealth: Payer: Self-pay

## 2021-06-12 ENCOUNTER — Encounter: Payer: Self-pay | Admitting: Dermatology

## 2021-06-12 ENCOUNTER — Ambulatory Visit: Payer: Medicare Other | Admitting: Dermatology

## 2021-06-12 ENCOUNTER — Other Ambulatory Visit: Payer: Self-pay

## 2021-06-12 DIAGNOSIS — Z1283 Encounter for screening for malignant neoplasm of skin: Secondary | ICD-10-CM | POA: Diagnosis not present

## 2021-06-12 DIAGNOSIS — D1801 Hemangioma of skin and subcutaneous tissue: Secondary | ICD-10-CM

## 2021-06-12 DIAGNOSIS — Z85820 Personal history of malignant melanoma of skin: Secondary | ICD-10-CM

## 2021-06-12 DIAGNOSIS — L720 Epidermal cyst: Secondary | ICD-10-CM | POA: Diagnosis not present

## 2021-06-12 NOTE — Telephone Encounter (Signed)
Pt called wanting a referral to an  Ophthalmologist. Pt wants to be referred to Dr Helene Shoe with [facility]. Can referral be placed? Please Advise.

## 2021-06-12 NOTE — Telephone Encounter (Signed)
Please see message and advise if we can back date referral.

## 2021-06-13 NOTE — Telephone Encounter (Signed)
Left message on voicemail to call office.  

## 2021-06-13 NOTE — Telephone Encounter (Signed)
Spoke to pt told her she will need to get referral for Opthalmology from her Optometrist who referred her there. Pt verbalized understanding and said she did get one. She then said she saw Dermatology yesterday and needs a referral for that. Pt said she has Hx of Melona and went for a skin check. Told her okay I will place referral but can not back date referral. Pt verbalized understanding.

## 2021-06-13 NOTE — Telephone Encounter (Signed)
Patient is calling in stating that she missed a call.

## 2021-06-16 ENCOUNTER — Other Ambulatory Visit: Payer: Self-pay | Admitting: Physician Assistant

## 2021-06-27 NOTE — Progress Notes (Signed)
Sherry [name] is a 65 y.o. female here for a abnormal vaginal discharge.  History of Present Illness:   Chief Complaint  Patient presents with   Vaginal Discharge    Pt c/o vaginal discharge w/ smell; no itch or burning and no trouble urinating. Pt states it comes and goes for the last few months    HPI  Vaginal Discharge Sherry [name] presents with c/o intermittent vaginal discharge with a foul smell that has been present for a couple of months. States the discharge has always been dry, white, and thin. Sherry [name] does have a hx of recurrent yeast infections, but states current sx are not similar to past yeast infection sx. According to pt, she did take a medication specified for tx of lymph node dysfunction which provided her with relief.  Pt is not able to remember name of used medication at this time. Denies vaginal itching, burning, trouble urinating, recent sexual activity, or change in hygiene products.    Past Medical History:  Diagnosis Date   Deviated septum    Hepatitis    hx hep b 1984   Hyperlipemia    Hypothyroidism    PONV (postoperative nausea and vomiting)    SCCA (squamous cell carcinoma) of skin 07/04/2017   left nose tx cx3 45fu   Sinusitis, chronic    Tubular adenoma 10/09/2017   No high-grade dysplasia or malignancy      Social History   Tobacco Use   Smoking status: Never   Smokeless tobacco: Never  Substance Use Topics   Alcohol use: No   Drug use: No    Past Surgical History:  Procedure Laterality Date   ANTERIOR CRUCIATE LIGAMENT REPAIR Right    HAND SURGERY Left    HERPES SIMPLEX VIRUS DFA     KNEE SURGERY Left    NASAL SEPTOPLASTY W/ TURBINOPLASTY  08/30/2011   Procedure: NASAL SEPTOPLASTY WITH TURBINATE REDUCTION;  Surgeon: Jerrell Belfast, MD;  Location: [place];  Service: ENT;  Laterality: N/A;  nasal septoplasty, bil. inferior turbinated reduction, bil. endoscopic concha bullosa and anterior ethmoid and maxillary antrostomy,  ethmoidectomy   NASAL SINUS SURGERY  08/30/2011   Procedure: ENDOSCOPIC SINUS SURGERY;  Surgeon: Jerrell Belfast, MD;  Location: [place];  Service: ENT;  Laterality: N/A;   TUBAL LIGATION      Family History  Problem Relation Age of Onset   Leukemia Mother    Lung cancer Father     Allergies  Allergen Reactions   Other Other (See Comments)    Antiretroviral, caused jaundice    Current Medications:   Current Outpatient Medications:    fluticasone (FLONASE) 50 MCG/ACT nasal spray, Place 2 sprays into both nostrils daily., Disp: 16 g, Rfl: 6   OZEMPIC, 0.25 OR 0.5 MG/DOSE, 2 MG/1.5ML SOPN, INJECT 0.5 MG  SUBCUTANEOUSLY ONCE A WEEK, Disp: 4.5 mL, Rfl: 3   SYRINGE-NEEDLE, DISP, 3 ML (B-D 3CC LUER-LOK SYR 25GX1") 25G X 1" 3 ML MISC, Use 1 syringe to inject B12 monthly., Disp: 6 each, Rfl: 0   Turmeric 500 MG CAPS, Take 1 capsule by mouth daily., Disp: , Rfl:    VITAMIN D PO, Take 10,000 Units by mouth daily., Disp: , Rfl:    cyanocobalamin (,VITAMIN B-12,) 1000 MCG/ML injection, Inject 1 mL (1,000 mcg total) into the muscle every 30 (thirty) days. (Patient not taking: Reported on 06/28/2021), Disp: 6 mL, Rfl: 0   MURO 128 5 % ophthalmic ointment, Place into both eyes at bedtime. (  Patient not taking: Reported on 06/28/2021), Disp: 3.5 g, Rfl: 0   zolpidem (AMBIEN) 10 MG tablet, TAKE 1 TABLET BY MOUTH DAILY AS NEEDED, Disp: 30 tablet, Rfl: 1   Review of Systems:   ROS Negative unless otherwise specified per HPI. Vitals:   Vitals:   06/28/21 0818  BP: 124/68  Pulse: 85  Temp: (!) 97.3 F (36.3 C)  TempSrc: Temporal  SpO2: 100%  Height: 5\' 7"  (1.702 m)     Body mass index is 26.66 kg/m.  Physical Exam:   Physical Exam Exam conducted with a chaperone present.  Constitutional:      Appearance: Normal appearance. She is well-developed.  HENT:     Head: Normocephalic and atraumatic.  Eyes:     General: Lids are normal.     Extraocular Movements:  Extraocular movements intact.     Conjunctiva/sclera: Conjunctivae normal.  Pulmonary:     Effort: Pulmonary effort is normal.  Genitourinary:    Labia:        Right: No rash or tenderness.        Left: No rash or tenderness.      Vagina: Normal.     Cervix: Normal.     Uterus: Normal.      Adnexa: Right adnexa normal and left adnexa normal.  Musculoskeletal:        General: Normal range of motion.     Cervical back: Normal range of motion and neck supple.  Skin:    General: Skin is warm and dry.  Neurological:     Mental Status: She is alert and oriented to person, place, and time.  Psychiatric:        Attention and Perception: Attention and perception normal.        Mood and Affect: Mood normal.        Behavior: Behavior normal.        Thought Content: Thought content normal.        Judgment: Judgment normal.    Assessment and Plan:   Acute vaginitis No red flags on exam Cervical swab obtained -- will await results to treat for possible BV (she declined STD testing) Will empirically trial a one time 150 mg diflucan tablet for her symptoms Follow-up based on symptoms  I,Havlyn C Ratchford,acting as a scribe for [employer], PA.,have documented all relevant documentation on the behalf of [name], PA,as directed by  [name], PA while in the presence of [name], [place].  I, [name], [place], have reviewed all documentation for this visit. The documentation on 06/28/21 for the exam, diagnosis, procedures, and orders are all accurate and complete.  [name], PA-C

## 2021-06-28 ENCOUNTER — Other Ambulatory Visit (HOSPITAL_COMMUNITY)
Admission: RE | Admit: 2021-06-28 | Discharge: 2021-06-28 | Disposition: A | Discharge status: Home / Self Care | Payer: Medicare Other | Source: Ambulatory Visit | Attending: Physician Assistant | Admitting: Physician Assistant

## 2021-06-28 ENCOUNTER — Encounter: Payer: Self-pay | Admitting: Physician Assistant

## 2021-06-28 ENCOUNTER — Ambulatory Visit (INDEPENDENT_AMBULATORY_CARE_PROVIDER_SITE_OTHER): Payer: Medicare Other | Admitting: Physician Assistant

## 2021-06-28 ENCOUNTER — Other Ambulatory Visit: Payer: Self-pay

## 2021-06-28 VITALS — BP 124/68 | HR 85 | Temp 97.3°F | Ht 67.0 in

## 2021-06-28 DIAGNOSIS — N760 Acute vaginitis: Secondary | ICD-10-CM

## 2021-06-28 MED ORDER — FLUCONAZOLE 150 MG PO TABS: 150.0000 mg | ORAL_TABLET | Freq: Once | ORAL | 0 refills | Status: AC

## 2021-06-29 LAB — CERVICOVAGINAL ANCILLARY ONLY
Bacterial Vaginitis (gardnerella): NEGATIVE
Candida Glabrata: NEGATIVE
Candida Vaginitis: NEGATIVE
Comment: NEGATIVE
Comment: NEGATIVE
Comment: NEGATIVE

## 2021-07-05 ENCOUNTER — Encounter: Payer: Self-pay | Admitting: Physician Assistant

## 2021-07-07 ENCOUNTER — Encounter: Payer: Self-pay | Admitting: Dermatology

## 2021-07-07 NOTE — Progress Notes (Signed)
° °  New Patient   Subjective  Sherry [name] [id number] is a 66 y.o. female who presents for the following: Skin Problem (Pt state there is a bump on the L Nasal Bridge x 6 month. Pt states theres no pain or discomfort).  New patient, several areas she would like checked. Location:  Duration:  Quality:  Associated Signs/Symptoms: Modifying Factors:  Severity:  Timing: Context:    The following portions of the chart were reviewed this encounter and updated as appropriate:  Tobacco   Allergies   Meds   Problems   Med Hx   Surg Hx   Fam Hx       Objective  Well appearing patient in no apparent distress; mood and affect are within normal limits. Sun exposed areas, upper chest and back examined: No atypical pigmented lesions or nonmelanoma skin cancer.  Left Nasal Sidewall, Left Temple Hard upper dermal half millimeter white papules  Right Lower Back 2 mm smooth red dermal papule    All sun exposed areas plus back examined.   Assessment & Plan  Milia (2) Left Temple; Left Nasal Sidewall  White dome shaped 1-22mm papules. Benign, ok to leave unless patient desires removal   Hemangioma of skin Right Lower Back  Benign no treatment needed.   Encounter for screening for malignant neoplasm of skin  Annual skin examination.  Encouraged to self examine twice annually.

## 2021-07-09 ENCOUNTER — Telehealth: Payer: Self-pay | Admitting: Physician Assistant

## 2021-07-09 NOTE — Telephone Encounter (Signed)
Copied from [place] [id number]. Topic: Medicare AWV >> Jul 09, 2021 10:24 AM Harris-Coley, Hannah Beat wrote: Reason for CRM: Left message for patient to schedule Annual Wellness Visit.  Please schedule with Nurse Health Advisor Charlott Rakes, RN at [facility].  Please call [phone] ask for [facility]

## 2021-07-11 DIAGNOSIS — H18513 Endothelial corneal dystrophy, bilateral: Secondary | ICD-10-CM | POA: Insufficient documentation

## 2021-07-18 ENCOUNTER — Telehealth: Payer: Self-pay | Admitting: Physician Assistant

## 2021-07-18 NOTE — Telephone Encounter (Signed)
Left message on voicemail to call office. Please tell pt [facility] does not have Ozmepic it is on back order. We need to send to local pharmacy.

## 2021-07-18 NOTE — Telephone Encounter (Signed)
Called [facility] pharmacy and spoke to [place] told her supervising physician for Aldona Bar is Dr. Dimas Chyle. Lucille verbalized understanding and said just to let us know patients Ozempic is on backorder may want to check local pharmacy. Told her okay, thanks.

## 2021-07-18 NOTE — Telephone Encounter (Signed)
[facility] needs to confirm Worley's supervising MD info. Please call [phone] ref #:[id number]    Client [place] [facility] [place] Client Site [place] [facility] [place] Night Provider Worley, Aten Type Call Who Is Calling Pharmacy Patient Name Sherry [name] Patient DOB [birth year] Call Type Pharmacy Message Only Reason for Call Request to send message to Office Initial Comment Samantha from [facility] needs to confirm PA S. Worley's supervising MD info. Please call [phone] re reference # [id number] Additional Comment re Rx Ozempic. Office hours provided. Please call to f/u. Pharmacy Name [facility] Pharmacist Name Delta Number [phone] Disp. Time Disposition Final User 07/17/2021 5:42:08 PM General Information Provided Yes Gokounous, Erin Call Closed By: Candy Sledge Transaction Date/Time: 07/17/2021 5:37:23 PM (ET)

## 2021-07-20 ENCOUNTER — Encounter: Payer: Self-pay | Admitting: Physician Assistant

## 2021-07-20 ENCOUNTER — Other Ambulatory Visit (HOSPITAL_COMMUNITY): Payer: Self-pay

## 2021-07-20 NOTE — Telephone Encounter (Signed)
Spoke to pt told her [facility] called and said does not have Ozempic it is on backorder not sure when it will be available. I need to send to local pharmacy. Which pharmacy would you like. Pt verbalized understanding and said she will have to check cost and will send My Chart message where to send medication. Told her okay.

## 2021-07-24 NOTE — Telephone Encounter (Signed)
Called [facility] pharmacy spoke to pharmacist and he said pt received Prevnar 20 on 07/05/2021.

## 2021-10-31 ENCOUNTER — Other Ambulatory Visit: Payer: Self-pay

## 2021-10-31 MED ORDER — OZEMPIC (0.25 OR 0.5 MG/DOSE) 2 MG/1.5ML ~~LOC~~ SOPN: PEN_INJECTOR | SUBCUTANEOUS | 3 refills | Status: DC

## 2021-11-29 DIAGNOSIS — T868419 Corneal transplant failure, unspecified eye: Secondary | ICD-10-CM | POA: Insufficient documentation

## 2021-12-04 ENCOUNTER — Other Ambulatory Visit: Payer: Self-pay | Admitting: Physician Assistant

## 2021-12-05 ENCOUNTER — Other Ambulatory Visit (HOSPITAL_COMMUNITY): Payer: Self-pay

## 2021-12-05 MED ORDER — ZOLPIDEM TARTRATE 10 MG PO TABS
ORAL_TABLET | Freq: Every day | ORAL | 0 refills | Status: DC | PRN
  Filled 2021-12-05: qty 30, 30d supply, fill #0

## 2021-12-05 NOTE — Telephone Encounter (Signed)
Requesting refill for Zolpidem. Last OV 06/28/2021.

## 2021-12-11 DIAGNOSIS — H25812 Combined forms of age-related cataract, left eye: Secondary | ICD-10-CM | POA: Insufficient documentation

## 2021-12-11 DIAGNOSIS — Z9841 Cataract extraction status, right eye: Secondary | ICD-10-CM | POA: Insufficient documentation

## 2021-12-11 DIAGNOSIS — Z947 Corneal transplant status: Secondary | ICD-10-CM | POA: Insufficient documentation

## 2021-12-22 ENCOUNTER — Encounter: Payer: Self-pay | Admitting: Physician Assistant

## 2021-12-25 ENCOUNTER — Ambulatory Visit: Payer: Medicare Other | Admitting: Family Medicine

## 2021-12-25 ENCOUNTER — Encounter: Payer: Self-pay | Admitting: Family Medicine

## 2021-12-25 ENCOUNTER — Ambulatory Visit: Payer: Self-pay

## 2021-12-25 VITALS — BP 120/82 | HR 87 | Ht 67.0 in | Wt 170.0 lb

## 2021-12-25 DIAGNOSIS — M79671 Pain in right foot: Secondary | ICD-10-CM

## 2021-12-25 DIAGNOSIS — S86819A Strain of other muscle(s) and tendon(s) at lower leg level, unspecified leg, initial encounter: Secondary | ICD-10-CM | POA: Diagnosis not present

## 2021-12-25 NOTE — Assessment & Plan Note (Signed)
I believe the patient does have more of a plantaris strain noted.  Discussed icing regimen and home exercises, which activities to do which ones to avoid, discussed proper shoes.  We will hold on anything else at the moment.  Follow-up again in 6 to 8 weeks

## 2021-12-26 ENCOUNTER — Ambulatory Visit: Payer: Medicare Other | Admitting: Physician Assistant

## 2022-01-23 ENCOUNTER — Encounter: Payer: Self-pay | Admitting: *Deleted

## 2022-02-11 ENCOUNTER — Other Ambulatory Visit (HOSPITAL_COMMUNITY): Payer: Self-pay

## 2022-02-11 ENCOUNTER — Encounter: Payer: Self-pay | Admitting: Physician Assistant

## 2022-02-11 MED ORDER — OZEMPIC (0.25 OR 0.5 MG/DOSE) 2 MG/3ML ~~LOC~~ SOPN
0.5000 mg | PEN_INJECTOR | SUBCUTANEOUS | 2 refills | Status: DC
  Filled 2022-02-11: qty 3, 28d supply, fill #0

## 2022-02-12 ENCOUNTER — Other Ambulatory Visit (HOSPITAL_COMMUNITY): Payer: Self-pay

## 2022-03-29 ENCOUNTER — Ambulatory Visit (INDEPENDENT_AMBULATORY_CARE_PROVIDER_SITE_OTHER): Payer: Medicare Other | Admitting: Physician Assistant

## 2022-03-29 ENCOUNTER — Other Ambulatory Visit (HOSPITAL_COMMUNITY): Payer: Self-pay

## 2022-03-29 ENCOUNTER — Other Ambulatory Visit: Payer: Self-pay | Admitting: Physician Assistant

## 2022-03-29 ENCOUNTER — Encounter: Payer: Self-pay | Admitting: Physician Assistant

## 2022-03-29 VITALS — BP 110/60 | HR 84 | Temp 97.1°F | Ht 67.0 in | Wt 159.2 lb

## 2022-03-29 DIAGNOSIS — Z860100 Personal history of colon polyps, unspecified: Secondary | ICD-10-CM | POA: Insufficient documentation

## 2022-03-29 DIAGNOSIS — E785 Hyperlipidemia, unspecified: Secondary | ICD-10-CM

## 2022-03-29 DIAGNOSIS — M8588 Other specified disorders of bone density and structure, other site: Secondary | ICD-10-CM | POA: Diagnosis not present

## 2022-03-29 DIAGNOSIS — E538 Deficiency of other specified B group vitamins: Secondary | ICD-10-CM | POA: Diagnosis not present

## 2022-03-29 DIAGNOSIS — E88819 Insulin resistance, unspecified: Secondary | ICD-10-CM

## 2022-03-29 DIAGNOSIS — Z0000 Encounter for general adult medical examination without abnormal findings: Secondary | ICD-10-CM

## 2022-03-29 DIAGNOSIS — E559 Vitamin D deficiency, unspecified: Secondary | ICD-10-CM | POA: Diagnosis not present

## 2022-03-29 DIAGNOSIS — E8881 Metabolic syndrome: Secondary | ICD-10-CM

## 2022-03-29 DIAGNOSIS — Z1231 Encounter for screening mammogram for malignant neoplasm of breast: Secondary | ICD-10-CM

## 2022-03-29 MED ORDER — SEMAGLUTIDE (1 MG/DOSE) 4 MG/3ML ~~LOC~~ SOPN
1.0000 mg | PEN_INJECTOR | SUBCUTANEOUS | 1 refills | Status: DC
  Filled 2022-03-29: qty 3, 28d supply, fill #0
  Filled 2022-04-26: qty 3, 28d supply, fill #1

## 2022-03-29 MED ORDER — SEMAGLUTIDE (1 MG/DOSE) 4 MG/3ML ~~LOC~~ SOPN: 1.0000 mg | PEN_INJECTOR | SUBCUTANEOUS | 1 refills | Status: DC

## 2022-03-29 NOTE — Progress Notes (Signed)
Subjective:    Sherry [name] is a 66 y.o. female and is here for a comprehensive physical exam.  HPI  Health Maintenance Due  Topic Date Due   MAMMOGRAM  01/31/2020    Acute Concerns: None  Chronic Issues: Insomnia Currently taking ambien 10 mg daily. Tolerating well. Denies falls or other concerns.  Health Maintenance: Immunizations -- UTD Colonoscopy -- March 2019 Mammogram -- overdue PAP -- n/a Bone Density -- 2017 -- osteopenia -- needs updated bone density Diet -- overall eats well, does protein shake in the morning, high protein snack bars, drinking plenty of water Exercise -- doing abdominal exercises and mowing the yard  Sleep habits -- overall stable, takes ambien on the nights before her shift Mood -- overall stable  UTD with dentist? - yes UTD with eye doctor? - yes  Weight history: Wt Readings from Last 10 Encounters:  03/29/22 159 lb 4 oz (72.2 kg)  12/25/21 170 lb (77.1 kg)  02/23/21 170 lb 4 oz (77.2 kg)  08/31/20 182 lb (82.6 kg)  04/30/19 182 lb (82.6 kg)  04/19/19 183 lb (83 kg)  01/12/19 190 lb (86.2 kg)  12/21/18 193 lb (87.5 kg)  12/02/18 191 lb 12.8 oz (87 kg)  01/20/18 185 lb (83.9 kg)   Body mass index is 24.94 kg/m. No LMP recorded. Patient is postmenopausal.  Alcohol use:  reports no history of alcohol use.  Tobacco use:  Tobacco Use: Low Risk  (03/29/2022)   Patient History    Smoking Tobacco Use: Never    Smokeless Tobacco Use: Never    Passive Exposure: Not on file   Eligible for lung cancer screening? N/a     03/29/2022    9:42 AM  Depression screen PHQ 2/9  Decreased Interest 0  Down, Depressed, Hopeless 0  PHQ - 2 Score 0     Other providers/specialists: Patient Care Team: [name], [place] as PCP - General (Physician Assistant) Juanita Craver, MD as Consulting Physician (Gastroenterology) Jerrell Belfast, MD as Consulting Physician (Otolaryngology) Lyndal Pulley, DO as Consulting Physician  (Family Medicine)    PMHx, SurgHx, SocialHx, Medications, and Allergies were reviewed in the Visit Navigator and updated as appropriate.   Past Medical History:  Diagnosis Date   Allergy 2000   Arthritis 1982   Cataract 2022   Clotting disorder (HCC) bleed/bruise easily   Deviated septum    GERD (gastroesophageal reflux disease) 1958   Hepatitis    hx hep b 1984   Hyperlipemia    Hypothyroidism    Neuromuscular disorder (HCC) sciatic pain   PONV (postoperative nausea and vomiting)    SCCA (squamous cell carcinoma) of skin 07/04/2017   left nose tx cx3 61f   Sinusitis, chronic    Tubular adenoma 10/09/2017   No high-grade dysplasia or malignancy      Past Surgical History:  Procedure Laterality Date   ANTERIOR CRUCIATE LIGAMENT REPAIR Right    HAND SURGERY Left    HERPES SIMPLEX VIRUS DFA     KNEE SURGERY Left    NASAL SEPTOPLASTY W/ TURBINOPLASTY  08/30/2011   Procedure: NASAL SEPTOPLASTY WITH TURBINATE REDUCTION;  Surgeon: DJerrell Belfast MD;  Location: [facility]  Service: ENT;  Laterality: N/A;  nasal septoplasty, bil. inferior turbinated reduction, bil. endoscopic concha bullosa and anterior ethmoid and maxillary antrostomy, ethmoidectomy   NASAL SINUS SURGERY  08/30/2011   Procedure: ENDOSCOPIC SINUS SURGERY;  Surgeon: DJerrell Belfast MD;  Location[place] [facility]  Service:  ENT;  Laterality: N/A;   TUBAL LIGATION       Family History  Problem Relation Age of Onset   Leukemia Mother    Cancer Mother    Early death Mother    Miscarriages / [place] Mother    Varicose Veins Mother    Lung cancer Father    Cancer Father    Cancer Maternal Grandfather    Cancer Maternal Grandmother    Stroke Paternal Grandmother    Cancer Maternal Uncle     Social History   Tobacco Use   Smoking status: Never   Smokeless tobacco: Never   Tobacco comments:    none  Substance Use Topics   Alcohol use: No   Drug use: No    Review of  Systems:   Review of Systems  Constitutional:  Negative for chills, fever, malaise/fatigue and weight loss.  HENT:  Negative for hearing loss, sinus pain and sore throat.   Respiratory:  Negative for cough and hemoptysis.   Cardiovascular:  Negative for chest pain, palpitations, leg swelling and PND.  Gastrointestinal:  Negative for abdominal pain, constipation, diarrhea, heartburn, nausea and vomiting.  Genitourinary:  Negative for dysuria, frequency and urgency.  Musculoskeletal:  Negative for back pain, myalgias and neck pain.  Skin:  Negative for itching and rash.  Neurological:  Negative for dizziness, tingling, seizures and headaches.  Endo/Heme/Allergies:  Negative for polydipsia.  Psychiatric/Behavioral:  Negative for depression. The patient is not nervous/anxious.     Objective:   BP 110/60 (BP Location: Left Arm, Patient Position: Sitting, Cuff Size: Normal)   Pulse 84   Temp (!) 97.1 F (36.2 C) (Temporal)   Ht '5\' 7"'$  (1.702 m)   Wt 159 lb 4 oz (72.2 kg)   SpO2 98%   BMI 24.94 kg/m  Body mass index is 24.94 kg/m.   General Appearance:    Alert, cooperative, no distress, appears stated age  Head:    Normocephalic, without obvious abnormality, atraumatic  Eyes:    PERRL, conjunctiva/corneas clear, EOM's intact, fundi    benign, both eyes  Ears:    Normal TM's and external ear canals, both ears  Nose:   Nares normal, septum midline, mucosa normal, no drainage    or sinus tenderness  Throat:   Lips, mucosa, and tongue normal; teeth and gums normal  Neck:   Supple, symmetrical, trachea midline, no adenopathy;    thyroid:  no enlargement/tenderness/nodules; no carotid   bruit or JVD  Back:     Symmetric, no curvature, ROM normal, no CVA tenderness  Lungs:     Clear to auscultation bilaterally, respirations unlabored  Chest Wall:    No tenderness or deformity   Heart:    Regular rate and rhythm, S1 and S2 normal, no murmur, rub or gallop  Breast Exam:    Deferred   Abdomen:     Soft, non-tender, bowel sounds active all four quadrants,    no masses, no organomegaly  Genitalia:    Deferred  Extremities:   Extremities normal, atraumatic, no cyanosis or edema  Pulses:   2+ and symmetric all extremities  Skin:   Skin color, texture, turgor normal, no rashes or lesions  Lymph nodes:   Cervical, supraclavicular, and axillary nodes normal  Neurologic:   CNII-XII intact, normal strength, sensation and reflexes    throughout    Assessment/Plan:   Routine physical examination Today patient counseled on age appropriate routine health concerns for screening and prevention, each reviewed and up  to date or declined. Immunizations reviewed and up to date or declined. Labs ordered and reviewed. Risk factors for depression reviewed and negative. Hearing function and visual acuity are intact. ADLs screened and addressed as needed. Functional ability and level of safety reviewed and appropriate. Education, counseling and referrals performed based on assessed risks today. Patient provided with a copy of personalized plan for preventive services.  Osteopenia of lumbar spine Overdue for repeat dexa Ordered today  Vitamin B12 deficiency Update B12 and provide recommendations accordingly  Vitamin D deficiency Update Vit D and provide recommendations accordingly  Hyperlipidemia, unspecified hyperlipidemia type Update lipid panel and provide recommendations accordingly  Insulin resistance Update A1c and provide recommendations accordingly  [name], PA-C [place]

## 2022-03-29 NOTE — Patient Instructions (Addendum)
It was great to see you!  Please schedule a mammogram  Please ask the front desk staff here to help you schedule your bone density scan. We can do you blood work at the same time.  Bone Density To get your labs , you can walk in at the [facility] location without a scheduled appointment.  The address is [address]. It is across the street from [place] are located in the basement.  Hours of operation are M-F 8:30am to 5:00pm. Please note that they are closed for lunch between 12:30 and 1:00pm.   Take care,  Aldona Bar

## 2022-04-01 ENCOUNTER — Other Ambulatory Visit (HOSPITAL_COMMUNITY): Payer: Self-pay

## 2022-04-02 ENCOUNTER — Encounter: Payer: Self-pay | Admitting: Physician Assistant

## 2022-04-02 ENCOUNTER — Other Ambulatory Visit (INDEPENDENT_AMBULATORY_CARE_PROVIDER_SITE_OTHER): Payer: Medicare Other

## 2022-04-02 ENCOUNTER — Ambulatory Visit (INDEPENDENT_AMBULATORY_CARE_PROVIDER_SITE_OTHER)
Admission: RE | Admit: 2022-04-02 | Discharge: 2022-04-02 | Disposition: A | Discharge status: Home / Self Care | Payer: Medicare Other | Source: Ambulatory Visit | Attending: Physician Assistant | Admitting: Physician Assistant

## 2022-04-02 ENCOUNTER — Telehealth: Payer: Self-pay | Admitting: Physician Assistant

## 2022-04-02 DIAGNOSIS — E559 Vitamin D deficiency, unspecified: Secondary | ICD-10-CM

## 2022-04-02 DIAGNOSIS — E538 Deficiency of other specified B group vitamins: Secondary | ICD-10-CM | POA: Diagnosis not present

## 2022-04-02 DIAGNOSIS — E785 Hyperlipidemia, unspecified: Secondary | ICD-10-CM | POA: Diagnosis not present

## 2022-04-02 DIAGNOSIS — M8588 Other specified disorders of bone density and structure, other site: Secondary | ICD-10-CM

## 2022-04-02 DIAGNOSIS — E88819 Insulin resistance, unspecified: Secondary | ICD-10-CM

## 2022-04-02 DIAGNOSIS — E673 Hypervitaminosis D: Secondary | ICD-10-CM

## 2022-04-02 LAB — HEMOGLOBIN A1C: Hgb A1c MFr Bld: 5.7 % (ref 4.6–6.5)

## 2022-04-02 LAB — COMPREHENSIVE METABOLIC PANEL
ALT: 10 U/L (ref 0–35)
AST: 15 U/L (ref 0–37)
Albumin: 4.2 g/dL (ref 3.5–5.2)
Alkaline Phosphatase: 65 U/L (ref 39–117)
BUN: 21 mg/dL (ref 6–23)
CO2: 28 mEq/L (ref 19–32)
Calcium: 9.4 mg/dL (ref 8.4–10.5)
Chloride: 106 mEq/L (ref 96–112)
Creatinine, Ser: 0.9 mg/dL (ref 0.40–1.20)
GFR: 66 mL/min (ref >60.00)
Glucose, Bld: 82 mg/dL (ref 70–99)
Potassium: 4.1 mEq/L (ref 3.5–5.1)
Sodium: 141 mEq/L (ref 135–145)
Total Bilirubin: 0.6 mg/dL (ref 0.2–1.2)
Total Protein: 7 g/dL (ref 6.0–8.3)

## 2022-04-02 LAB — CBC WITH DIFFERENTIAL/PLATELET
Basophils Absolute: 0 10*3/uL (ref 0.0–0.1)
Basophils Relative: 0.7 % (ref 0.0–3.0)
Eosinophils Absolute: 0.1 10*3/uL (ref 0.0–0.7)
Eosinophils Relative: 2.4 % (ref 0.0–5.0)
HCT: 36.7 % (ref 36.0–46.0)
Hemoglobin: 12.5 g/dL (ref 12.0–15.0)
Lymphocytes Relative: 29.1 % (ref 12.0–46.0)
Lymphs Abs: 1.3 10*3/uL (ref 0.7–4.0)
MCHC: 34.1 g/dL (ref 30.0–36.0)
MCV: 88.7 fl (ref 78.0–100.0)
Monocytes Absolute: 0.3 10*3/uL (ref 0.1–1.0)
Monocytes Relative: 6.6 % (ref 3.0–12.0)
Neutro Abs: 2.7 10*3/uL (ref 1.4–7.7)
Neutrophils Relative %: 61.2 % (ref 43.0–77.0)
Platelets: 228 10*3/uL (ref 150.0–400.0)
RBC: 4.14 Mil/uL (ref 3.87–5.11)
RDW: 13.7 % (ref 11.5–15.5)
WBC: 4.4 10*3/uL (ref 4.0–10.5)

## 2022-04-02 LAB — LIPID PANEL
Cholesterol: 256 mg/dL — ABNORMAL HIGH (ref 0–200)
HDL: 84.4 mg/dL (ref >39.00)
LDL Cholesterol: 160 mg/dL — ABNORMAL HIGH (ref 0–99)
NonHDL: 171.44
Total CHOL/HDL Ratio: 3
Triglycerides: 56 mg/dL (ref 0.0–149.0)
VLDL: 11.2 mg/dL (ref 0.0–40.0)

## 2022-04-02 LAB — VITAMIN B12: Vitamin B-12: 277 pg/mL (ref 211–911)

## 2022-04-02 LAB — VITAMIN D 25 HYDROXY (VIT D DEFICIENCY, FRACTURES): VITD: 101.33 ng/mL (ref 30.00–100.00)

## 2022-04-02 NOTE — Telephone Encounter (Signed)
Critical Lab  Received from [facility]) at 12:50 Vit D - 100.33

## 2022-04-02 NOTE — Telephone Encounter (Signed)
Spoke to pt told her Vit D levels are very elevated per Aldona Bar, need to stop all Vit D supplements. We can recheck in 3 months lab appt only and go from there. Pt verbalized understanding. Orders put in [place].

## 2022-04-03 ENCOUNTER — Other Ambulatory Visit (HOSPITAL_COMMUNITY): Payer: Self-pay

## 2022-04-03 ENCOUNTER — Other Ambulatory Visit: Payer: Self-pay

## 2022-04-04 ENCOUNTER — Encounter: Payer: Self-pay | Admitting: Physician Assistant

## 2022-04-05 ENCOUNTER — Ambulatory Visit
Admission: RE | Admit: 2022-04-05 | Discharge: 2022-04-05 | Disposition: A | Discharge status: Home / Self Care | Payer: Medicare Other | Source: Ambulatory Visit | Attending: Physician Assistant | Admitting: Physician Assistant

## 2022-04-05 DIAGNOSIS — Z1231 Encounter for screening mammogram for malignant neoplasm of breast: Secondary | ICD-10-CM

## 2022-04-06 LAB — LIPOPROTEIN A (LPA): Lipoprotein (a): <10 nmol/L (ref <75)

## 2022-04-18 ENCOUNTER — Other Ambulatory Visit (HOSPITAL_COMMUNITY): Payer: Self-pay

## 2022-04-26 ENCOUNTER — Other Ambulatory Visit (HOSPITAL_COMMUNITY): Payer: Self-pay

## 2022-06-06 ENCOUNTER — Other Ambulatory Visit: Payer: Self-pay | Admitting: Physician Assistant

## 2022-06-10 ENCOUNTER — Other Ambulatory Visit (HOSPITAL_COMMUNITY): Payer: Self-pay

## 2022-06-10 MED ORDER — ZOLPIDEM TARTRATE 10 MG PO TABS
ORAL_TABLET | Freq: Every day | ORAL | 2 refills | Status: AC | PRN
  Filled 2022-06-10: qty 30, 30d supply, fill #0
  Filled 2022-09-03: qty 30, 30d supply, fill #1
  Filled 2022-11-28: qty 30, 30d supply, fill #2

## 2022-06-10 MED ORDER — OZEMPIC (1 MG/DOSE) 4 MG/3ML ~~LOC~~ SOPN
1.0000 mg | PEN_INJECTOR | SUBCUTANEOUS | 1 refills | Status: DC
  Filled 2022-06-10 (×2): qty 3, 28d supply, fill #0
  Filled 2022-07-09 – 2022-07-12 (×2): qty 3, 28d supply, fill #1

## 2022-06-11 ENCOUNTER — Other Ambulatory Visit (HOSPITAL_COMMUNITY): Payer: Self-pay

## 2022-06-18 ENCOUNTER — Encounter: Payer: Self-pay | Admitting: Physician Assistant

## 2022-06-18 DIAGNOSIS — H269 Unspecified cataract: Secondary | ICD-10-CM

## 2022-06-19 ENCOUNTER — Other Ambulatory Visit (HOSPITAL_COMMUNITY): Payer: Self-pay

## 2022-06-19 NOTE — Telephone Encounter (Signed)
Left message on voicemail to call office. Referral placed for Opthalmology.

## 2022-06-20 NOTE — Telephone Encounter (Signed)
Pt called back told her I have placed the referral for the Ophthalmology for upcoming appt. I can not do referral for Dr. Tamala Julian until you have an appt, When you need to see him let me know and then I will place that referral. Pt verbalized understanding.

## 2022-07-04 ENCOUNTER — Ambulatory Visit (INDEPENDENT_AMBULATORY_CARE_PROVIDER_SITE_OTHER): Payer: No Typology Code available for payment source

## 2022-07-04 VITALS — BP 118/64 | HR 93 | Temp 97.8°F | Wt 162.0 lb

## 2022-07-04 DIAGNOSIS — Z0000 Encounter for general adult medical examination without abnormal findings: Secondary | ICD-10-CM | POA: Diagnosis not present

## 2022-07-04 NOTE — Patient Instructions (Signed)
Ms. Sherry [name] , Thank you for taking time to come for your Medicare Wellness Visit. I appreciate your ongoing commitment to your health goals. Please review the following plan we discussed and let me know if I can assist you in the future.   These are the goals we discussed:  Goals   None     This is a list of the screening recommended for you and due dates:  Health Maintenance  Topic Date Due   COVID-19 Vaccine (4 - 2023-24 season) 03/30/2023*   Colon Cancer Screening  09/18/2022   Medicare Annual Wellness Visit  07/05/2023   Mammogram  04/05/2024   DTaP/Tdap/Td vaccine (3 - Td or Tdap) 10/04/2026   Pneumonia Vaccine  Completed   DEXA scan (bone density measurement)  Completed   Zoster (Shingles) Vaccine  Completed   HPV Vaccine  Aged Out   Flu Shot  Discontinued  *Topic was postponed. The date shown is not the original due date.    Advanced directives: Advance directive discussed with you today. Even though you declined this today please call our office should you change your mind and we can give you the proper paperwork for you to fill out.   Conditions/risks identified: stay active   Next appointment: Follow up in one year for your annual wellness visit    Preventive Care 65 Years and Older, Female Preventive care refers to lifestyle choices and visits with your health care provider that can promote health and wellness. What does preventive care include? A yearly physical exam. This is also called an annual well check. Dental exams once or twice a year. Routine eye exams. Ask your health care provider how often you should have your eyes checked. Personal lifestyle choices, including: Daily care of your teeth and gums. Regular physical activity. Eating a healthy diet. Avoiding tobacco and drug use. Limiting alcohol use. Practicing safe sex. Taking low-dose aspirin every day. Taking vitamin and mineral supplements as recommended by your health care provider. What  happens during an annual well check? The services and screenings done by your health care provider during your annual well check will depend on your age, overall health, lifestyle risk factors, and family history of disease. Counseling  Your health care provider may ask you questions about your: Alcohol use. Tobacco use. Drug use. Emotional well-being. Home and relationship well-being. Sexual activity. Eating habits. History of falls. Memory and ability to understand (cognition). Work and work Statistician. Reproductive health. Screening  You may have the following tests or measurements: Height, weight, and BMI. Blood pressure. Lipid and cholesterol levels. These may be checked every 5 years, or more frequently if you are over 30 years old. Skin check. Lung cancer screening. You may have this screening every year starting at age 44 if you have a 30-pack-year history of smoking and currently smoke or have quit within the past 15 years. Fecal occult blood test (FOBT) of the stool. You may have this test every year starting at age 17. Flexible sigmoidoscopy or colonoscopy. You may have a sigmoidoscopy every 5 years or a colonoscopy every 10 years starting at age 25. Hepatitis C blood test. Hepatitis B blood test. Sexually transmitted disease (STD) testing. Diabetes screening. This is done by checking your blood sugar (glucose) after you have not eaten for a while (fasting). You may have this done every 1-3 years. Bone density scan. This is done to screen for osteoporosis. You may have this done starting at age 54. Mammogram. This may be done  every 1-2 years. Talk to your health care provider about how often you should have regular mammograms. Talk with your health care provider about your test results, treatment options, and if necessary, the need for more tests. Vaccines  Your health care provider may recommend certain vaccines, such as: Influenza vaccine. This is recommended every  year. Tetanus, diphtheria, and acellular pertussis (Tdap, Td) vaccine. You may need a Td booster every 10 years. Zoster vaccine. You may need this after age 32. Pneumococcal 13-valent conjugate (PCV13) vaccine. One dose is recommended after age 72. Pneumococcal polysaccharide (PPSV23) vaccine. One dose is recommended after age 8. Talk to your health care provider about which screenings and vaccines you need and how often you need them. This information is not intended to replace advice given to you by your health care provider. Make sure you discuss any questions you have with your health care provider. Document Released: 07/14/2015 Document Revised: 03/06/2016 Document Reviewed: 04/18/2015 Elsevier Interactive Patient Education  2017 [employer] Prevention in the Home Falls can cause injuries. They can happen to people of all ages. There are many things you can do to make your home safe and to help prevent falls. What can I do on the outside of my home? Regularly fix the edges of walkways and driveways and fix any cracks. Remove anything that might make you trip as you walk through a door, such as a raised step or threshold. Trim any bushes or trees on the path to your home. Use bright outdoor lighting. Clear any walking paths of anything that might make someone trip, such as rocks or tools. Regularly check to see if handrails are loose or broken. Make sure that both sides of any steps have handrails. Any raised decks and porches should have guardrails on the edges. Have any leaves, snow, or ice cleared regularly. Use sand or salt on walking paths during winter. Clean up any spills in your garage right away. This includes oil or grease spills. What can I do in the bathroom? Use night lights. Install grab bars by the toilet and in the tub and shower. Do not use towel bars as grab bars. Use non-skid mats or decals in the tub or shower. If you need to sit down in the shower, use a  plastic, non-slip stool. Keep the floor dry. Clean up any water that spills on the floor as soon as it happens. Remove soap buildup in the tub or shower regularly. Attach bath mats securely with double-sided non-slip rug tape. Do not have throw rugs and other things on the floor that can make you trip. What can I do in the bedroom? Use night lights. Make sure that you have a light by your bed that is easy to reach. Do not use any sheets or blankets that are too big for your bed. They should not hang down onto the floor. Have a firm chair that has side arms. You can use this for support while you get dressed. Do not have throw rugs and other things on the floor that can make you trip. What can I do in the kitchen? Clean up any spills right away. Avoid walking on wet floors. Keep items that you use a lot in easy-to-reach places. If you need to reach something above you, use a strong step stool that has a grab bar. Keep electrical cords out of the way. Do not use floor polish or wax that makes floors slippery. If you must use wax, use  non-skid floor wax. Do not have throw rugs and other things on the floor that can make you trip. What can I do with my stairs? Do not leave any items on the stairs. Make sure that there are handrails on both sides of the stairs and use them. Fix handrails that are broken or loose. Make sure that handrails are as long as the stairways. Check any carpeting to make sure that it is firmly attached to the stairs. Fix any carpet that is loose or worn. Avoid having throw rugs at the top or bottom of the stairs. If you do have throw rugs, attach them to the floor with carpet tape. Make sure that you have a light switch at the top of the stairs and the bottom of the stairs. If you do not have them, ask someone to add them for you. What else can I do to help prevent falls? Wear shoes that: Do not have high heels. Have rubber bottoms. Are comfortable and fit you  well. Are closed at the toe. Do not wear sandals. If you use a stepladder: Make sure that it is fully opened. Do not climb a closed stepladder. Make sure that both sides of the stepladder are locked into place. Ask someone to hold it for you, if possible. Clearly mark and make sure that you can see: Any grab bars or handrails. First and last steps. Where the edge of each step is. Use tools that help you move around (mobility aids) if they are needed. These include: Canes. Walkers. Scooters. Crutches. Turn on the lights when you go into a dark area. Replace any light bulbs as soon as they burn out. Set up your furniture so you have a clear path. Avoid moving your furniture around. If any of your floors are uneven, fix them. If there are any pets around you, be aware of where they are. Review your medicines with your doctor. Some medicines can make you feel dizzy. This can increase your chance of falling. Ask your doctor what other things that you can do to help prevent falls. This information is not intended to replace advice given to you by your health care provider. Make sure you discuss any questions you have with your health care provider. Document Released: 04/13/2009 Document Revised: 11/23/2015 Document Reviewed: 07/22/2014 Elsevier Interactive Patient Education  2017 [employer].

## 2022-07-04 NOTE — Progress Notes (Signed)
Subjective:   CLOIS MONTAVON is a 67 y.o. female who presents for an Initial Medicare Annual Wellness Visit.  Review of Systems     Cardiac Risk Factors include: advanced age (>71mn, >>71women)     Objective:    Today's Vitals   07/04/22 1029  BP: 118/64  Pulse: 93  Temp: 97.8 F (36.6 C)  SpO2: 98%  Weight: 162 lb (73.5 kg)   Body mass index is 25.37 kg/m.     07/04/2022   10:38 AM 01/29/2019   11:11 AM 08/26/2011   12:46 PM  Advanced Directives  Does Patient Have a Medical Advance Directive? No No Patient does not have advance directive  Would patient like information on creating a medical advance directive? No - Patient declined No - Patient declined     Current Medications (verified) Outpatient Encounter Medications as of 07/04/2022  Medication Sig   Cyanocobalamin (VITAMIN B 12 PO) Take by mouth.   prednisoLONE acetate (PRED FORTE) 1 % ophthalmic suspension Place 1 drop into the right eye 3 (three) times daily.   Semaglutide, 1 MG/DOSE, (OZEMPIC, 1 MG/DOSE,) 4 MG/3ML SOPN Inject 1 mg as directed once a week.   Turmeric 500 MG CAPS Take 1 capsule by mouth daily.   zolpidem (AMBIEN) 10 MG tablet TAKE 1 TABLET BY MOUTH DAILY AS NEEDED   VITAMIN D PO Take 10,000 Units by mouth daily. (Patient not taking: Reported on 07/04/2022)   No facility-administered encounter medications on file as of 07/04/2022.    Allergies (verified) Reyataz [atazanavir sulfate] and Morphine   History: Past Medical History:  Diagnosis Date   Allergy 2000   Arthritis 1982   Cataract 2022   Clotting disorder (HCC) bleed/bruise easily   Deviated septum    GERD (gastroesophageal reflux disease) 1958   Hepatitis    hx hep b 1984   Hyperlipemia    Hypothyroidism    Neuromuscular disorder (HCC) sciatic pain   PONV (postoperative nausea and vomiting)    SCCA (squamous cell carcinoma) of skin 07/04/2017   left nose tx [id number]  Sinusitis, chronic    Tubular adenoma 10/09/2017   No  high-grade dysplasia or malignancy    Past Surgical History:  Procedure Laterality Date   ANTERIOR CRUCIATE LIGAMENT REPAIR Right    HAND SURGERY Left    HERPES SIMPLEX VIRUS DFA     KNEE SURGERY Left    NASAL SEPTOPLASTY W/ TURBINOPLASTY  08/30/2011   Procedure: NASAL SEPTOPLASTY WITH TURBINATE REDUCTION;  Surgeon: DaJerrell BelfastMD;  [place] [facility] Service: ENT;  Laterality: N/A;  nasal septoplasty, bil. inferior turbinated reduction, bil. endoscopic concha bullosa and anterior ethmoid and maxillary antrostomy, ethmoidectomy   NASAL SINUS SURGERY  08/30/2011   Procedure: ENDOSCOPIC SINUS SURGERY;  Surgeon: DaJerrell BelfastMD;  [place] [facility] Service: [place];  Laterality: N/A;   TUBAL LIGATION     Family History  Problem Relation Age of Onset   Leukemia Mother    Cancer Mother    Early death Mother    Miscarriages / StKoreaother    Varicose Veins Mother    Lung cancer Father    Cancer Father    Cancer Maternal Grandfather    Cancer Maternal Grandmother    Stroke Paternal Grandmother    Cancer Maternal Uncle    Social History   Socioeconomic History   Marital status: Divorced    Spouse name: Not on file   Number of  children: Not on file   Years of education: Not on file   Highest education level: Not on file  Occupational History    Employer: [employer]  Tobacco Use   Smoking status: Never   Smokeless tobacco: Never   Tobacco comments:    none  Substance and Sexual Activity   Alcohol use: No   Drug use: No   Sexual activity: Never  Other Topics Concern   Not on file  Social History Narrative   Lives with oldest and youngest son   Social Determinants of Health   Financial Resource Strain: Low Risk  (07/04/2022)   Overall Financial Resource Strain (CARDIA)    Difficulty of Paying Living Expenses: Not hard at all  Food Insecurity: No Food Insecurity (07/04/2022)   Hunger Vital Sign    Worried About Running  Out of Food in the Last Year: Never true    Ran Out of Food in the Last Year: Never true  Transportation Needs: No Transportation Needs (07/04/2022)   PRAPARE - Hydrologist (Medical): No    Lack of Transportation (Non-Medical): No  Physical Activity: Insufficiently Active (07/04/2022)   Exercise Vital Sign    Days of Exercise per Week: 3 days    Minutes of Exercise per Session: 40 min  Stress: Stress Concern Present (07/04/2022)   [place]    Feeling of Stress : To some extent  Social Connections: Moderately Isolated (07/04/2022)   Social Connection and Isolation Panel [NHANES]    Frequency of Communication with Friends and Family: Three times a week    Frequency of Social Gatherings with Friends and Family: Three times a week    Attends Religious Services: 1 to 4 times per year    Active Member of Clubs or Organizations: No    Attends Archivist Meetings: Never    Marital Status: Widowed    Tobacco Counseling Counseling given: Not Answered Tobacco comments: none   Clinical Intake:  Pre-visit preparation completed: Yes  Pain : No/denies pain     BMI - recorded: 25.37 Nutritional Status: BMI 25 -29 Overweight Nutritional Risks: None Diabetes: No  How often do you need to have someone help you when you read instructions, pamphlets, or other written materials from your doctor or pharmacy?: 1 - Never  Diabetic?no  Interpreter Needed?: No  Information entered by :: Charlott Rakes, LPN   Activities of Daily Living    07/04/2022   10:39 AM  In your present state of health, do you have any difficulty performing the following activities:  Hearing? 0  Vision? 0  Difficulty concentrating or making decisions? 0  Walking or climbing stairs? 0  Dressing or bathing? 0  Doing errands, shopping? 0  Preparing Food and eating ? N  Using the Toilet? N  In the past six months,  have you accidently leaked urine? Y  Comment at times  Do you have problems with loss of bowel control? N  Managing your Medications? N  Managing your Finances? N  Housekeeping or managing your Housekeeping? N    Patient Care Team: [name], [place] as PCP - General (Physician Assistant) Juanita Craver, MD as Consulting Physician (Gastroenterology) Jerrell Belfast, MD as Consulting Physician (Otolaryngology) Lyndal Pulley, DO as Consulting Physician (Family Medicine)  Indicate any recent Medical Services you may have received from other than [facility] providers in the past year (date may be approximate).  Assessment:   This is a routine wellness examination for Kalysta.  Hearing/Vision screen Hearing Screening - Comments:: Pt stated slight HOH  Vision Screening - Comments:: [facility]   Dietary issues and exercise activities discussed: Current Exercise Habits: Home exercise routine, Type of exercise: walking, Time (Minutes): 45, Frequency (Times/Week): 3, Weekly Exercise (Minutes/Week): 135   Goals Addressed             This Visit's Progress    Patient Stated       Stay active        Depression Screen    07/04/2022   10:36 AM 03/29/2022    9:42 AM 02/23/2021   11:04 AM 12/02/2018    1:26 PM 07/13/2018    2:03 PM 09/19/2017    2:07 PM  PHQ 2/9 Scores  PHQ - 2 Score 0 0 0 1 0 0  PHQ- 9 Score    '7 3 3    '$ Fall Risk    07/04/2022   10:39 AM 03/29/2022    9:41 AM 02/23/2021   11:04 AM 12/02/2018    1:21 PM 09/19/2017    2:07 PM  [place] in the past year? 0 0 1 0 Yes  Number falls in past yr: 0 0 1 0 2 or more  Injury with Fall? 0 0 1 0 No  Risk for fall due to : Impaired vision  Other (Comment)  History of fall(s)  Risk for fall due to: Comment   stepped into a ground hog hole    Follow up Falls prevention discussed Falls evaluation completed Falls evaluation completed  Falls prevention discussed    FALL RISK PREVENTION  PERTAINING TO THE HOME:  Any stairs in or around the home? Yes  If so, are there any without handrails? Yes  Home free of loose throw rugs in walkways, pet beds, electrical cords, etc? Yes  Adequate lighting in your home to reduce risk of falls? Yes   ASSISTIVE DEVICES UTILIZED TO PREVENT FALLS:  Life alert? No  Use of a cane, walker or w/c? No  Grab bars in the bathroom? Yes  Shower chair or bench in shower? No  Elevated toilet seat or a handicapped toilet? Yes   TIMED UP AND GO:  Was the test performed? Yes .  Length of time to ambulate 10 feet: 10 sec.   Gait steady and fast without use of assistive device  Cognitive Function:        07/04/2022   10:40 AM  6CIT Screen  What Year? 0 points  What month? 0 points  What time? 0 points  Count back from 20 0 points  Months in reverse 0 points  Repeat phrase 0 points  Total Score 0 points    Immunizations Immunization History  Administered Date(s) Administered   Fluad Quad(high Dose 65+) 03/23/2022   Influenza-Unspecified 03/01/2014, 03/01/2015   Moderna SARS-COV2 Booster Vaccination 04/24/2021   PFIZER(Purple Top)SARS-COV-2 Vaccination 06/22/2019, 07/13/2019, 03/11/2020   PNEUMOCOCCAL CONJUGATE-20 07/05/2021   Pneumococcal Conjugate-13 06/13/2020   Rsv, Mab, Nirsevimab-alip, 1 Ml, Neonate To 24 Mos(Beyfortus) 05/17/2022   Tdap 04/02/2005, 10/03/2016   Zoster Recombinat (Shingrix) 04/16/2021, 07/05/2021    TDAP status: Up to date  Flu Vaccine status: Up to date  Pneumococcal vaccine status: Up to date  Covid-19 vaccine status: Completed vaccines  Qualifies for Shingles Vaccine? Yes   Zostavax completed Yes   Shingrix Completed?: Yes  Screening Tests Health Maintenance  Topic Date Due   COVID-19 Vaccine (4 - 2023-24 season) 03/30/2023 (Originally 03/01/2022)   COLONOSCOPY (Pts 45-36yr Insurance coverage will need to be confirmed)  09/18/2022   Medicare Annual Wellness (AWV)  07/05/2023   MAMMOGRAM   04/05/2024   DTaP/Tdap/Td (3 - Td or Tdap) 10/04/2026   Pneumonia Vaccine 67 Years old  Completed   DEXA SCAN  Completed   Zoster Vaccines- Shingrix  Completed   HPV VACCINES  Aged Out   INFLUENZA VACCINE  Discontinued    Health Maintenance  There are no preventive care reminders to display for this patient.   Colorectal cancer screening: Type of screening: Colonoscopy. Completed 09/17/17. Repeat every 5 years  Mammogram status: Completed 04/05/22. Repeat every year  Bone Density status: Completed 04/02/22. Results reflect: Bone density results: OSTEOPOROSIS. Repeat every 2 years.   Additional Screening:   Vision Screening: Recommended annual ophthalmology exams for early detection of glaucoma and other disorders of the eye. Is the patient up to date with their annual eye exam?  Yes  Who is the provider or what is the name of the office in which the patient attends annual eye exams? [facility]  If pt is not established with a provider, would they like to be referred to a provider to establish care? No .   Dental Screening: Recommended annual dental exams for proper oral hygiene  Community Resource Referral / Chronic Care Management: CRR required this visit?  No   CCM required this visit?  No      Plan:     I have personally reviewed and noted the following in the patient's chart:   Medical and social history Use of alcohol, tobacco or illicit drugs  Current medications and supplements including opioid prescriptions. Patient is not currently taking opioid prescriptions. Functional ability and status Nutritional status Physical activity Advanced directives List of other physicians Hospitalizations, surgeries, and ER visits in previous 12 months Vitals Screenings to include cognitive, depression, and falls Referrals and appointments  In addition, I have reviewed and discussed with patient certain preventive protocols, quality metrics, and best practice  recommendations. A written personalized care plan for preventive services as well as general preventive health recommendations were provided to patient.     TWillette Brace LPN   16/12/6718  Nurse Notes: none

## 2022-07-08 ENCOUNTER — Encounter: Payer: Self-pay | Admitting: Physician Assistant

## 2022-07-08 NOTE — Telephone Encounter (Signed)
Please see message and advise or dose pt need an appt?

## 2022-07-09 ENCOUNTER — Other Ambulatory Visit: Payer: Self-pay

## 2022-07-09 ENCOUNTER — Other Ambulatory Visit (HOSPITAL_COMMUNITY): Payer: Self-pay

## 2022-07-09 MED ORDER — MONTELUKAST SODIUM 10 MG PO TABS
10.0000 mg | ORAL_TABLET | Freq: Every day | ORAL | 3 refills | Status: DC
  Filled 2022-07-09: qty 30, 30d supply, fill #0
  Filled 2022-08-04: qty 30, 30d supply, fill #1
  Filled 2022-09-03: qty 30, 30d supply, fill #2
  Filled 2022-10-03: qty 30, 30d supply, fill #3

## 2022-07-12 ENCOUNTER — Other Ambulatory Visit (HOSPITAL_COMMUNITY): Payer: Self-pay

## 2022-07-17 DIAGNOSIS — Z947 Corneal transplant status: Secondary | ICD-10-CM | POA: Diagnosis not present

## 2022-07-17 DIAGNOSIS — Z961 Presence of intraocular lens: Secondary | ICD-10-CM | POA: Diagnosis not present

## 2022-07-17 DIAGNOSIS — Z48298 Encounter for aftercare following other organ transplant: Secondary | ICD-10-CM | POA: Diagnosis not present

## 2022-07-17 DIAGNOSIS — Z48810 Encounter for surgical aftercare following surgery on the sense organs: Secondary | ICD-10-CM | POA: Diagnosis not present

## 2022-07-18 ENCOUNTER — Telehealth: Payer: Self-pay | Admitting: Physician Assistant

## 2022-07-18 ENCOUNTER — Other Ambulatory Visit (HOSPITAL_COMMUNITY): Payer: Self-pay

## 2022-07-18 NOTE — Telephone Encounter (Signed)
Patient states [facility] sent a PA request for Ozempic approx. 07/11/22 but has not received the PA (states what Pharmacist looked up said not complete).  Patient states she is out of Ozempic.  Patient requests to be called for status of PA.   Marland Kitchen

## 2022-07-22 MED ORDER — WEGOVY 1 MG/0.5ML ~~LOC~~ SOAJ
1.0000 mg | SUBCUTANEOUS | 0 refills | Status: DC
  Filled 2022-07-22: qty 2, 28d supply, fill #0

## 2022-07-22 NOTE — Telephone Encounter (Signed)
Spoke to pt told her we thought you were paying out of pocket for your Ozempic? Pt said no, it has been going through insurance. Told her Aldona Bar said If we need to do a PA, it will likely not get approved as she is not diabetic. It is no longer covered unless you are diabetic. Pt verbalized understanding.  Please let her know that we can try to switch to [facility] however we cannot guarantee coverage or availability of this medication.  Pt said okay. Told her I will send Rx to the pharmacy. Rx sent.

## 2022-07-22 NOTE — Addendum Note (Signed)
Addended by: Marian Sorrow on: 07/22/2022 04:48 PM   Modules accepted: Orders

## 2022-07-23 ENCOUNTER — Other Ambulatory Visit (HOSPITAL_COMMUNITY): Payer: Self-pay

## 2022-07-23 ENCOUNTER — Other Ambulatory Visit: Payer: Self-pay

## 2022-07-24 ENCOUNTER — Other Ambulatory Visit (HOSPITAL_COMMUNITY): Payer: Self-pay

## 2022-08-01 ENCOUNTER — Other Ambulatory Visit: Payer: Self-pay

## 2022-08-05 ENCOUNTER — Other Ambulatory Visit (HOSPITAL_COMMUNITY): Payer: Self-pay

## 2022-08-06 ENCOUNTER — Other Ambulatory Visit (HOSPITAL_COMMUNITY): Payer: Self-pay

## 2022-08-07 ENCOUNTER — Telehealth: Payer: Self-pay | Admitting: *Deleted

## 2022-08-07 NOTE — Telephone Encounter (Signed)
PA needed for Wegovy 1 mg, PA done thru Covermymeds. Awaiting determination. KEY: [id number]

## 2022-08-08 NOTE — Telephone Encounter (Signed)
Received response from Covermymeds, request denied under Medicare Part D drug plan cannot cover drugs used for loss of appetite, weight loss or weight gain. Excluded from coverage.

## 2022-08-09 NOTE — Telephone Encounter (Signed)
Left message on voicemail to call office.  

## 2022-08-12 NOTE — Telephone Encounter (Signed)
Spoke to pt told her received response from insurance Wegovy 1 mg was denied. Request denied under Medicare Part D drug plan cannot cover drugs used for loss of appetite, weight loss or weight gain. Excluded from coverage. Pt verbalized understanding.

## 2022-08-21 DIAGNOSIS — Z947 Corneal transplant status: Secondary | ICD-10-CM | POA: Diagnosis not present

## 2022-08-21 DIAGNOSIS — Z9842 Cataract extraction status, left eye: Secondary | ICD-10-CM | POA: Diagnosis not present

## 2022-08-21 DIAGNOSIS — Z961 Presence of intraocular lens: Secondary | ICD-10-CM | POA: Diagnosis not present

## 2022-08-21 DIAGNOSIS — H26492 Other secondary cataract, left eye: Secondary | ICD-10-CM | POA: Diagnosis not present

## 2022-08-21 DIAGNOSIS — Z885 Allergy status to narcotic agent status: Secondary | ICD-10-CM | POA: Diagnosis not present

## 2022-08-21 DIAGNOSIS — Z48810 Encounter for surgical aftercare following surgery on the sense organs: Secondary | ICD-10-CM | POA: Diagnosis not present

## 2022-08-21 DIAGNOSIS — Z9841 Cataract extraction status, right eye: Secondary | ICD-10-CM | POA: Diagnosis not present

## 2022-09-04 ENCOUNTER — Other Ambulatory Visit: Payer: Self-pay

## 2022-09-25 DIAGNOSIS — Z9842 Cataract extraction status, left eye: Secondary | ICD-10-CM | POA: Diagnosis not present

## 2022-09-25 DIAGNOSIS — Z947 Corneal transplant status: Secondary | ICD-10-CM | POA: Diagnosis not present

## 2022-09-25 DIAGNOSIS — Z961 Presence of intraocular lens: Secondary | ICD-10-CM | POA: Diagnosis not present

## 2022-09-25 DIAGNOSIS — Z9841 Cataract extraction status, right eye: Secondary | ICD-10-CM | POA: Diagnosis not present

## 2022-09-25 DIAGNOSIS — Z9889 Other specified postprocedural states: Secondary | ICD-10-CM | POA: Diagnosis not present

## 2022-09-25 DIAGNOSIS — Z48810 Encounter for surgical aftercare following surgery on the sense organs: Secondary | ICD-10-CM | POA: Diagnosis not present

## 2022-10-03 ENCOUNTER — Other Ambulatory Visit: Payer: Self-pay

## 2022-10-28 ENCOUNTER — Other Ambulatory Visit: Payer: Self-pay | Admitting: Physician Assistant

## 2022-10-29 ENCOUNTER — Other Ambulatory Visit (HOSPITAL_COMMUNITY): Payer: Self-pay

## 2022-10-29 MED ORDER — MONTELUKAST SODIUM 10 MG PO TABS
10.0000 mg | ORAL_TABLET | Freq: Every day | ORAL | 3 refills | Status: DC
  Filled 2022-10-29: qty 30, 30d supply, fill #0
  Filled 2022-11-28: qty 30, 30d supply, fill #1
  Filled 2022-12-31: qty 30, 30d supply, fill #2
  Filled 2023-01-27: qty 30, 30d supply, fill #3

## 2022-11-28 ENCOUNTER — Other Ambulatory Visit: Payer: Self-pay

## 2022-12-04 ENCOUNTER — Encounter: Payer: Self-pay | Admitting: Family Medicine

## 2022-12-04 ENCOUNTER — Other Ambulatory Visit: Payer: Self-pay

## 2022-12-04 ENCOUNTER — Ambulatory Visit: Payer: No Typology Code available for payment source | Admitting: Family Medicine

## 2022-12-04 VITALS — BP 122/82 | HR 85 | Ht 67.0 in | Wt 172.0 lb

## 2022-12-04 DIAGNOSIS — G8929 Other chronic pain: Secondary | ICD-10-CM

## 2022-12-04 DIAGNOSIS — M25562 Pain in left knee: Secondary | ICD-10-CM

## 2022-12-04 NOTE — Patient Instructions (Addendum)
Injection in knee today Good to see you! Wart removal cream daily for 1 week Pedi-egg on foot for 1 week [employer] rep will reach out to you about brace adjustment See you again in 2 months

## 2022-12-04 NOTE — Progress Notes (Signed)
[name] [address] [place] [address] Phone: [phone] Subjective:   Bruce Donath, am serving as a scribe for Dr. Antoine Primas.  I'm seeing this patient by the request  of:  Jarold Motto, [name]  CC: Left knee pain follow-up  [id number]  ZAYDEE GOUIN is a 67 y.o. female coming in with complaint of L knee pain. Seen for L knee pain in 2020.  At that time was found to have some arthritic changes.  Patient was given custom brace and was doing well.  Patient states that she is unable to manage pain in knee recently. Has decrease in ROM. Pain in back of knee with walking. Sit to stand produces anterior knee pain as well as flexion of her knee.   Reviewing patient's chart has had cataract surgery in March of this year.     Past Medical History:  Diagnosis Date   Allergy 2000   Arthritis 1982   Cataract 2022   Clotting disorder (HCC) bleed/bruise easily   Deviated septum    GERD (gastroesophageal reflux disease) 1958   Hepatitis    hx hep b 1984   Hyperlipemia    Hypothyroidism    Neuromuscular disorder (HCC) sciatic pain   PONV (postoperative nausea and vomiting)    SCCA (squamous cell carcinoma) of skin 07/04/2017   left nose tx cx3 30fu   Sinusitis, chronic    Tubular adenoma 10/09/2017   No high-grade dysplasia or malignancy    Past Surgical History:  Procedure Laterality Date   ANTERIOR CRUCIATE LIGAMENT REPAIR Right    HAND SURGERY Left    HERPES SIMPLEX VIRUS DFA     KNEE SURGERY Left    NASAL SEPTOPLASTY W/ TURBINOPLASTY  08/30/2011   Procedure: NASAL SEPTOPLASTY WITH TURBINATE REDUCTION;  Surgeon: Osborn Coho, MD;  Location: [facility];  Service: ENT;  Laterality: N/A;  nasal septoplasty, bil. inferior turbinated reduction, bil. endoscopic concha bullosa and anterior ethmoid and maxillary antrostomy, ethmoidectomy   NASAL SINUS SURGERY  08/30/2011   Procedure: ENDOSCOPIC SINUS SURGERY;   Surgeon: Osborn Coho, MD;  Location: [facility];  Service: ENT;  Laterality: N/A;   TUBAL LIGATION     Social History   Socioeconomic History   Marital status: Divorced    Spouse name: Not on file   Number of children: Not on file   Years of education: Not on file   Highest education level: Not on file  Occupational History    Employer: [employer]  Tobacco Use   Smoking status: Never   Smokeless tobacco: Never   Tobacco comments:    none  Substance and Sexual Activity   Alcohol use: No   Drug use: No   Sexual activity: Never  Other Topics Concern   Not on file  Social History Narrative   Lives with oldest and youngest son   Social Determinants of Health   Financial Resource Strain: Low Risk  (07/04/2022)   Overall Financial Resource Strain (CARDIA)    Difficulty of Paying Living Expenses: Not hard at all  Food Insecurity: No Food Insecurity (07/04/2022)   Hunger Vital Sign    Worried About Radiation protection practitioner of Food in the Last Year: Never true    Ran Out of Food in the Last Year: Never true  Transportation Needs: No Transportation Needs (07/04/2022)   PRAPARE - Administrator, Civil Service (Medical): No    Lack of Transportation (Non-Medical): No  Physical Activity: Insufficiently Active (07/04/2022)   Exercise Vital Sign    Days of Exercise per Week: 3 days    Minutes of Exercise per Session: 40 min  Stress: Stress Concern Present (07/04/2022)   [employer] of Occupational Health - Occupational Stress Questionnaire    Feeling of Stress : To some extent  Social Connections: Moderately Isolated (07/04/2022)   Social Connection and Isolation Panel [NHANES]    Frequency of Communication with Friends and Family: Three times a week    Frequency of Social Gatherings with Friends and Family: Three times a week    Attends Religious Services: 1 to 4 times per year    Active Member of Clubs or Organizations: No    Attends Banker  Meetings: Never    Marital Status: Widowed   Allergies  Allergen Reactions   Reyataz [Atazanavir Sulfate] Other (See Comments)    Jaundice   Morphine Nausea And Vomiting   Family History  Problem Relation Age of Onset   Leukemia Mother    Cancer Mother    Early death Mother    Miscarriages / [place] Mother    Varicose Veins Mother    Lung cancer Father    Cancer Father    Cancer Maternal Grandfather    Cancer Maternal Grandmother    Stroke Paternal Grandmother    Cancer Maternal Uncle       Current Outpatient Medications (Respiratory):    montelukast (SINGULAIR) 10 MG tablet, Take 1 tablet (10 mg total) by mouth at bedtime.   Current Outpatient Medications (Hematological):    Cyanocobalamin (VITAMIN B 12 PO), Take by mouth.  Current Outpatient Medications (Other):    prednisoLONE acetate (PRED FORTE) 1 % ophthalmic suspension, Place 1 drop into the right eye 3 (three) times daily.   Turmeric 500 MG CAPS, Take 1 capsule by mouth daily.   VITAMIN D PO, Take 10,000 Units by mouth daily.   zolpidem (AMBIEN) 10 MG tablet, TAKE 1 TABLET BY MOUTH DAILY AS NEEDED   Reviewed prior external information including notes and imaging from  primary care provider As well as notes that were available from care everywhere and other healthcare systems.  Past medical history, social, surgical and family history all reviewed in electronic medical record.  No pertanent information unless stated regarding to the chief complaint.   Review of Systems:  No headache, visual changes, nausea, vomiting, diarrhea, constipation, dizziness, abdominal pain, skin rash, fevers, chills, night sweats, weight loss, swollen lymph nodes, body aches, chest pain, shortness of breath, mood changes. POSITIVE muscle aches, joint swelling  Objective  Blood pressure 122/82, pulse 85, height 5\' 7"  (1.702 m), weight 172 lb (78 kg), SpO2 98 %.   General: No apparent distress alert and oriented x3 mood and  affect normal, dressed appropriately.  HEENT: Pupils equal, extraocular movements intact  Respiratory: Patient's speak in full sentences and does not appear short of breath  Cardiovascular: No lower extremity edema, non tender, no erythema  Left knee exam shows arthritic changes noted.  No crepitus noted. Instability noted with valgus and varus force  Limited muscular skeletal ultrasound was performed and interpreted by Antoine Primas, M  Significant bone-on-bone osteoarthritic changes with postsurgical changes of the medial meniscus.  What appears to be an acute tear of the lateral meniscus noted. Impression: Near bone-on-bone osteoarthritic changes of the medial compartment  After informed written and verbal consent, patient was seated on exam table. Left knee was prepped with alcohol swab and utilizing anterolateral  approach, patient's left knee space was injected with 4:1  marcaine 0.5%: Kenalog 40mg /dL. Patient tolerated the procedure well without immediate complications.    Impression and Recommendations:     The above documentation has been reviewed and is accurate and complete Judi Saa, DO

## 2022-12-13 ENCOUNTER — Other Ambulatory Visit: Payer: Self-pay | Admitting: Physician Assistant

## 2022-12-13 ENCOUNTER — Encounter: Payer: Self-pay | Admitting: Physician Assistant

## 2022-12-13 DIAGNOSIS — E663 Overweight: Secondary | ICD-10-CM

## 2022-12-19 ENCOUNTER — Encounter: Payer: Self-pay | Admitting: Family Medicine

## 2022-12-20 ENCOUNTER — Other Ambulatory Visit: Payer: Self-pay | Admitting: Family Medicine

## 2022-12-20 MED ORDER — PREDNISONE 20 MG PO TABS: ORAL_TABLET | ORAL | 0 refills | Status: DC

## 2022-12-20 NOTE — Telephone Encounter (Signed)
Patient called to follow up on this. Nothing has been sent in yet. Could we send this in for her today?

## 2022-12-20 NOTE — Progress Notes (Unsigned)
Prednisone 40mg  5 days placed

## 2022-12-20 NOTE — Telephone Encounter (Signed)
Patient notified

## 2022-12-25 DIAGNOSIS — Z947 Corneal transplant status: Secondary | ICD-10-CM | POA: Diagnosis not present

## 2022-12-25 DIAGNOSIS — Z961 Presence of intraocular lens: Secondary | ICD-10-CM | POA: Diagnosis not present

## 2022-12-25 DIAGNOSIS — Z9841 Cataract extraction status, right eye: Secondary | ICD-10-CM | POA: Diagnosis not present

## 2022-12-25 DIAGNOSIS — Z9842 Cataract extraction status, left eye: Secondary | ICD-10-CM | POA: Diagnosis not present

## 2022-12-31 ENCOUNTER — Other Ambulatory Visit (HOSPITAL_COMMUNITY): Payer: Self-pay

## 2023-01-08 DIAGNOSIS — Z0100 Encounter for examination of eyes and vision without abnormal findings: Secondary | ICD-10-CM | POA: Diagnosis not present

## 2023-01-09 ENCOUNTER — Ambulatory Visit: Payer: No Typology Code available for payment source | Admitting: Family Medicine

## 2023-01-27 ENCOUNTER — Other Ambulatory Visit (HOSPITAL_COMMUNITY): Payer: Self-pay

## 2023-01-30 NOTE — Progress Notes (Signed)
[name] [address] [place] [address] Phone: [phone] Subjective:   INadine Counts, am serving as a scribe for Dr. Antoine Primas.  I'm seeing this patient by the request  of:  Jarold Motto, [name]  CC: left knee pain f/u   [id number]  12/04/2022 L knee injection  Update 02/03/2023 Kiomara Colchado [place]erbrock is a 67 y.o. female coming in with complaint of L knee pain. Injection of the knee at last exam.  Patient states same pain nothing new. Weak this past Friday and Saturday.       Past Medical History:  Diagnosis Date   Allergy 2000   Arthritis 1982   Cataract 2022   Clotting disorder (HCC) bleed/bruise easily   Deviated septum    GERD (gastroesophageal reflux disease) 1958   Hepatitis    hx hep b 1984   Hyperlipemia    Hypothyroidism    Neuromuscular disorder (HCC) sciatic pain   PONV (postoperative nausea and vomiting)    SCCA (squamous cell carcinoma) of skin 07/04/2017   left nose tx cx3 95fu   Sinusitis, chronic    Tubular adenoma 10/09/2017   No high-grade dysplasia or malignancy    Past Surgical History:  Procedure Laterality Date   ANTERIOR CRUCIATE LIGAMENT REPAIR Right    HAND SURGERY Left    HERPES SIMPLEX VIRUS DFA     KNEE SURGERY Left    NASAL SEPTOPLASTY W/ TURBINOPLASTY  08/30/2011   Procedure: NASAL SEPTOPLASTY WITH TURBINATE REDUCTION;  Surgeon: Osborn Coho, MD;  Location: [facility];  Service: ENT;  Laterality: N/A;  nasal septoplasty, bil. inferior turbinated reduction, bil. endoscopic concha bullosa and anterior ethmoid and maxillary antrostomy, ethmoidectomy   NASAL SINUS SURGERY  08/30/2011   Procedure: ENDOSCOPIC SINUS SURGERY;  Surgeon: Osborn Coho, MD;  Location: [facility];  Service: ENT;  Laterality: N/A;   TUBAL LIGATION     Social History   Socioeconomic History   Marital status: Divorced    Spouse name: Not on file   Number of children: Not on file    Years of education: Not on file   Highest education level: Not on file  Occupational History    Employer: [employer]  Tobacco Use   Smoking status: Never   Smokeless tobacco: Never   Tobacco comments:    none  Substance and Sexual Activity   Alcohol use: No   Drug use: No   Sexual activity: Never  Other Topics Concern   Not on file  Social History Narrative   Lives with oldest and youngest son   Social Determinants of Health   Financial Resource Strain: Low Risk  (07/04/2022)   Overall Financial Resource Strain (CARDIA)    Difficulty of Paying Living Expenses: Not hard at all  Food Insecurity: No Food Insecurity (07/04/2022)   Hunger Vital Sign    Worried About Radiation protection practitioner of Food in the Last Year: Never true    Ran Out of Food in the Last Year: Never true  Transportation Needs: No Transportation Needs (07/04/2022)   PRAPARE - Administrator, Civil Service (Medical): No    Lack of Transportation (Non-Medical): No  Physical Activity: Insufficiently Active (07/04/2022)   Exercise Vital Sign    Days of Exercise per Week: 3 days    Minutes of Exercise per Session: 40 min  Stress: Stress Concern Present (07/04/2022)   [employer] of Occupational Health - Occupational Stress Questionnaire  Feeling of Stress : To some extent  Social Connections: Moderately Isolated (07/04/2022)   Social Connection and Isolation Panel [NHANES]    Frequency of Communication with Friends and Family: Three times a week    Frequency of Social Gatherings with Friends and Family: Three times a week    Attends Religious Services: 1 to 4 times per year    Active Member of Clubs or Organizations: No    Attends Banker Meetings: Never    Marital Status: Widowed   Allergies  Allergen Reactions   Reyataz [Atazanavir Sulfate] Other (See Comments)    Jaundice   Morphine Nausea And Vomiting   Family History  Problem Relation Age of Onset   Leukemia Mother    Cancer Mother     Early death Mother    Miscarriages / [place] Mother    Varicose Veins Mother    Lung cancer Father    Cancer Father    Cancer Maternal Grandfather    Cancer Maternal Grandmother    Stroke Paternal Grandmother    Cancer Maternal Uncle     Current Outpatient Medications (Endocrine & Metabolic):    predniSONE (DELTASONE) 20 MG tablet, Take two tablet daily for the next 5 days.   Current Outpatient Medications (Respiratory):    montelukast (SINGULAIR) 10 MG tablet, Take 1 tablet (10 mg total) by mouth at bedtime.   Current Outpatient Medications (Hematological):    Cyanocobalamin (VITAMIN B 12 PO), Take by mouth.  Current Outpatient Medications (Other):    prednisoLONE acetate (PRED FORTE) 1 % ophthalmic suspension, Place 1 drop into the right eye 3 (three) times daily.   Turmeric 500 MG CAPS, Take 1 capsule by mouth daily.   VITAMIN D PO, Take 10,000 Units by mouth daily.   zolpidem (AMBIEN) 10 MG tablet, TAKE 1 TABLET BY MOUTH DAILY AS NEEDED   Reviewed prior external information including notes and imaging from  primary care provider As well as notes that were available from care everywhere and other healthcare systems.  Past medical history, social, surgical and family history all reviewed in electronic medical record.  No pertanent information unless stated regarding to the chief complaint.   Review of Systems:  No headache, visual changes, nausea, vomiting, diarrhea, constipation, dizziness, abdominal pain, skin rash, fevers, chills, night sweats, weight loss, swollen lymph nodes, body aches, joint swelling, chest pain, shortness of breath, mood changes. POSITIVE muscle aches  Objective  Blood pressure 120/68, pulse 85, height 5\' 7"  (1.702 m), SpO2 98%.   General: No apparent distress alert and oriented x3 mood and affect normal, dressed appropriately.  HEENT: Pupils equal, extraocular movements intact  Respiratory: Patient's speak in full sentences and does not  appear short of breath  Cardiovascular: No lower extremity edema, non tender, no erythema  Knee exam shows Patient does have some tenderness to palpation in the lateral aspect of the knee.  He does have crepitus noted.  He does have some instability with valgus and varus force.  After informed written and verbal consent, patient was seated on exam table. Left knee was prepped with alcohol swab and utilizing anterolateral approach, patient's left knee space was injected with 60 mg per 3 mL of Durolane (sodium hyaluronate) in a prefilled syringe was injected easily into the knee through a 22-gauge needle..Patient tolerated the procedure well without immediate complications.    Impression and Recommendations:    The above documentation has been reviewed and is accurate and complete Judi Saa, DO

## 2023-02-04 ENCOUNTER — Ambulatory Visit: Payer: No Typology Code available for payment source | Admitting: Family Medicine

## 2023-02-04 ENCOUNTER — Encounter: Payer: Self-pay | Admitting: Family Medicine

## 2023-02-04 VITALS — BP 120/68 | HR 85 | Ht 67.0 in

## 2023-02-04 DIAGNOSIS — M25562 Pain in left knee: Secondary | ICD-10-CM | POA: Diagnosis not present

## 2023-02-04 DIAGNOSIS — G8929 Other chronic pain: Secondary | ICD-10-CM | POA: Diagnosis not present

## 2023-02-04 DIAGNOSIS — M1712 Unilateral primary osteoarthritis, left knee: Secondary | ICD-10-CM

## 2023-02-04 MED ORDER — SODIUM HYALURONATE 60 MG/3ML IX PRSY
60.0000 mg | PREFILLED_SYRINGE | Freq: Once | INTRA_ARTICULAR | Status: AC
  Administered 2023-02-04: 60 mg via INTRA_ARTICULAR

## 2023-02-04 NOTE — Patient Instructions (Addendum)
Injection in knee today Good to see you! See you again in 3 months

## 2023-02-04 NOTE — Assessment & Plan Note (Signed)
Chronic with worsening symptoms  Discussed with patient about the viscosupplementation.  Discussed potential side effects.  We discussed which activities to do and which ones to avoid.  Will increase activity slowly.  Follow-up again in 12 weeks.  Continue to stay active

## 2023-02-24 ENCOUNTER — Other Ambulatory Visit: Payer: Self-pay | Admitting: Physician Assistant

## 2023-02-24 ENCOUNTER — Other Ambulatory Visit (HOSPITAL_COMMUNITY): Payer: Self-pay

## 2023-02-24 MED ORDER — MONTELUKAST SODIUM 10 MG PO TABS
10.0000 mg | ORAL_TABLET | Freq: Every day | ORAL | 3 refills | Status: AC
  Filled 2023-02-24: qty 30, 30d supply, fill #0
  Filled 2023-03-27: qty 30, 30d supply, fill #1
  Filled 2023-04-22: qty 30, 30d supply, fill #2
  Filled 2023-05-21: qty 30, 30d supply, fill #3

## 2023-02-25 ENCOUNTER — Other Ambulatory Visit (HOSPITAL_COMMUNITY): Payer: Self-pay

## 2023-03-27 ENCOUNTER — Other Ambulatory Visit (HOSPITAL_COMMUNITY): Payer: Self-pay

## 2023-04-22 ENCOUNTER — Other Ambulatory Visit: Payer: Self-pay

## 2023-05-07 ENCOUNTER — Ambulatory Visit: Payer: No Typology Code available for payment source | Admitting: Family Medicine

## 2023-05-22 ENCOUNTER — Other Ambulatory Visit (HOSPITAL_BASED_OUTPATIENT_CLINIC_OR_DEPARTMENT_OTHER): Payer: Self-pay

## 2023-05-22 ENCOUNTER — Other Ambulatory Visit: Payer: Self-pay

## 2023-05-22 ENCOUNTER — Other Ambulatory Visit (HOSPITAL_COMMUNITY): Payer: Self-pay

## 2023-07-07 DIAGNOSIS — Z947 Corneal transplant status: Secondary | ICD-10-CM | POA: Diagnosis not present

## 2023-07-07 DIAGNOSIS — Z9841 Cataract extraction status, right eye: Secondary | ICD-10-CM | POA: Diagnosis not present

## 2023-07-07 DIAGNOSIS — Z9842 Cataract extraction status, left eye: Secondary | ICD-10-CM | POA: Diagnosis not present

## 2023-07-10 ENCOUNTER — Ambulatory Visit (INDEPENDENT_AMBULATORY_CARE_PROVIDER_SITE_OTHER): Payer: Self-pay

## 2023-07-10 VITALS — Wt 172.0 lb

## 2023-07-10 DIAGNOSIS — Z1211 Encounter for screening for malignant neoplasm of colon: Secondary | ICD-10-CM | POA: Diagnosis not present

## 2023-07-10 DIAGNOSIS — Z1231 Encounter for screening mammogram for malignant neoplasm of breast: Secondary | ICD-10-CM

## 2023-07-10 DIAGNOSIS — Z0000 Encounter for general adult medical examination without abnormal findings: Secondary | ICD-10-CM | POA: Diagnosis not present

## 2023-07-10 NOTE — Patient Instructions (Signed)
 Sherry [name] , Thank you for taking time to come for your Medicare Wellness Visit. I appreciate your ongoing commitment to your health goals. Please review the following plan we discussed and let me know if I can assist you in the future.   Referrals/Orders/Follow-Ups/Clinician Recommendations: Aim for 30 minutes of exercise or brisk walking, 6-8 glasses of water, and 5 servings of fruits and vegetables each day.  This patient declined Interactive audio and acupuncturist. Therefore the visit was completed with audio only.  Vaccinations: declines all Influenza vaccine: recommend every Fall Pneumococcal vaccine: recommend once per lifetime Prevnar-20 Tdap vaccine: recommend every 10 years Shingles vaccine: recommend Shingrix which is 2 doses 2-6 months apart and over 90% effective     Covid-19: recommend 2 doses one month apart with a booster 6 months later A referral has been placed for a colonoscopy  [facility]  [phone]. [address] [place] [address] [place], [place] [address].  A referral for a mammogram has been placed   [facility]. [address] [place], [place] [address]. Get Driving Directions. Main: [phone].   This is a list of the screening recommended for you and due dates:  Health Maintenance  Topic Date Due   Colon Cancer Screening  09/18/2022   COVID-19 Vaccine (4 - 2024-25 season) 03/02/2023   Mammogram  04/05/2024   DTaP/Tdap/Td vaccine (3 - Td or Tdap) 10/04/2026   Pneumonia Vaccine  Completed   DEXA scan (bone density measurement)  Completed   Zoster (Shingles) Vaccine  Completed   HPV Vaccine  Aged Out   Flu Shot  Discontinued    Advanced directives: (Declined) Advance directive discussed with you today. Even though you declined this today, please call our office should you change your mind, and we can give you the proper paperwork for you to fill out.  Next Medicare Annual Wellness Visit scheduled for  next year: Yes

## 2023-07-10 NOTE — Progress Notes (Signed)
 Subjective:   Sherry [name] is a 68 y.o. female who presents for Medicare Annual (Subsequent) preventive examination.  Visit Complete: Virtual I connected with  [name] on 07/10/23 by a audio enabled telemedicine application and verified that I am speaking with the correct person using two identifiers.  Patient Location: Home  Provider Location: Office/Clinic  I discussed the limitations of evaluation and management by telemedicine. The patient expressed understanding and agreed to proceed.  Vital Signs: Because this visit was a virtual/telehealth visit, some criteria may be missing or patient reported. Any vitals not documented were not able to be obtained and vitals that have been documented are patient reported.   Cardiac Risk Factors include: advanced age (>20men, >36 women);dyslipidemia     Objective:    Today's Vitals   07/10/23 0958  Weight: 172 lb (78 kg)   Body mass index is 26.94 kg/m.     07/10/2023   10:03 AM 07/04/2022   10:38 AM 01/29/2019   11:11 AM 08/26/2011   12:46 PM  Advanced Directives  Does Patient Have a Medical Advance Directive? No No No Patient does not have advance directive  Would patient like information on creating a medical advance directive? No - Patient declined No - Patient declined No - Patient declined     Current Medications (verified) Outpatient Encounter Medications as of 07/10/2023  Medication Sig   BOOSTRIX 5-2.5-18.5 LF-MCG/0.5 injection    Cyanocobalamin  (VITAMIN B 12 PO) Take by mouth.   montelukast  (SINGULAIR ) 10 MG tablet Take 1 tablet (10 mg total) by mouth at bedtime.   prednisoLONE acetate (PRED FORTE) 1 % ophthalmic suspension Place 1 drop into the right eye 3 (three) times daily.   Turmeric 500 MG CAPS Take 1 capsule by mouth daily.   VITAMIN D  PO Take 10,000 Units by mouth daily.   zolpidem  (AMBIEN ) 10 MG tablet TAKE 1 TABLET BY MOUTH DAILY AS NEEDED   [DISCONTINUED] predniSONE  (DELTASONE ) 20 MG tablet  Take two tablet daily for the next 5 days.   No facility-administered encounter medications on file as of 07/10/2023.    Allergies (verified) Reyataz [atazanavir sulfate] and Morphine   History: Past Medical History:  Diagnosis Date   Allergy 2000   Arthritis 1982   Cataract 2022   Clotting disorder (HCC) bleed/bruise easily   Deviated septum    GERD (gastroesophageal reflux disease) 1958   Hepatitis    hx hep b 1984   Hyperlipemia    Hypothyroidism    Neuromuscular disorder (HCC) sciatic pain   PONV (postoperative nausea and vomiting)    SCCA (squamous cell carcinoma) of skin 07/04/2017   left nose tx cx3 64fu   Sinusitis, chronic    Tubular adenoma 10/09/2017   No high-grade dysplasia or malignancy    Past Surgical History:  Procedure Laterality Date   ANTERIOR CRUCIATE LIGAMENT REPAIR Right    HAND SURGERY Left    HERPES SIMPLEX VIRUS DFA     KNEE SURGERY Left    NASAL SEPTOPLASTY W/ TURBINOPLASTY  08/30/2011   Procedure: NASAL SEPTOPLASTY WITH TURBINATE REDUCTION;  Surgeon: Alm Bouche, MD;  Location: [facility];  Service: ENT;  Laterality: N/A;  nasal septoplasty, bil. inferior turbinated reduction, bil. endoscopic concha bullosa and anterior ethmoid and maxillary antrostomy, ethmoidectomy   NASAL SINUS SURGERY  08/30/2011   Procedure: ENDOSCOPIC SINUS SURGERY;  Surgeon: Alm Bouche, MD;  Location: [facility];  Service: ENT;  Laterality: N/A;   TUBAL LIGATION  Family History  Problem Relation Age of Onset   Leukemia Mother    Cancer Mother    Early death Mother    Miscarriages / Stillbirths Mother    Varicose Veins Mother    Lung cancer Father    Cancer Father    Cancer Maternal Grandfather    Cancer Maternal Grandmother    Stroke Paternal Grandmother    Cancer Maternal Uncle    Social History   Socioeconomic History   Marital status: Divorced    Spouse name: Not on file   Number of children: Not on file   Years  of education: Not on file   Highest education level: Not on file  Occupational History    Employer: [employer]  Tobacco Use   Smoking status: Never   Smokeless tobacco: Never   Tobacco comments:    none  Substance and Sexual Activity   Alcohol use: No   Drug use: No   Sexual activity: Never  Other Topics Concern   Not on file  Social History Narrative   Lives with oldest and youngest son   Social Drivers of Health   Financial Resource Strain: Low Risk  (07/10/2023)   Overall Financial Resource Strain (CARDIA)    Difficulty of Paying Living Expenses: Not hard at all  Food Insecurity: No Food Insecurity (07/10/2023)   Hunger Vital Sign    Worried About Running Out of Food in the Last Year: Never true    Ran Out of Food in the Last Year: Never true  Transportation Needs: No Transportation Needs (07/10/2023)   PRAPARE - Administrator, Civil Service (Medical): No    Lack of Transportation (Non-Medical): No  Physical Activity: Sufficiently Active (07/10/2023)   Exercise Vital Sign    Days of Exercise per Week: 5 days    Minutes of Exercise per Session: 30 min  Stress: No Stress Concern Present (07/10/2023)   [employer] of Occupational Health - Occupational Stress Questionnaire    Feeling of Stress : Not at all  Social Connections: Socially Isolated (07/10/2023)   Social Connection and Isolation Panel [NHANES]    Frequency of Communication with Friends and Family: More than three times a week    Frequency of Social Gatherings with Friends and Family: More than three times a week    Attends Religious Services: Never    Database Administrator or Organizations: No    Attends Engineer, Structural: Never    Marital Status: Divorced    Tobacco Counseling Counseling given: Not Answered Tobacco comments: none   Clinical Intake:  Pre-visit preparation completed: Yes  Pain : No/denies pain     BMI - recorded: 26.94 Nutritional Status: BMI 25 -29  Overweight Nutritional Risks: None Diabetes: No  How often do you need to have someone help you when you read instructions, pamphlets, or other written materials from your doctor or pharmacy?: 1 - Never  Interpreter Needed?: No  Information entered by :: Ellouise Haws, LPN   Activities of Daily Living    07/10/2023   10:00 AM  In your present state of health, do you have any difficulty performing the following activities:  Hearing? 0  Vision? 0  Difficulty concentrating or making decisions? 0  Walking or climbing stairs? 0  Dressing or bathing? 0  Doing errands, shopping? 0  Preparing Food and eating ? N  Using the Toilet? N  In the past six months, have you accidently leaked urine? Y  Comment  at times  Do you have problems with loss of bowel control? N  Comment at times  Managing your Medications? N  Managing your Finances? N  Housekeeping or managing your Housekeeping? N    Patient Care Team: Job Lukes, [name] as PCP - General (Physician Assistant) Kristie Lamprey, MD as Consulting Physician (Gastroenterology) Mable Lenis, MD (Inactive) as Consulting Physician (Otolaryngology) Claudene Arthea HERO, DO as Consulting Physician (Family Medicine)  Indicate any recent Medical Services you may have received from other than [facility] providers in the past year (date may be approximate).     Assessment:   This is a routine wellness examination for Chene.  Hearing/Vision screen Hearing Screening - Comments:: Pt denies any hearing issues  Vision Screening - Comments:: Pt follows up with dr smitty for annual eye exams    Goals Addressed             This Visit's Progress    Patient Stated       Maintain health and activity        Depression Screen    07/10/2023   10:02 AM 07/04/2022   10:36 AM 03/29/2022    9:42 AM 02/23/2021   11:04 AM 12/02/2018    1:26 PM 07/13/2018    2:03 PM 09/19/2017    2:07 PM  PHQ 2/9 Scores  PHQ - 2 Score 0 0 0 0 1 0 0  PHQ- 9 Score     7  3 3     Fall Risk    07/10/2023   10:04 AM 07/04/2022   10:39 AM 03/29/2022    9:41 AM 02/23/2021   11:04 AM 12/02/2018    1:21 PM  Fall Risk   Falls in the past year? 0 0 0 1 0  Number falls in past yr: 0 0 0 1 0  Injury with Fall? 0 0 0 1 0  Risk for fall due to : No Fall Risks Impaired vision  Other (Comment)   Risk for fall due to: Comment    stepped into a ground hog hole   Follow up Falls prevention discussed Falls prevention discussed Falls evaluation completed Falls evaluation completed     MEDICARE RISK AT HOME: Medicare Risk at Home Any stairs in or around the home?: Yes If so, are there any without handrails?: Yes Home free of loose throw rugs in walkways, pet beds, electrical cords, etc?: Yes Adequate lighting in your home to reduce risk of falls?: Yes Life alert?: No Use of a cane, walker or w/c?: No Grab bars in the bathroom?: Yes Shower chair or bench in shower?: No Elevated toilet seat or a handicapped toilet?: No  TIMED UP AND GO:  Was the test performed?  No    Cognitive Function:        07/10/2023   10:04 AM 07/04/2022   10:40 AM  6CIT Screen  What Year? 0 points 0 points  What month? 0 points 0 points  What time? 0 points 0 points  Count back from 20 0 points 0 points  Months in reverse 0 points 0 points  Repeat phrase 0 points 0 points  Total Score 0 points 0 points    Immunizations Immunization History  Administered Date(s) Administered   Fluad Quad(high Dose 65+) 03/23/2022   Influenza-Unspecified 03/01/2014, 03/01/2015   Moderna SARS-COV2 Booster Vaccination 04/24/2021   PFIZER(Purple Top)SARS-COV-2 Vaccination 06/22/2019, 07/13/2019, 03/11/2020   PNEUMOCOCCAL CONJUGATE-20 07/05/2021   Pneumococcal Conjugate-13 06/13/2020   Rsv, Mab, Nirsevimab-alip, 1 Ml, Neonate To  24 Mos(Beyfortus) 05/17/2022   Tdap 04/02/2005, 10/03/2016   Zoster Recombinant(Shingrix) 04/16/2021, 07/05/2021    TDAP status: Up to date  Flu Vaccine status: Declined,  Education has been provided regarding the importance of this vaccine but patient still declined. Advised may receive this vaccine at local pharmacy or Health Dept. Aware to provide a copy of the vaccination record if obtained from local pharmacy or Health Dept. Verbalized acceptance and understanding.  Pneumococcal vaccine status: Up to date  Covid-19 vaccine status: Information provided on how to obtain vaccines.   Qualifies for Shingles Vaccine? Yes   Zostavax completed Yes   Shingrix Completed?: Yes  Screening Tests Health Maintenance  Topic Date Due   Colonoscopy  09/18/2022   COVID-19 Vaccine (4 - 2024-25 season) 03/02/2023   MAMMOGRAM  04/05/2024   DTaP/Tdap/Td (3 - Td or Tdap) 10/04/2026   Pneumonia Vaccine 47+ Years old  Completed   DEXA SCAN  Completed   Zoster Vaccines- Shingrix  Completed   HPV VACCINES  Aged Out   INFLUENZA VACCINE  Discontinued    Health Maintenance  Health Maintenance Due  Topic Date Due   Colonoscopy  09/18/2022   COVID-19 Vaccine (4 - 2024-25 season) 03/02/2023    Colorectal cancer screening: Referral to GI placed 07/10/23. Pt aware the office will call re: appt.  Mammogram status: Ordered 07/10/23. Pt provided with contact info and advised to call to schedule appt.   Bone Density status: Completed 04/02/22. Results reflect: Bone density results: OSTEOPENIA. Repeat every 2 years.   Additional Screening:  Vision Screening: Recommended annual ophthalmology exams for early detection of glaucoma and other disorders of the eye. Is the patient up to date with their annual eye exam?  Yes  Who is the provider or what is the name of the office in which the patient attends annual eye exams? Dr Vannie  If pt is not established with a provider, would they like to be referred to a provider to establish care? No .   Dental Screening: Recommended annual dental exams for proper oral hygiene   Community Resource Referral / Chronic Care Management: CRR  required this visit?  No   CCM required this visit?  No     Plan:     I have personally reviewed and noted the following in the patient's chart:   Medical and social history Use of alcohol, tobacco or illicit drugs  Current medications and supplements including opioid prescriptions. Patient is not currently taking opioid prescriptions. Functional ability and status Nutritional status Physical activity Advanced directives List of other physicians Hospitalizations, surgeries, and ER visits in previous 12 months Vitals Screenings to include cognitive, depression, and falls Referrals and appointments  In addition, I have reviewed and discussed with patient certain preventive protocols, quality metrics, and best practice recommendations. A written personalized care plan for preventive services as well as general preventive health recommendations were provided to patient.     Ellouise VEAR Haws, LPN   8/0/7974   After Visit Summary: (MyChart) Due to this being a telephonic visit, the after visit summary with patients personalized plan was offered to patient via MyChart   Nurse Notes: pt prefers telephonic AWV

## 2023-07-14 NOTE — Progress Notes (Deleted)
 [name] [address] [place] [address] Phone: [phone] Subjective:    I'm seeing this patient by the request  of:  Alexander Iba, [name]  CC:   [id number]  02/04/2023 Chronic with worsening symptoms  Discussed with patient about the viscosupplementation.  Discussed potential side effects.  We discussed which activities to do and which ones to avoid.  Will increase activity slowly.  Follow-up again in 12 weeks.  Continue to stay active      Update 07/16/2023 Sherry [name] [id number] is a 68 y.o. female coming in with complaint of L knee pain. Patient states        Past Medical History:  Diagnosis Date   Allergy 2000   Arthritis 1982   Cataract 2022   Clotting disorder (HCC) bleed/bruise easily   Deviated septum    GERD (gastroesophageal reflux disease) 1958   Hepatitis    hx hep b 1984   Hyperlipemia    Hypothyroidism    Neuromuscular disorder (HCC) sciatic pain   PONV (postoperative nausea and vomiting)    SCCA (squamous cell carcinoma) of skin 07/04/2017   left nose tx cx3 15fu   Sinusitis, chronic    Tubular adenoma 10/09/2017   No high-grade dysplasia or malignancy    Past Surgical History:  Procedure Laterality Date   ANTERIOR CRUCIATE LIGAMENT REPAIR Right    HAND SURGERY Left    HERPES SIMPLEX VIRUS DFA     KNEE SURGERY Left    NASAL SEPTOPLASTY W/ TURBINOPLASTY  08/30/2011   Procedure: NASAL SEPTOPLASTY WITH TURBINATE REDUCTION;  Surgeon: Ammon Bales, MD;  Location: [facility];  Service: ENT;  Laterality: N/A;  nasal septoplasty, bil. inferior turbinated reduction, bil. endoscopic concha bullosa and anterior ethmoid and maxillary antrostomy, ethmoidectomy   NASAL SINUS SURGERY  08/30/2011   Procedure: ENDOSCOPIC SINUS SURGERY;  Surgeon: Ammon Bales, MD;  Location: [facility];  Service: ENT;  Laterality: N/A;   TUBAL LIGATION     Social History   Socioeconomic History    Marital status: Divorced    Spouse name: Not on file   Number of children: Not on file   Years of education: Not on file   Highest education level: Not on file  Occupational History    Employer: [employer]  Tobacco Use   Smoking status: Never   Smokeless tobacco: Never   Tobacco comments:    none  Substance and Sexual Activity   Alcohol use: No   Drug use: No   Sexual activity: Never  Other Topics Concern   Not on file  Social History Narrative   Lives with oldest and youngest son   Social Drivers of Corporate investment banker Strain: Low Risk  (07/10/2023)   Overall Financial Resource Strain (CARDIA)    Difficulty of Paying Living Expenses: Not hard at all  Food Insecurity: No Food Insecurity (07/10/2023)   Hunger Vital Sign    Worried About Radiation protection practitioner of Food in the Last Year: Never true    Ran Out of Food in the Last Year: Never true  Transportation Needs: No Transportation Needs (07/10/2023)   PRAPARE - Administrator, Civil Service (Medical): No    Lack of Transportation (Non-Medical): No  Physical Activity: Sufficiently Active (07/10/2023)   Exercise Vital Sign    Days of Exercise per Week: 5 days    Minutes of Exercise per Session: 30 min  Stress: No Stress Concern Present (07/10/2023)  [employer] of Occupational Health - Occupational Stress Questionnaire    Feeling of Stress : Not at all  Social Connections: Socially Isolated (07/10/2023)   Social Connection and Isolation Panel [NHANES]    Frequency of Communication with Friends and Family: More than three times a week    Frequency of Social Gatherings with Friends and Family: More than three times a week    Attends Religious Services: Never    Database administrator or Organizations: No    Attends Engineer, structural: Never    Marital Status: Divorced   Allergies  Allergen Reactions   Reyataz Lobbyist Sulfate] Other (See Comments)    Jaundice   Morphine Nausea And Vomiting    Family History  Problem Relation Age of Onset   Leukemia Mother    Cancer Mother    Early death Mother    Miscarriages / Stillbirths Mother    Varicose Veins Mother    Lung cancer Father    Cancer Father    Cancer Maternal Grandfather    Cancer Maternal Grandmother    Stroke Paternal Grandmother    Cancer Maternal Uncle       Current Outpatient Medications (Respiratory):    montelukast  (SINGULAIR ) 10 MG tablet, Take 1 tablet (10 mg total) by mouth at bedtime.   Current Outpatient Medications (Hematological):    Cyanocobalamin  (VITAMIN B 12 PO), Take by mouth.  Current Outpatient Medications (Other):    BOOSTRIX 5-2.5-18.5 LF-MCG/0.5 injection,    prednisoLONE acetate (PRED FORTE) 1 % ophthalmic suspension, Place 1 drop into the right eye 3 (three) times daily.   Turmeric 500 MG CAPS, Take 1 capsule by mouth daily.   VITAMIN D  PO, Take 10,000 Units by mouth daily.   zolpidem  (AMBIEN ) 10 MG tablet, TAKE 1 TABLET BY MOUTH DAILY AS NEEDED   Reviewed prior external information including notes and imaging from  primary care provider As well as notes that were available from care everywhere and other healthcare systems.  Past medical history, social, surgical and family history all reviewed in electronic medical record.  No pertanent information unless stated regarding to the chief complaint.   Review of Systems:  No headache, visual changes, nausea, vomiting, diarrhea, constipation, dizziness, abdominal pain, skin rash, fevers, chills, night sweats, weight loss, swollen lymph nodes, body aches, joint swelling, chest pain, shortness of breath, mood changes. POSITIVE muscle aches  Objective  There were no vitals taken for this visit.   General: No apparent distress alert and oriented x3 mood and affect normal, dressed appropriately.  HEENT: Pupils equal, extraocular movements intact  Respiratory: Patient's speak in full sentences and does not appear short of breath   Cardiovascular: No lower extremity edema, non tender, no erythema      Impression and Recommendations:

## 2023-07-16 ENCOUNTER — Ambulatory Visit: Payer: No Typology Code available for payment source | Admitting: Family Medicine

## 2023-07-22 ENCOUNTER — Telehealth: Payer: Self-pay | Admitting: Gastroenterology

## 2023-07-22 NOTE — Telephone Encounter (Signed)
Good afternoon Dr. Lavon Paganini   We received a referral for this patient to be scheduled for a colonoscopy. Patient last had colonoscopy in 08/2017 with Dr. Loreta Ave at [facility]. Patient states her sister is your patient and she would like to schedule colonoscopy with you. Records are in Epic for you to review. Would you please advise on scheduling?  Thank you.

## 2023-07-22 NOTE — Telephone Encounter (Signed)
Please schedule next available appointment for direct colonoscopy if patient has no symptoms, history of adenomatous colon polyps.  Thank you

## 2023-08-05 ENCOUNTER — Ambulatory Visit
Admission: RE | Admit: 2023-08-05 | Discharge: 2023-08-05 | Disposition: A | Discharge status: Home / Self Care | Payer: HMO | Source: Ambulatory Visit | Attending: Physician Assistant | Admitting: Physician Assistant

## 2023-08-05 DIAGNOSIS — Z1231 Encounter for screening mammogram for malignant neoplasm of breast: Secondary | ICD-10-CM

## 2023-08-07 NOTE — Telephone Encounter (Signed)
 Called and left voicemail for patient to call back and schedule colonoscopy.

## 2023-08-26 ENCOUNTER — Encounter: Payer: Self-pay | Admitting: Gastroenterology

## 2023-09-09 DIAGNOSIS — Z129 Encounter for screening for malignant neoplasm, site unspecified: Secondary | ICD-10-CM | POA: Diagnosis not present

## 2023-09-09 DIAGNOSIS — L821 Other seborrheic keratosis: Secondary | ICD-10-CM | POA: Diagnosis not present

## 2023-09-09 DIAGNOSIS — L568 Other specified acute skin changes due to ultraviolet radiation: Secondary | ICD-10-CM | POA: Diagnosis not present

## 2023-09-09 DIAGNOSIS — L570 Actinic keratosis: Secondary | ICD-10-CM | POA: Diagnosis not present

## 2023-09-09 DIAGNOSIS — D225 Melanocytic nevi of trunk: Secondary | ICD-10-CM | POA: Diagnosis not present

## 2023-09-09 DIAGNOSIS — L814 Other melanin hyperpigmentation: Secondary | ICD-10-CM | POA: Diagnosis not present

## 2023-09-09 NOTE — Progress Notes (Unsigned)
 [name] [address] [place] [address] Phone: [phone] Subjective:   Sherry [name] am a scribe for Dr. Katrinka Blazing.   I'm seeing this patient by the request  of:  Jarold Motto, [name]  CC: Left knee pain follow-up  [id number]  02/04/2023 Chronic with worsening symptoms  Discussed with patient about the viscosupplementation.  Discussed potential side effects.  We discussed which activities to do and which ones to avoid.  Will increase activity slowly.  Follow-up again in 12 weeks.  Continue to stay active      Update 09/10/2023 Sherry [name] [id number] is a 68 y.o. female coming in with complaint of L knee pain. Patient states the knee is ok today. It comes and goes. Today is a good day so far. Monday it was a bit swollen.       Past Medical History:  Diagnosis Date   Allergy 2000   Arthritis 1982   Cataract 2022   Clotting disorder (HCC) bleed/bruise easily   Deviated septum    GERD (gastroesophageal reflux disease) 1958   Hepatitis    hx hep b 1984   Hyperlipemia    Hypothyroidism    Neuromuscular disorder (HCC) sciatic pain   PONV (postoperative nausea and vomiting)    SCCA (squamous cell carcinoma) of skin 07/04/2017   left nose tx cx3 53fu   Sinusitis, chronic    Tubular adenoma 10/09/2017   No high-grade dysplasia or malignancy    Past Surgical History:  Procedure Laterality Date   ANTERIOR CRUCIATE LIGAMENT REPAIR Right    HAND SURGERY Left    HERPES SIMPLEX VIRUS DFA     KNEE SURGERY Left    NASAL SEPTOPLASTY W/ TURBINOPLASTY  08/30/2011   Procedure: NASAL SEPTOPLASTY WITH TURBINATE REDUCTION;  Surgeon: Osborn Coho, MD;  Location: [facility];  Service: ENT;  Laterality: N/A;  nasal septoplasty, bil. inferior turbinated reduction, bil. endoscopic concha bullosa and anterior ethmoid and maxillary antrostomy, ethmoidectomy   NASAL SINUS SURGERY  08/30/2011   Procedure: ENDOSCOPIC SINUS SURGERY;   Surgeon: Osborn Coho, MD;  Location: [facility];  Service: ENT;  Laterality: N/A;   TUBAL LIGATION     Social History   Socioeconomic History   Marital status: Divorced    Spouse name: Not on file   Number of children: Not on file   Years of education: Not on file   Highest education level: Not on file  Occupational History    Employer: [employer]  Tobacco Use   Smoking status: Never   Smokeless tobacco: Never   Tobacco comments:    none  Substance and Sexual Activity   Alcohol use: No   Drug use: No   Sexual activity: Never  Other Topics Concern   Not on file  Social History Narrative   Lives with oldest and youngest son   Social Drivers of Corporate investment banker Strain: Low Risk  (07/10/2023)   Overall Financial Resource Strain (CARDIA)    Difficulty of Paying Living Expenses: Not hard at all  Food Insecurity: No Food Insecurity (07/10/2023)   Hunger Vital Sign    Worried About Radiation protection practitioner of Food in the Last Year: Never true    Ran Out of Food in the Last Year: Never true  Transportation Needs: No Transportation Needs (07/10/2023)   PRAPARE - Administrator, Civil Service (Medical): No    Lack of Transportation (Non-Medical): No  Physical Activity: Sufficiently  Active (07/10/2023)   Exercise Vital Sign    Days of Exercise per Week: 5 days    Minutes of Exercise per Session: 30 min  Stress: No Stress Concern Present (07/10/2023)   [employer] of Occupational Health - Occupational Stress Questionnaire    Feeling of Stress : Not at all  Social Connections: Socially Isolated (07/10/2023)   Social Connection and Isolation Panel [NHANES]    Frequency of Communication with Friends and Family: More than three times a week    Frequency of Social Gatherings with Friends and Family: More than three times a week    Attends Religious Services: Never    Database administrator or Organizations: No    Attends Engineer, structural:  Never    Marital Status: Divorced   Allergies  Allergen Reactions   Reyataz Lobbyist Sulfate] Other (See Comments)    Jaundice   Morphine Nausea And Vomiting   Family History  Problem Relation Age of Onset   Leukemia Mother    Cancer Mother    Early death Mother    Miscarriages / Stillbirths Mother    Varicose Veins Mother    Lung cancer Father    Cancer Father    Cancer Maternal Grandfather    Cancer Maternal Grandmother    Stroke Paternal Grandmother    Cancer Maternal Uncle       Current Outpatient Medications (Respiratory):    montelukast (SINGULAIR) 10 MG tablet, Take 1 tablet (10 mg total) by mouth at bedtime.   Current Outpatient Medications (Hematological):    Cyanocobalamin (VITAMIN B 12 PO), Take by mouth.  Current Outpatient Medications (Other):    BOOSTRIX 5-2.5-18.5 LF-MCG/0.5 injection,    prednisoLONE acetate (PRED FORTE) 1 % ophthalmic suspension, Place 1 drop into the right eye 3 (three) times daily.   Turmeric 500 MG CAPS, Take 1 capsule by mouth daily.   VITAMIN D PO, Take 10,000 Units by mouth daily.   zolpidem (AMBIEN) 10 MG tablet, TAKE 1 TABLET BY MOUTH DAILY AS NEEDED   Reviewed prior external information including notes and imaging from  primary care provider As well as notes that were available from care everywhere and other healthcare systems.  Past medical history, social, surgical and family history all reviewed in electronic medical record.  No pertanent information unless stated regarding to the chief complaint.   Review of Systems:  No headache, visual changes, nausea, vomiting, diarrhea, constipation, dizziness, abdominal pain, skin rash, fevers, chills, night sweats, weight loss, swollen lymph nodes,  chest pain, shortness of breath, mood changes. POSITIVE muscle aches, joint swelling, body aches  Objective  Blood pressure (!) 142/70, pulse 69, height 5\' 7"  (1.702 m), SpO2 98%.   General: No apparent distress alert and oriented  x3 mood and affect normal, dressed appropriately.  HEENT: Pupils equal, extraocular movements intact  Respiratory: Patient's speak in full sentences and does not appear short of breath  Cardiovascular: No lower extremity edema, non tender, no erythema  Knee exam shows patient does have some crepitus noted.  Left knee does have significant swelling noted.  After informed written and verbal consent, patient was seated on exam table. Left knee was prepped with alcohol swab and utilizing anterolateral approach, patient's left knee space was injected with 4:1  marcaine 0.5%: Kenalog 40mg /dL. Patient tolerated the procedure well without immediate complications.    Impression and Recommendations:    The above documentation has been reviewed and is accurate and complete Judi Saa, DO

## 2023-09-10 ENCOUNTER — Ambulatory Visit: Payer: No Typology Code available for payment source | Admitting: Family Medicine

## 2023-09-10 ENCOUNTER — Encounter: Payer: Self-pay | Admitting: Family Medicine

## 2023-09-10 VITALS — BP 142/70 | HR 69 | Ht 67.0 in

## 2023-09-10 DIAGNOSIS — M25511 Pain in right shoulder: Secondary | ICD-10-CM

## 2023-09-10 DIAGNOSIS — M1712 Unilateral primary osteoarthritis, left knee: Secondary | ICD-10-CM | POA: Diagnosis not present

## 2023-09-10 NOTE — Patient Instructions (Addendum)
 Good to see you. Injected L knee today. Scapular HEP.  Return in 3 months.

## 2023-09-10 NOTE — Assessment & Plan Note (Signed)
 Has had trigger points of the right shoulder previously.  Will monitor.  If worsening symptoms consider injections again.  Discussed icing regimen.  Has attempted osteopathic manipulation previously as well and we can monitor that as well.  Discussed icing regimen and home exercises.  Follow-up again in 6 to 8 weeks

## 2023-09-10 NOTE — Assessment & Plan Note (Signed)
 Patient given injection and tolerated the procedure well, chronic problem with worsening symptoms.  Known arthritic changes.  No worsening pain could potentially consider the possibility of viscosupplementation.  Patient is in agreement with this.  Discussed icing regimen and home exercises.  Follow-up again in 6 to 8 weeks.

## 2023-10-09 ENCOUNTER — Encounter: Payer: HMO | Admitting: Gastroenterology

## 2023-10-13 ENCOUNTER — Ambulatory Visit: Payer: HMO | Admitting: Gastroenterology

## 2023-10-13 ENCOUNTER — Encounter: Payer: Self-pay | Admitting: Gastroenterology

## 2023-10-13 VITALS — BP 130/70 | HR 86 | Ht 67.0 in | Wt 191.0 lb

## 2023-10-13 DIAGNOSIS — K529 Noninfective gastroenteritis and colitis, unspecified: Secondary | ICD-10-CM

## 2023-10-13 DIAGNOSIS — R159 Full incontinence of feces: Secondary | ICD-10-CM

## 2023-10-13 DIAGNOSIS — K219 Gastro-esophageal reflux disease without esophagitis: Secondary | ICD-10-CM

## 2023-10-13 DIAGNOSIS — Z860101 Personal history of adenomatous and serrated colon polyps: Secondary | ICD-10-CM | POA: Diagnosis not present

## 2023-10-13 DIAGNOSIS — Z860100 Personal history of colon polyps, unspecified: Secondary | ICD-10-CM

## 2023-10-13 MED ORDER — NA SULFATE-K SULFATE-MG SULF 17.5-3.13-1.6 GM/177ML PO SOLN: 1.0000 | Freq: Once | ORAL | 0 refills | Status: AC

## 2023-10-13 NOTE — Progress Notes (Signed)
 10/13/2023 Sherry [name] [id number] 10/07/1955   HISTORY OF PRESENT ILLNESS: This is a 68 year old female who is new to our office.  Her sister is a patient of Dr. Lavon Paganini so she is requesting for her to be her physician as well.  Patient has a history with Dr. Loreta Ave.  Colonoscopy in March 2019 showed a 9 mm polyp that was removed.  It was a tubular adenoma and repeat was recommended in 5 years.  She would like to schedule colonoscopy.  She tells me that she had had chronic diarrhea for years.  Starting this year around January things have improved somewhat, stool still soft, but not diarrhea as they had previously.  She denies seeing any blood in her stool.  She also notices some fecal leakage at times.  Says she will clean well and then when she is back to the bathroom she will notice some stool there again.  Tells me that she has done pelvic floor physical therapy in the past and she did not like it.  Says that she tried Metamucil wafers previously and they did not seem to help.  She would also like to discuss and schedule EGD if appropriate.  She has longstanding history of intermittent GERD.  She is never taken anything on a regular basis.  Currently using Pepcid AC as needed.  Had an EGD with Dr. Loreta Ave in March 2019 as well that was completely normal, no specimens collected.  Says her sister was diagnosed with Barrett's esophagus recently.  Notices reflux symptoms more frequent as of the last few months, she says that maybe about once a week or so when previously it was much more random and sporadic than that.  Referred here by Jarold Motto, PA, for colon cancer screening.   Past Medical History:  Diagnosis Date   Allergy 2000   Arthritis 1982   Cataract 2022   Clotting disorder (HCC) bleed/bruise easily   Deviated septum    GERD (gastroesophageal reflux disease) 1958   Hepatitis    hx hep b 1984   Hyperlipemia    Hypothyroidism    Neuromuscular disorder (HCC) sciatic pain    PONV (postoperative nausea and vomiting)    SCCA (squamous cell carcinoma) of skin 07/04/2017   left nose tx cx3 1fu   Sinusitis, chronic    Tubular adenoma 10/09/2017   No high-grade dysplasia or malignancy    Past Surgical History:  Procedure Laterality Date   ANTERIOR CRUCIATE LIGAMENT REPAIR Right    HAND SURGERY Left    HERPES SIMPLEX VIRUS DFA     KNEE SURGERY Left    NASAL SEPTOPLASTY W/ TURBINOPLASTY  08/30/2011   Procedure: NASAL SEPTOPLASTY WITH TURBINATE REDUCTION;  Surgeon: Osborn Coho, MD;  Location: [facility];  Service: ENT;  Laterality: N/A;  nasal septoplasty, bil. inferior turbinated reduction, bil. endoscopic concha bullosa and anterior ethmoid and maxillary antrostomy, ethmoidectomy   NASAL SINUS SURGERY  08/30/2011   Procedure: ENDOSCOPIC SINUS SURGERY;  Surgeon: Osborn Coho, MD;  Location: [facility];  Service: ENT;  Laterality: N/A;   TUBAL LIGATION      reports that she has never smoked. She has never used smokeless tobacco. She reports that she does not drink alcohol and does not use drugs. family history includes Cancer in her father, maternal grandfather, maternal grandmother, maternal uncle, and mother; Early death in her mother; Leukemia in her mother; Lung cancer in her father; Miscarriages / [place] in her mother; Stroke  in her paternal grandmother; Varicose Veins in her mother. Allergies  Allergen Reactions   Reyataz [Atazanavir Sulfate] Other (See Comments)    Jaundice   Morphine Nausea And Vomiting      Outpatient Encounter Medications as of 10/13/2023  Medication Sig   BOOSTRIX 5-2.5-18.5 LF-MCG/0.5 injection    Cyanocobalamin (VITAMIN B 12 PO) Take by mouth.   montelukast (SINGULAIR) 10 MG tablet Take 1 tablet (10 mg total) by mouth at bedtime.   prednisoLONE acetate (PRED FORTE) 1 % ophthalmic suspension Place 1 drop into the right eye 3 (three) times daily.   Turmeric 500 MG CAPS Take 1 capsule by  mouth daily.   VITAMIN D PO Take 10,000 Units by mouth daily.   zolpidem (AMBIEN) 10 MG tablet TAKE 1 TABLET BY MOUTH DAILY AS NEEDED   No facility-administered encounter medications on file as of 10/13/2023.    REVIEW OF SYSTEMS  : All other systems reviewed and negative except where noted in the History of Present Illness.   PHYSICAL EXAM: BP 130/70   Pulse 86   Ht 5\' 7"  (1.702 m)   Wt 191 lb (86.6 kg)   BMI 29.91 kg/m  General: Well developed white female in no acute distress Head: Normocephalic and atraumatic Eyes:  Sclerae anicteric, conjunctiva pink. Ears: Normal auditory acuity Lungs: Clear throughout to auscultation; no W/R/R. Heart: Regular rate and rhythm; no M/R/G. Rectal:  Will be done at the time of colonoscopy. Musculoskeletal: Symmetrical with no gross deformities  Skin: No lesions on visible extremities Neurological: Alert oriented x 4, grossly non-focal Psychological:  Alert and cooperative. Normal mood and affect  ASSESSMENT AND PLAN: *Personal history of colon polyps: Had a 9 mm polyp removed on colonoscopy in March 2019, tubular adenoma.  Was due for 5-year recall.  Will schedule with Dr. Nandigam. *Chronic diarrhea: Says that this was an issue for years, starting this year though seems that stools have solidified some, still soft.  Please consider random biopsies to rule out microscopic colitis just in case symptoms are current. *Fecal leakage: Likely due to pelvic floor dysfunction/decreased sphincter tone.  Says that she has tried pelvic floor physical therapy before and she did not like it.  Tried Metamucil wafers in the past.  Suggested trying Benefiber powder starting with 2 teaspoons mixed in 8 ounces of liquid daily to try to help with bulking the stool and more completely eliminating from the rectum. *GERD: Reports having intermittent GERD for years, not on any medication regularly, uses pepcid AC prn.  Historically symptoms occurred randomly, now somewhat  more frequent maybe once a week or so.  Sister diagnosed with Barrett's.  Patient had an EGD in March 2019 that was unremarkable.  Will plan for EGD with Dr. Nandigam as well.  **The risks, benefits, and alternatives to EGD and colonoscopy were discussed with the patient and she consents to proceed.   CC:  Alexander Iba, [name]

## 2023-10-13 NOTE — Patient Instructions (Signed)
 You have been scheduled for an endoscopy and colonoscopy. Please follow the written instructions given to you at your visit today.  If you use inhalers (even only as needed), please bring them with you on the day of your procedure.  DO NOT TAKE 7 DAYS PRIOR TO TEST- Trulicity (dulaglutide) Ozempic, Wegovy (semaglutide) Mounjaro (tirzepatide) Bydureon Bcise (exanatide extended release)  DO NOT TAKE 1 DAY PRIOR TO YOUR TEST Rybelsus (semaglutide) Adlyxin (lixisenatide) Victoza (liraglutide) Byetta (exanatide) _______________________________________________________  If your blood pressure at your visit was 140/90 or greater, please contact your primary care physician to follow up on this.  _______________________________________________________  If you are age 61 or older, your body mass index should be between 23-30. Your Body mass index is 29.91 kg/m. If this is out of the aforementioned range listed, please consider follow up with your Primary Care Provider.  If you are age 71 or younger, your body mass index should be between 19-25. Your Body mass index is 29.91 kg/m. If this is out of the aformentioned range listed, please consider follow up with your Primary Care Provider.   ________________________________________________________  The [facility] providers would like to encourage you to use MYCHART to communicate with providers for non-urgent requests or questions.  Due to long hold times on the telephone, sending your provider a message by [facility] may be a faster and more efficient way to get a response.  Please allow 48 business hours for a response.  Please remember that this is for non-urgent requests.  _______________________________________________________

## 2023-11-01 ENCOUNTER — Encounter: Payer: Self-pay | Admitting: Gastroenterology

## 2023-11-05 ENCOUNTER — Encounter (HOSPITAL_COMMUNITY): Payer: Self-pay

## 2023-11-05 ENCOUNTER — Encounter: Payer: Self-pay | Admitting: Gastroenterology

## 2023-11-11 DIAGNOSIS — L568 Other specified acute skin changes due to ultraviolet radiation: Secondary | ICD-10-CM | POA: Diagnosis not present

## 2023-11-12 ENCOUNTER — Other Ambulatory Visit: Payer: Self-pay

## 2023-11-12 ENCOUNTER — Encounter: Payer: Self-pay | Admitting: Gastroenterology

## 2023-11-12 ENCOUNTER — Ambulatory Visit: Admitting: Gastroenterology

## 2023-11-12 ENCOUNTER — Other Ambulatory Visit (HOSPITAL_COMMUNITY): Payer: Self-pay

## 2023-11-12 VITALS — BP 140/70 | HR 62 | Temp 98.1°F | Resp 18 | Ht 67.0 in | Wt 191.0 lb

## 2023-11-12 DIAGNOSIS — Z860100 Personal history of colon polyps, unspecified: Secondary | ICD-10-CM | POA: Diagnosis not present

## 2023-11-12 DIAGNOSIS — K635 Polyp of colon: Secondary | ICD-10-CM

## 2023-11-12 DIAGNOSIS — K648 Other hemorrhoids: Secondary | ICD-10-CM | POA: Diagnosis not present

## 2023-11-12 DIAGNOSIS — E785 Hyperlipidemia, unspecified: Secondary | ICD-10-CM | POA: Diagnosis not present

## 2023-11-12 DIAGNOSIS — D122 Benign neoplasm of ascending colon: Secondary | ICD-10-CM

## 2023-11-12 DIAGNOSIS — K644 Residual hemorrhoidal skin tags: Secondary | ICD-10-CM | POA: Diagnosis not present

## 2023-11-12 DIAGNOSIS — K2950 Unspecified chronic gastritis without bleeding: Secondary | ICD-10-CM

## 2023-11-12 DIAGNOSIS — K219 Gastro-esophageal reflux disease without esophagitis: Secondary | ICD-10-CM | POA: Diagnosis not present

## 2023-11-12 DIAGNOSIS — K2970 Gastritis, unspecified, without bleeding: Secondary | ICD-10-CM | POA: Diagnosis not present

## 2023-11-12 DIAGNOSIS — D123 Benign neoplasm of transverse colon: Secondary | ICD-10-CM | POA: Diagnosis not present

## 2023-11-12 DIAGNOSIS — Z1211 Encounter for screening for malignant neoplasm of colon: Secondary | ICD-10-CM

## 2023-11-12 DIAGNOSIS — E039 Hypothyroidism, unspecified: Secondary | ICD-10-CM | POA: Diagnosis not present

## 2023-11-12 MED ORDER — SODIUM CHLORIDE 0.9 % IV SOLN: 500.0000 mL | Freq: Once | INTRAVENOUS | Status: DC

## 2023-11-12 MED ORDER — PANTOPRAZOLE SODIUM 40 MG PO TBEC
40.0000 mg | DELAYED_RELEASE_TABLET | Freq: Every day | ORAL | 3 refills | Status: AC
  Filled 2023-11-12: qty 90, 90d supply, fill #0
  Filled 2024-02-02: qty 90, 90d supply, fill #1
  Filled 2024-06-08: qty 90, 90d supply, fill #2

## 2023-11-12 NOTE — Op Note (Signed)
 [facility] Patient Name: Sherry [name] Procedure Date: 11/12/2023 2:14 PM MRN: [id number] Endoscopist: Sergio Dandy , MD, [id number] Age: 68 Referring MD:  Date of Birth: 1955/07/04 Gender: Female Account #: [id number] Procedure:                Upper GI endoscopy Indications:              Suspected reflux esophagitis Medicines:                Monitored Anesthesia Care Procedure:                Pre-Anesthesia Assessment:                           - Prior to the procedure, a History and Physical                            was performed, and patient medications and                            allergies were reviewed. The patient's tolerance of                            previous anesthesia was also reviewed. The risks                            and benefits of the procedure and the sedation                            options and risks were discussed with the patient.                            All questions were answered, and informed consent                            was obtained. Prior Anticoagulants: The patient has                            taken no anticoagulant or antiplatelet agents. ASA                            Grade Assessment: II - A patient with mild systemic                            disease. After reviewing the risks and benefits,                            the patient was deemed in satisfactory condition to                            undergo the procedure.                           After obtaining informed consent, the endoscope was  passed under direct vision. Throughout the                            procedure, the patient's blood pressure, pulse, and                            oxygen saturations were monitored continuously. The                            Olympus Scope [id number] was introduced through the                            mouth, and advanced to the second part of duodenum.                            The upper GI  endoscopy was accomplished without                            difficulty. The patient tolerated the procedure                            well. Scope In: Scope Out: Findings:                 Two, less than 5 mm mucosal nodules with a                            localized distribution were found in the middle                            third of the esophagus, 32 cm from the incisors.                            The nodules were Paris classification Is                            (protruding, sessile). Biopsies were taken with a                            cold forceps for histology.                           The gastroesophageal flap valve was visualized                            endoscopically and classified as Hill Grade III                            (minimal fold, loose to endoscope, hiatal hernia                            likely).                           Patchy mild inflammation characterized by  congestion (edema), erythema and friability was                            found in the entire examined stomach. Biopsies were                            taken with a cold forceps for histology. Biopsies                            were taken with a cold forceps for Helicobacter                            pylori testing.                           The cardia and gastric fundus were normal on                            retroflexion.                           The examined duodenum was normal. Complications:            No immediate complications. Estimated Blood Loss:     Estimated blood loss was minimal. Impression:               - Mucosal nodule found in the esophagus. Biopsied.                           - Gastroesophageal flap valve classified as Hill                            Grade III (minimal fold, loose to endoscope, hiatal                            hernia likely).                           - Gastritis. Biopsied.                           - Normal examined  duodenum. Recommendation:           - Patient has a contact number available for                            emergencies. The signs and symptoms of potential                            delayed complications were discussed with the                            patient. Return to normal activities tomorrow.                            Written discharge instructions were provided to the  patient.                           - Resume previous diet.                           - Continue present medications.                           - Await pathology results.                           - Follow an antireflux regimen.                           - Use Protonix (pantoprazole) 40 mg PO daily. Rx                            for 90 days with 3 refills. Thanks Keylin Podolsky V. Ozie Lupe, MD 11/12/2023 3:09:10 PM This report has been signed electronically.

## 2023-11-12 NOTE — Patient Instructions (Addendum)
 - 3 polyps removed and sent to pathology  - Hemorrhoids                   - Resume previous diet. - Continue present medications. - Follow an antireflux regimen. - Use Protonix (pantoprazole) 40 mg PO daily. Rx    for 90 days with 3 refills. Thanks - Await pathology results. - Repeat colonoscopy in 5 years for surveillance  based on pathology results.  YOU HAD AN ENDOSCOPIC PROCEDURE TODAY AT THE [facility]:   Refer to the procedure report that was given to you for any specific questions about what was found during the examination.  If the procedure report does not answer your questions, please call your gastroenterologist to clarify.  If you requested that your care partner not be given the details of your procedure findings, then the procedure report has been included in a sealed envelope for you to review at your convenience later.  YOU SHOULD EXPECT: Some feelings of bloating in the abdomen. Passage of more gas than usual.  Walking can help get rid of the air that was put into your GI tract during the procedure and reduce the bloating. If you had a lower endoscopy (such as a colonoscopy or flexible sigmoidoscopy) you may notice spotting of blood in your stool or on the toilet paper. If you underwent a bowel prep for your procedure, you may not have a normal bowel movement for a few days.  Please Note:  You might notice some irritation and congestion in your nose or some drainage.  This is from the oxygen used during your procedure.  There is no need for concern and it should clear up in a day or so.  SYMPTOMS TO REPORT IMMEDIATELY:  Following lower endoscopy (colonoscopy or flexible sigmoidoscopy):  Excessive amounts of blood in the stool  Significant tenderness or worsening of abdominal pains  Swelling of the abdomen that is new, acute  Fever of 100F or higher  Following upper endoscopy (EGD)  Vomiting of blood or coffee ground material  New chest pain or pain under the  shoulder blades  Painful or persistently difficult swallowing  New shortness of breath  Fever of 100F or higher  Black, tarry-looking stools  For urgent or emergent issues, a gastroenterologist can be reached at any hour by calling [phone]. Do not use MyChart messaging for urgent concerns.    DIET:  We do recommend a small meal at first, but then you may proceed to your regular diet.  Drink plenty of fluids but you should avoid alcoholic beverages for 24 hours.  ACTIVITY:  You should plan to take it easy for the rest of today and you should NOT DRIVE or use heavy machinery until tomorrow (because of the sedation medicines used during the test).    FOLLOW UP: Our staff will call the number listed on your records the next business day following your procedure.  We will call around 7:15- 8:00 am to check on you and address any questions or concerns that you may have regarding the information given to you following your procedure. If we do not reach you, we will leave a message.     If any biopsies were taken you will be contacted by phone or by letter within the next 1-3 weeks.  Please call us  at [phone] if you have not heard about the biopsies in 3 weeks.    SIGNATURES/CONFIDENTIALITY: You and/or your care partner have signed paperwork  which will be entered into your electronic medical record.  These signatures attest to the fact that that the information above on your After Visit Summary has been reviewed and is understood.  Full responsibility of the confidentiality of this discharge information lies with you and/or your care-partner.

## 2023-11-12 NOTE — Progress Notes (Signed)
 Pt's states no medical or surgical changes since previsit or office visit.

## 2023-11-12 NOTE — Progress Notes (Signed)
 Called to room to assist during endoscopic procedure.  Patient ID and intended procedure confirmed with present staff. Received instructions for my participation in the procedure from the performing physician.

## 2023-11-12 NOTE — Op Note (Addendum)
 [facility] Patient Name: Sherry [name] Procedure Date: 11/12/2023 2:14 PM MRN: [id number] Endoscopist: Sergio Dandy , MD, [id number] Age: 68 Referring MD:  Date of Birth: Sep 17, 1955 Gender: Female Account #: [id number] Procedure:                Colonoscopy Indications:              High risk colon cancer surveillance: Personal                            history of colonic polyps, High risk colon cancer                            surveillance: Personal history of adenoma less than                            10 mm in size Medicines:                Monitored Anesthesia Care Procedure:                Pre-Anesthesia Assessment:                           - Prior to the procedure, a History and Physical                            was performed, and patient medications and                            allergies were reviewed. The patient's tolerance of                            previous anesthesia was also reviewed. The risks                            and benefits of the procedure and the sedation                            options and risks were discussed with the patient.                            All questions were answered, and informed consent                            was obtained. Prior Anticoagulants: The patient has                            taken no anticoagulant or antiplatelet agents. ASA                            Grade Assessment: II - A patient with mild systemic                            disease. After reviewing the risks and benefits,  the patient was deemed in satisfactory condition to                            undergo the procedure.                           After obtaining informed consent, the colonoscope                            was passed under direct vision. Throughout the                            procedure, the patient's blood pressure, pulse, and                            oxygen saturations were monitored  continuously. The                            Olympus Scope SN: [id number] was introduced through                            the anus and advanced to the the cecum, identified                            by appendiceal orifice and ileocecal valve. The                            colonoscopy was performed without difficulty. The                            patient tolerated the procedure well. The quality                            of the bowel preparation was good. The ileocecal                            valve, appendiceal orifice, and rectum were                            photographed. Scope In: 2:39:26 PM Scope Out: 2:56:02 PM Scope Withdrawal Time: 0 hours 12 minutes 42 seconds  Total Procedure Duration: 0 hours 16 minutes 36 seconds  Findings:                 The perianal and digital rectal examinations were                            normal.                           Three sessile polyps were found in the transverse                            colon and ascending colon. The polyps were 3 to 5  mm in size. These polyps were removed with a cold                            snare. Resection and retrieval were complete.                           Non-bleeding external and internal hemorrhoids were                            found during retroflexion. The hemorrhoids were                            medium-sized. Complications:            No immediate complications. Estimated Blood Loss:     Estimated blood loss was minimal. Impression:               - Three 3 to 5 mm polyps in the transverse colon                            and in the ascending colon, removed with a cold                            snare. Resected and retrieved.                           - Non-bleeding external and internal hemorrhoids. Recommendation:           - Patient has a contact number available for                            emergencies. The signs and symptoms of potential                             delayed complications were discussed with the                            patient. Return to normal activities tomorrow.                            Written discharge instructions were provided to the                            patient.                           - Resume previous diet.                           - Continue present medications.                           - Await pathology results.                           - Repeat colonoscopy in 5 years for surveillance  based on pathology results. Sherry Cimino V. Kuzey Ogata, MD 11/12/2023 3:02:34 PM This report has been signed electronically.

## 2023-11-12 NOTE — Progress Notes (Signed)
 Sedate, gd SR, tolerated procedure well, VSS, report to RN

## 2023-11-12 NOTE — Progress Notes (Signed)
 [facility] History and Physical   Primary Care Physician:  Alexander Iba, [name]   Reason for Procedure:  GERD, h/o colon polyps  Plan:    EGD and colonoscopy with possible interventions as needed     HPI: Sherry [name] is a very pleasant 68 y.o. female here for EGD and colonoscopy for GERD and h/o colon polyps. Incidental diarrhea and intermittent incontinence Please refer to office visit note by Camilo Cella for details  The risks and benefits as well as alternatives of endoscopic procedure(s) have been discussed and reviewed. All questions answered. The patient agrees to proceed.    Past Medical History:  Diagnosis Date   Allergy 2000   Arthritis 1982   Cataract 2022   Clotting disorder (HCC) bleed/bruise easily   Deviated septum    GERD (gastroesophageal reflux disease) 1958   Hepatitis    hx hep b 1984   Hyperlipemia    Hypothyroidism    Neuromuscular disorder (HCC) sciatic pain   PONV (postoperative nausea and vomiting)    SCCA (squamous cell carcinoma) of skin 07/04/2017   left nose tx cx3 95fu   Sinusitis, chronic    Tubular adenoma 10/09/2017   No high-grade dysplasia or malignancy     Past Surgical History:  Procedure Laterality Date   ANTERIOR CRUCIATE LIGAMENT REPAIR Right    HAND SURGERY Left    HERPES SIMPLEX VIRUS DFA     KNEE SURGERY Left    NASAL SEPTOPLASTY W/ TURBINOPLASTY  08/30/2011   Procedure: NASAL SEPTOPLASTY WITH TURBINATE REDUCTION;  Surgeon: Ammon Bales, MD;  Location: [facility];  Service: ENT;  Laterality: N/A;  nasal septoplasty, bil. inferior turbinated reduction, bil. endoscopic concha bullosa and anterior ethmoid and maxillary antrostomy, ethmoidectomy   NASAL SINUS SURGERY  08/30/2011   Procedure: ENDOSCOPIC SINUS SURGERY;  Surgeon: Ammon Bales, MD;  Location: [facility];  Service: ENT;  Laterality: N/A;   TUBAL LIGATION      Prior to Admission medications   Medication Sig  Start Date End Date Taking? Authorizing Provider  Cyanocobalamin  (VITAMIN B 12 PO) Take by mouth.    [provider]  montelukast  (SINGULAIR ) 10 MG tablet Take 1 tablet (10 mg total) by mouth at bedtime. 02/24/23   Alexander Iba, PA  prednisoLONE acetate (PRED FORTE) 1 % ophthalmic suspension Place 1 drop into the right eye 3 (three) times daily. 03/20/22   [provider]  Turmeric 500 MG CAPS Take 1 capsule by mouth daily.    [provider]  VITAMIN D  PO Take 10,000 Units by mouth daily.    [provider]  zolpidem  (AMBIEN ) 10 MG tablet TAKE 1 TABLET BY MOUTH DAILY AS NEEDED 06/10/22 12/29/22  Alexander Iba, PA    Current Outpatient Medications  Medication Sig Dispense Refill   Cyanocobalamin  (VITAMIN B 12 PO) Take by mouth.     montelukast  (SINGULAIR ) 10 MG tablet Take 1 tablet (10 mg total) by mouth at bedtime. 30 tablet 3   prednisoLONE acetate (PRED FORTE) 1 % ophthalmic suspension Place 1 drop into the right eye 3 (three) times daily.     Turmeric 500 MG CAPS Take 1 capsule by mouth daily.     VITAMIN D  PO Take 10,000 Units by mouth daily.     zolpidem  (AMBIEN ) 10 MG tablet TAKE 1 TABLET BY MOUTH DAILY AS NEEDED 30 tablet 2   Current Facility-Administered Medications  Medication Dose Route Frequency Provider Last Rate Last Admin   0.9 %  sodium chloride infusion  500 mL Intravenous Once Shelah Heatley V, MD        Allergies as of 11/12/2023 - Review Complete 11/12/2023  Allergen Reaction Noted   Reyataz [atazanavir sulfate] Other (See Comments) 01/23/2022   Morphine Nausea And Vomiting 08/09/2021    Family History  Problem Relation Age of Onset   Leukemia Mother    Cancer Mother    Early death Mother    Miscarriages / [place] Mother    Varicose Veins Mother    Lung cancer Father    Cancer Father    Cancer Maternal Uncle    Cancer Maternal Grandmother    Cancer Maternal Grandfather    Stroke Paternal Grandmother    Colon  cancer Neg Hx    Colon polyps Neg Hx    Esophageal cancer Neg Hx    Rectal cancer Neg Hx    Stomach cancer Neg Hx     Social History   Socioeconomic History   Marital status: Divorced    Spouse name: Not on file   Number of children: 3   Years of education: Not on file   Highest education level: Not on file  Occupational History    Employer: [employer]  Tobacco Use   Smoking status: Never   Smokeless tobacco: Never   Tobacco comments:    none  Vaping Use   Vaping status: Never Used  Substance and Sexual Activity   Alcohol use: No   Drug use: No   Sexual activity: Never  Other Topics Concern   Not on file  Social History Narrative   Lives with oldest and youngest son   Social Drivers of Corporate investment banker Strain: Low Risk  (07/10/2023)   Overall Financial Resource Strain (CARDIA)    Difficulty of Paying Living Expenses: Not hard at all  Food Insecurity: No Food Insecurity (07/10/2023)   Hunger Vital Sign    Worried About Running Out of Food in the Last Year: Never true    Ran Out of Food in the Last Year: Never true  Transportation Needs: No Transportation Needs (07/10/2023)   PRAPARE - Administrator, Civil Service (Medical): No    Lack of Transportation (Non-Medical): No  Physical Activity: Sufficiently Active (07/10/2023)   Exercise Vital Sign    Days of Exercise per Week: 5 days    Minutes of Exercise per Session: 30 min  Stress: No Stress Concern Present (07/10/2023)   [employer] of Occupational Health - Occupational Stress Questionnaire    Feeling of Stress : Not at all  Social Connections: Socially Isolated (07/10/2023)   Social Connection and Isolation Panel [NHANES]    Frequency of Communication with Friends and Family: More than three times a week    Frequency of Social Gatherings with Friends and Family: More than three times a week    Attends Religious Services: Never    Database administrator or Organizations: No    Attends  Banker Meetings: Never    Marital Status: Divorced  Catering manager Violence: Not At Risk (07/10/2023)   Humiliation, Afraid, Rape, and Kick questionnaire    Fear of Current or Ex-Partner: No    Emotionally Abused: No    Physically Abused: No    Sexually Abused: No    Review of Systems:  All other review of systems negative except as mentioned in the HPI.  Physical Exam: Vital signs in last 24 hours: BP (!) 146/92   Pulse  80   Temp 98.1 F (36.7 C)   Resp (!) 8   Ht 5\' 7"  (1.702 m)   Wt 191 lb (86.6 kg)   SpO2 100%   BMI 29.91 kg/m  General:   Alert, NAD Lungs:  Clear .   Heart:  Regular rate and rhythm Abdomen:  Soft, nontender and nondistended. Neuro/Psych:  Alert and cooperative. Normal mood and affect. A and O x 3  Reviewed labs, radiology imaging, old records and pertinent past GI work up  Patient is appropriate for planned procedure(s) and anesthesia in an ambulatory setting   K. Veena Larenda Reedy , MD [phone]

## 2023-11-13 ENCOUNTER — Telehealth: Payer: Self-pay | Admitting: Lactation Services

## 2023-11-13 NOTE — Telephone Encounter (Signed)
  Follow up Call-     11/12/2023    1:22 PM  Call back number  Post procedure Call Back phone  # [phone]  Permission to leave phone message Yes     Patient questions:  Do you have a fever, pain , or abdominal swelling? No. Pain Score  0 *  Have you tolerated food without any problems? Yes.    Have you been able to return to your normal activities? Yes.    Do you have any questions about your discharge instructions: Diet   No. Medications  No. Follow up visit  No.  Do you have questions or concerns about your Care? No.  Actions: * If pain score is 4 or above: No action needed, pain <4.

## 2023-11-17 LAB — SURGICAL PATHOLOGY

## 2023-12-10 NOTE — Progress Notes (Signed)
 [name] [address] [place] [address] Phone: [phone] Subjective:   Sherry [name], am serving as a scribe for Dr. Ronnell Coins.  I'm seeing this patient by the request  of:  Alexander Iba, [name]  CC: Shoulder and knee pain follow-up  [id number]  09/10/2023 Has had trigger points of the right shoulder previously.  Will monitor.  If worsening symptoms consider injections again.  Discussed icing regimen.  Has attempted osteopathic manipulation previously as well and we can monitor that as well.  Discussed icing regimen and home exercises.  Follow-up again in 6 to 8 weeks     Patient given injection and tolerated the procedure well, chronic problem with worsening symptoms. Known arthritic changes. No worsening pain could potentially consider the possibility of viscosupplementation. Patient is in agreement with this. Discussed icing regimen and home exercises. Follow-up again in 6 to 8 weeks.   Update 12/11/2023 Sherry [name] [id number] is a 68 y.o. female coming in with complaint of L knee and R shoulder trigger points. Patient states that her knee pain finally improved a few weeks ago. Wears brace at work prn.   Upper back and shoulder pain have improved. Modified sleeping position.    Past Medical History:  Diagnosis Date   Allergy 2000   Arthritis 1982   Cataract 2022   Clotting disorder (HCC) bleed/bruise easily   Deviated septum    GERD (gastroesophageal reflux disease) 1958   Hepatitis    hx hep b 1984   Hyperlipemia    Hypothyroidism    Neuromuscular disorder (HCC) sciatic pain   PONV (postoperative nausea and vomiting)    SCCA (squamous cell carcinoma) of skin 07/04/2017   left nose tx cx3 6fu   Sinusitis, chronic    Tubular adenoma 10/09/2017   No high-grade dysplasia or malignancy    Past Surgical History:  Procedure Laterality Date   ANTERIOR CRUCIATE LIGAMENT REPAIR Right    HAND SURGERY Left    HERPES SIMPLEX  VIRUS DFA     KNEE SURGERY Left    NASAL SEPTOPLASTY W/ TURBINOPLASTY  08/30/2011   Procedure: NASAL SEPTOPLASTY WITH TURBINATE REDUCTION;  Surgeon: Ammon Bales, MD;  Location: [facility];  Service: ENT;  Laterality: N/A;  nasal septoplasty, bil. inferior turbinated reduction, bil. endoscopic concha bullosa and anterior ethmoid and maxillary antrostomy, ethmoidectomy   NASAL SINUS SURGERY  08/30/2011   Procedure: ENDOSCOPIC SINUS SURGERY;  Surgeon: Ammon Bales, MD;  Location: [facility];  Service: ENT;  Laterality: N/A;   TUBAL LIGATION     Social History   Socioeconomic History   Marital status: Divorced    Spouse name: Not on file   Number of children: 3   Years of education: Not on file   Highest education level: Not on file  Occupational History    Employer: [employer]  Tobacco Use   Smoking status: Never   Smokeless tobacco: Never   Tobacco comments:    none  Vaping Use   Vaping status: Never Used  Substance and Sexual Activity   Alcohol use: No   Drug use: No   Sexual activity: Never  Other Topics Concern   Not on file  Social History Narrative   Lives with oldest and youngest son   Social Drivers of Corporate investment banker Strain: Low Risk  (07/10/2023)   Overall Financial Resource Strain (CARDIA)    Difficulty of Paying Living Expenses: Not hard at all  Food Insecurity: No Food Insecurity (07/10/2023)   Hunger Vital Sign    Worried About Running Out of Food in the Last Year: Never true    Ran Out of Food in the Last Year: Never true  Transportation Needs: No Transportation Needs (07/10/2023)   PRAPARE - Administrator, Civil Service (Medical): No    Lack of Transportation (Non-Medical): No  Physical Activity: Sufficiently Active (07/10/2023)   Exercise Vital Sign    Days of Exercise per Week: 5 days    Minutes of Exercise per Session: 30 min  Stress: No Stress Concern Present (07/10/2023)   [employer]  of Occupational Health - Occupational Stress Questionnaire    Feeling of Stress : Not at all  Social Connections: Socially Isolated (07/10/2023)   Social Connection and Isolation Panel    Frequency of Communication with Friends and Family: More than three times a week    Frequency of Social Gatherings with Friends and Family: More than three times a week    Attends Religious Services: Never    Database administrator or Organizations: No    Attends Engineer, structural: Never    Marital Status: Divorced   Allergies  Allergen Reactions   Reyataz Lobbyist Sulfate] Other (See Comments)    Jaundice   Morphine Nausea And Vomiting   Family History  Problem Relation Age of Onset   Leukemia Mother    Cancer Mother    Early death Mother    Miscarriages / [place] Mother    Varicose Veins Mother    Lung cancer Father    Cancer Father    Cancer Maternal Uncle    Cancer Maternal Grandmother    Cancer Maternal Grandfather    Stroke Paternal Grandmother    Colon cancer Neg Hx    Colon polyps Neg Hx    Esophageal cancer Neg Hx    Rectal cancer Neg Hx    Stomach cancer Neg Hx       Current Outpatient Medications (Respiratory):    montelukast  (SINGULAIR ) 10 MG tablet, Take 1 tablet (10 mg total) by mouth at bedtime.   Current Outpatient Medications (Hematological):    Cyanocobalamin  (VITAMIN B 12 PO), Take by mouth.  Current Outpatient Medications (Other):    pantoprazole  (PROTONIX ) 40 MG tablet, Take 1 tablet (40 mg total) by mouth daily.   prednisoLONE acetate (PRED FORTE) 1 % ophthalmic suspension, Place 1 drop into the right eye 3 (three) times daily.   Turmeric 500 MG CAPS, Take 1 capsule by mouth daily.   VITAMIN D  PO, Take 10,000 Units by mouth daily.   zolpidem  (AMBIEN ) 10 MG tablet, TAKE 1 TABLET BY MOUTH DAILY AS NEEDED   Reviewed prior external information including notes and imaging from  primary care provider As well as notes that were available from  care everywhere and other healthcare systems.  Past medical history, social, surgical and family history all reviewed in electronic medical record.  No pertanent information unless stated regarding to the chief complaint.   Review of Systems:  No headache, visual changes, nausea, vomiting, diarrhea, constipation, dizziness, abdominal pain, skin rash, fevers, chills, night sweats, weight loss, swollen lymph nodes, body aches, joint swelling, chest pain, shortness of breath, mood changes. POSITIVE muscle aches  Objective  Blood pressure 110/76, pulse 74, height 5' 7 (1.702 m), SpO2 98%.   General: No apparent distress alert and oriented x3 mood and affect normal, dressed appropriately.  HEENT: Pupils equal, extraocular movements intact  Respiratory: Patient's speak in full sentences and does not appear short of breath  Cardiovascular: No lower extremity edema, non tender, no erythema  Shoulder exam shows significant improvement in range of motion.  No significant weakness noted at the moment.  Knee exam shows crepitus noted but nothing severe.  Instability of the left knee with valgus and varus force.    Impression and Recommendations:      The above documentation has been reviewed and is accurate and complete Sherry [name] [employer], DO

## 2023-12-11 ENCOUNTER — Ambulatory Visit: Admitting: Family Medicine

## 2023-12-11 ENCOUNTER — Telehealth: Payer: Self-pay

## 2023-12-11 VITALS — BP 110/76 | HR 74 | Ht 67.0 in

## 2023-12-11 DIAGNOSIS — M1712 Unilateral primary osteoarthritis, left knee: Secondary | ICD-10-CM

## 2023-12-11 NOTE — Telephone Encounter (Signed)
 Patient ran for Monovisc for left knee on 12/11/2023. Case #: [id number]. Pending approval.

## 2023-12-11 NOTE — Telephone Encounter (Signed)
-----   Message from Sherry [name] sent at 12/11/2023 10:03 AM EDT ----- Regarding: visco Can you please run patient for visco for L knee?  Thanks

## 2023-12-11 NOTE — Assessment & Plan Note (Signed)
 Known arthritic changes but would respond extremely well likely to viscosupplementation and we will try to get approval.  Discussed icing regimen of home exercises, discussed which activities to do and which ones to avoid.  Increase activity slowly.  Follow-up again in 6 to 8 weeks otherwise.

## 2023-12-15 NOTE — Telephone Encounter (Signed)
 Patient scheduled for 02/05/24  Monovisc authorized for left knee Deductible does not apply OOP MAX $3400 has met $140 Once OOP is met coverage goes to 100% Coinsurance 20% Reference number [id number]

## 2023-12-15 NOTE — Telephone Encounter (Signed)
 noted

## 2023-12-17 ENCOUNTER — Ambulatory Visit: Payer: Self-pay | Admitting: Gastroenterology

## 2024-02-02 ENCOUNTER — Other Ambulatory Visit (HOSPITAL_BASED_OUTPATIENT_CLINIC_OR_DEPARTMENT_OTHER): Payer: Self-pay

## 2024-02-02 ENCOUNTER — Other Ambulatory Visit (HOSPITAL_COMMUNITY): Payer: Self-pay

## 2024-02-04 NOTE — Progress Notes (Unsigned)
 [name] [address] [place] [address] Phone: [phone] Subjective:   Sherry [name], am serving as a scribe for Dr. Arthea Claudene.  I'm seeing this patient by the request  of:  Job Lukes, [name]  CC: Left knee pain, acute low back pain and hip pain  YEP:Dlagzrupcz  12/11/2023 Known arthritic changes but would respond extremely well likely to viscosupplementation and we will try to get approval.  Discussed icing regimen of home exercises, discussed which activities to do and which ones to avoid.  Increase activity slowly.  Follow-up again in 6 to 8 weeks otherwise.      Update 02/05/2024 Sherry [name] is a 68 y.o. female coming in with complaint of L knee pain. Patient states that she is the same as last visit. Has been trying to walk more for exercise but her lower back and hips have been sore. Also standing more in OR.        Past Medical History:  Diagnosis Date   Allergy 2000   Arthritis 1982   Cataract 2022   Clotting disorder (HCC) bleed/bruise easily   Deviated septum    GERD (gastroesophageal reflux disease) 1958   Hepatitis    hx hep b 1984   Hyperlipemia    Hypothyroidism    Neuromuscular disorder (HCC) sciatic pain   PONV (postoperative nausea and vomiting)    SCCA (squamous cell carcinoma) of skin 07/04/2017   left nose tx cx3 50fu   Sinusitis, chronic    Tubular adenoma 10/09/2017   No high-grade dysplasia or malignancy    Past Surgical History:  Procedure Laterality Date   ANTERIOR CRUCIATE LIGAMENT REPAIR Right    HAND SURGERY Left    HERPES SIMPLEX VIRUS DFA     KNEE SURGERY Left    NASAL SEPTOPLASTY W/ TURBINOPLASTY  08/30/2011   Procedure: NASAL SEPTOPLASTY WITH TURBINATE REDUCTION;  Surgeon: Alm Bouche, MD;  Location: [facility];  Service: ENT;  Laterality: N/A;  nasal septoplasty, bil. inferior turbinated reduction, bil. endoscopic concha bullosa and anterior ethmoid and  maxillary antrostomy, ethmoidectomy   NASAL SINUS SURGERY  08/30/2011   Procedure: ENDOSCOPIC SINUS SURGERY;  Surgeon: Alm Bouche, MD;  Location: [facility];  Service: ENT;  Laterality: N/A;   TUBAL LIGATION     Social History   Socioeconomic History   Marital status: Divorced    Spouse name: Not on file   Number of children: 3   Years of education: Not on file   Highest education level: Not on file  Occupational History    Employer: [employer]  Tobacco Use   Smoking status: Never   Smokeless tobacco: Never   Tobacco comments:    none  Vaping Use   Vaping status: Never Used  Substance and Sexual Activity   Alcohol use: No   Drug use: No   Sexual activity: Never  Other Topics Concern   Not on file  Social History Narrative   Lives with oldest and youngest son   Social Drivers of Corporate investment banker Strain: Low Risk  (07/10/2023)   Overall Financial Resource Strain (CARDIA)    Difficulty of Paying Living Expenses: Not hard at all  Food Insecurity: No Food Insecurity (07/10/2023)   Hunger Vital Sign    Worried About Radiation protection practitioner of Food in the Last Year: Never true    Ran Out of Food in the Last Year: Never true  Transportation Needs: No  Transportation Needs (07/10/2023)   PRAPARE - Administrator, Civil Service (Medical): No    Lack of Transportation (Non-Medical): No  Physical Activity: Sufficiently Active (07/10/2023)   Exercise Vital Sign    Days of Exercise per Week: 5 days    Minutes of Exercise per Session: 30 min  Stress: No Stress Concern Present (07/10/2023)   [employer] of Occupational Health - Occupational Stress Questionnaire    Feeling of Stress : Not at all  Social Connections: Socially Isolated (07/10/2023)   Social Connection and Isolation Panel    Frequency of Communication with Friends and Family: More than three times a week    Frequency of Social Gatherings with Friends and Family: More than three times a  week    Attends Religious Services: Never    Database administrator or Organizations: No    Attends Engineer, structural: Never    Marital Status: Divorced   Allergies  Allergen Reactions   Reyataz Lobbyist Sulfate] Other (See Comments)    Jaundice   Morphine Nausea And Vomiting   Family History  Problem Relation Age of Onset   Leukemia Mother    Cancer Mother    Early death Mother    Miscarriages / [place] Mother    Varicose Veins Mother    Lung cancer Father    Cancer Father    Cancer Maternal Uncle    Cancer Maternal Grandmother    Cancer Maternal Grandfather    Stroke Paternal Grandmother    Colon cancer Neg Hx    Colon polyps Neg Hx    Esophageal cancer Neg Hx    Rectal cancer Neg Hx    Stomach cancer Neg Hx       Current Outpatient Medications (Respiratory):    montelukast  (SINGULAIR ) 10 MG tablet, Take 1 tablet (10 mg total) by mouth at bedtime.   Current Outpatient Medications (Hematological):    Cyanocobalamin  (VITAMIN B 12 PO), Take by mouth.  Current Outpatient Medications (Other):    pantoprazole  (PROTONIX ) 40 MG tablet, Take 1 tablet (40 mg total) by mouth daily.   prednisoLONE acetate (PRED FORTE) 1 % ophthalmic suspension, Place 1 drop into the right eye 3 (three) times daily.   Turmeric 500 MG CAPS, Take 1 capsule by mouth daily.   VITAMIN D  PO, Take 10,000 Units by mouth daily.   zolpidem  (AMBIEN ) 10 MG tablet, TAKE 1 TABLET BY MOUTH DAILY AS NEEDED   Reviewed prior external information including notes and imaging from  primary care provider As well as notes that were available from care everywhere and other healthcare systems.  Past medical history, social, surgical and family history all reviewed in electronic medical record.  No pertanent information unless stated regarding to the chief complaint.   Review of Systems:  No headache, visual changes, nausea, vomiting, diarrhea, constipation, dizziness, abdominal pain, skin rash,  fevers, chills, night sweats, weight loss, swollen lymph nodes, body aches, joint swelling, chest pain, shortness of breath, mood changes. POSITIVE muscle aches  Objective  Blood pressure 124/82, pulse 80, height 5' 7 (1.702 m), SpO2 98%.   General: No apparent distress alert and oriented x3 mood and affect normal, dressed appropriately.  HEENT: Pupils equal, extraocular movements intact  Respiratory: Patient's speak in full sentences and does not appear short of breath  Cardiovascular: No lower extremity edema, non tender, no erythema  Lower back does have tightness noted.  Seems to be though more tenderness over the gluteal tendon bilaterally.  No significant weakness noted today.  Some difficulty with extension of the back.  Left knee does have significant swelling noted.  Effusion noted.  Some instability with valgus and varus force.   After informed written and verbal consent, patient was seated on exam table. Left knee was prepped with alcohol swab and utilizing anterolateral approach, patient's left knee space was injected with 40 mg per 3 mL of Monovisc (sodium hyaluronate) in a prefilled syringe was injected easily into the knee through a 22-gauge needle..Patient tolerated the procedure well without immediate complications.     Impression and Recommendations:     The above documentation has been reviewed and is accurate and complete Esma Kilts M Lynnel Zanetti, DO

## 2024-02-05 ENCOUNTER — Ambulatory Visit

## 2024-02-05 ENCOUNTER — Ambulatory Visit: Admitting: Family Medicine

## 2024-02-05 ENCOUNTER — Encounter: Payer: Self-pay | Admitting: Family Medicine

## 2024-02-05 VITALS — BP 124/82 | HR 80 | Ht 67.0 in

## 2024-02-05 DIAGNOSIS — M25551 Pain in right hip: Secondary | ICD-10-CM

## 2024-02-05 DIAGNOSIS — M25552 Pain in left hip: Secondary | ICD-10-CM | POA: Diagnosis not present

## 2024-02-05 DIAGNOSIS — M1712 Unilateral primary osteoarthritis, left knee: Secondary | ICD-10-CM

## 2024-02-05 DIAGNOSIS — G5701 Lesion of sciatic nerve, right lower limb: Secondary | ICD-10-CM | POA: Diagnosis not present

## 2024-02-05 DIAGNOSIS — M160 Bilateral primary osteoarthritis of hip: Secondary | ICD-10-CM | POA: Diagnosis not present

## 2024-02-05 DIAGNOSIS — M545 Low back pain, unspecified: Secondary | ICD-10-CM | POA: Diagnosis not present

## 2024-02-05 DIAGNOSIS — M25559 Pain in unspecified hip: Secondary | ICD-10-CM | POA: Diagnosis not present

## 2024-02-05 DIAGNOSIS — M4316 Spondylolisthesis, lumbar region: Secondary | ICD-10-CM | POA: Diagnosis not present

## 2024-02-05 DIAGNOSIS — M47816 Spondylosis without myelopathy or radiculopathy, lumbar region: Secondary | ICD-10-CM | POA: Diagnosis not present

## 2024-02-05 MED ORDER — HYALURONAN 88 MG/4ML IX SOSY
88.0000 mg | PREFILLED_SYRINGE | Freq: Once | INTRA_ARTICULAR | Status: AC
  Administered 2024-02-05: 88 mg via INTRA_ARTICULAR

## 2024-02-05 NOTE — Assessment & Plan Note (Signed)
 Patient given injection and tolerated the procedure well, discussed icing regimen of home exercises, patient has numbness for some time and does have some degenerative arthritic changes noted.  Increase activity slowly.  Discussed icing regimen.  Follow-up again in 2 months

## 2024-02-05 NOTE — Assessment & Plan Note (Signed)
 Has had piriformis syndrome previously but seems to be more having more of a gluteal tendinitis.  Has been doing a lot more walking on an incline and likely is contributing.  Home exercises given, discussed the potential for osteopathic manipulation but do not think it would be necessary at this time.  Increase activity slowly.  Follow-up again in 6 to 8 weeks.

## 2024-02-05 NOTE — Patient Instructions (Addendum)
 Monovisc injection Try to decrease incline Glute ex Lumbar and pelvis xray See me again in 8 weeks

## 2024-02-10 DIAGNOSIS — R519 Headache, unspecified: Secondary | ICD-10-CM | POA: Diagnosis not present

## 2024-02-10 DIAGNOSIS — H18513 Endothelial corneal dystrophy, bilateral: Secondary | ICD-10-CM | POA: Diagnosis not present

## 2024-02-10 DIAGNOSIS — H5203 Hypermetropia, bilateral: Secondary | ICD-10-CM | POA: Diagnosis not present

## 2024-02-10 DIAGNOSIS — H2513 Age-related nuclear cataract, bilateral: Secondary | ICD-10-CM | POA: Diagnosis not present

## 2024-02-18 ENCOUNTER — Ambulatory Visit: Payer: Self-pay | Admitting: Family Medicine

## 2024-05-10 NOTE — Progress Notes (Unsigned)
 [name] [address] [place] [address] Phone: [phone] Subjective:   Sherry [name], am serving as a scribe for Dr. Arthea Claudene.  I'm seeing this patient by the request  of:  Job Lukes, [name]  CC: Right hip, left knee  YEP:Dlagzrupcz  02/05/2024 Has had piriformis syndrome previously but seems to be more having more of a gluteal tendinitis. Has been doing a lot more walking on an incline and likely is contributing. Home exercises given, discussed the potential for osteopathic manipulation but do not think it would be necessary at this time. Increase activity slowly. Follow-up again in 6 to 8 weeks.   Update 05/11/2024 Sherry [name] Hmpwidujqq is a 68 y.o. female coming in with complaint of R hip, L knee pain. Patient states hip pain is still there. Knee pain is improving. No new symptoms or concerns.    Xray pelvis 02/05/2024 IMPRESSION: 1. No acute fracture or dislocation.  Mild arthritis both hips.  Xray lumbar 02/05/2024 IMPRESSION: 1. No acute fracture or evidence of traumatic listhesis. 2. Moderate degenerative changes at L4-L5 and L5-S1.     Past Medical History:  Diagnosis Date   Allergy 2000   Arthritis 1982   Cataract 2022   Clotting disorder bleed/bruise easily   Deviated septum    GERD (gastroesophageal reflux disease) 1958   Hepatitis    hx hep b 1984   Hyperlipemia    Hypothyroidism    Neuromuscular disorder (HCC) sciatic pain   PONV (postoperative nausea and vomiting)    SCCA (squamous cell carcinoma) of skin 07/04/2017   left nose tx cx3 5fu   Sinusitis, chronic    Tubular adenoma 10/09/2017   No high-grade dysplasia or malignancy    Past Surgical History:  Procedure Laterality Date   ANTERIOR CRUCIATE LIGAMENT REPAIR Right    HAND SURGERY Left    HERPES SIMPLEX VIRUS DFA     KNEE SURGERY Left    NASAL SEPTOPLASTY W/ TURBINOPLASTY  08/30/2011   Procedure: NASAL SEPTOPLASTY WITH TURBINATE REDUCTION;   Surgeon: Alm Bouche, MD;  Location: [facility];  Service: ENT;  Laterality: N/A;  nasal septoplasty, bil. inferior turbinated reduction, bil. endoscopic concha bullosa and anterior ethmoid and maxillary antrostomy, ethmoidectomy   NASAL SINUS SURGERY  08/30/2011   Procedure: ENDOSCOPIC SINUS SURGERY;  Surgeon: Alm Bouche, MD;  Location: [facility];  Service: ENT;  Laterality: N/A;   TUBAL LIGATION     Social History   Socioeconomic History   Marital status: Divorced    Spouse name: Not on file   Number of children: 3   Years of education: Not on file   Highest education level: Not on file  Occupational History    Employer: [employer]  Tobacco Use   Smoking status: Never   Smokeless tobacco: Never   Tobacco comments:    none  Vaping Use   Vaping status: Never Used  Substance and Sexual Activity   Alcohol use: No   Drug use: No   Sexual activity: Never  Other Topics Concern   Not on file  Social History Narrative   Lives with oldest and youngest son   Social Drivers of Corporate Investment Banker Strain: Low Risk  (07/10/2023)   Overall Financial Resource Strain (CARDIA)    Difficulty of Paying Living Expenses: Not hard at all  Food Insecurity: No Food Insecurity (07/10/2023)   Hunger Vital Sign    Worried About Running Out  of Food in the Last Year: Never true    Ran Out of Food in the Last Year: Never true  Transportation Needs: No Transportation Needs (07/10/2023)   PRAPARE - Administrator, Civil Service (Medical): No    Lack of Transportation (Non-Medical): No  Physical Activity: Sufficiently Active (07/10/2023)   Exercise Vital Sign    Days of Exercise per Week: 5 days    Minutes of Exercise per Session: 30 min  Stress: No Stress Concern Present (07/10/2023)   [employer] of Occupational Health - Occupational Stress Questionnaire    Feeling of Stress : Not at all  Social Connections: Socially Isolated (07/10/2023)    Social Connection and Isolation Panel    Frequency of Communication with Friends and Family: More than three times a week    Frequency of Social Gatherings with Friends and Family: More than three times a week    Attends Religious Services: Never    Database Administrator or Organizations: No    Attends Engineer, Structural: Never    Marital Status: Divorced   Allergies  Allergen Reactions   Reyataz Lobbyist Sulfate] Other (See Comments)    Jaundice   Morphine Nausea And Vomiting   Family History  Problem Relation Age of Onset   Leukemia Mother    Cancer Mother    Early death Mother    Miscarriages / Stillbirths Mother    Varicose Veins Mother    Lung cancer Father    Cancer Father    Cancer Maternal Uncle    Cancer Maternal Grandmother    Cancer Maternal Grandfather    Stroke Paternal Grandmother    Colon cancer Neg Hx    Colon polyps Neg Hx    Esophageal cancer Neg Hx    Rectal cancer Neg Hx    Stomach cancer Neg Hx       Current Outpatient Medications (Respiratory):    montelukast  (SINGULAIR ) 10 MG tablet, Take 1 tablet (10 mg total) by mouth at bedtime.   Current Outpatient Medications (Hematological):    Cyanocobalamin  (VITAMIN B 12 PO), Take by mouth.  Current Outpatient Medications (Other):    pantoprazole  (PROTONIX ) 40 MG tablet, Take 1 tablet (40 mg total) by mouth daily.   prednisoLONE acetate (PRED FORTE) 1 % ophthalmic suspension, Place 1 drop into the right eye 3 (three) times daily.   Turmeric 500 MG CAPS, Take 1 capsule by mouth daily.   VITAMIN D  PO, Take 10,000 Units by mouth daily.   zolpidem  (AMBIEN ) 10 MG tablet, TAKE 1 TABLET BY MOUTH DAILY AS NEEDED   Reviewed prior external information including notes and imaging from  primary care provider As well as notes that were available from care everywhere and other healthcare systems.  Past medical history, social, surgical and family history all reviewed in electronic medical  record.  No pertanent information unless stated regarding to the chief complaint.   Review of Systems:  No headache, visual changes, nausea, vomiting, diarrhea, constipation, dizziness, abdominal pain, skin rash, fevers, chills, night sweats, weight loss, swollen lymph nodes, body aches, joint swelling, chest pain, shortness of breath, mood changes. POSITIVE muscle aches  Objective  Blood pressure 130/80, pulse 87, height 5' 7 (1.702 m), SpO2 98%.   General: No apparent distress alert and oriented x3 mood and affect normal, dressed appropriately.  HEENT: Pupils equal, extraocular movements intact  Respiratory: Patient's speak in full sentences and does not appear short of breath  Cardiovascular: No lower extremity  edema, non tender, no erythema  Moderate arthritic changes of the back noted with limited range of motion. Left knee does show some arthritic changes noted.  Some crepitus noted.  Osteopathic findings T9 extended rotated and side bent left L2 flexed rotated and side bent right L4 flexed rotated and side bent right L5 flexed rotated sidebent left. Sacrum right on right    Impression and Recommendations:    Lumbar radiculopathy, right Likely more of a lumbar radiculopathy than the hip itself at this time.  We discussed icing regimen and home exercises, discussed which activities to do and which ones to avoid.  Increase activity slowly.  Discussed icing regimen and home exercises.  Follow-up again in 6 to 12 weeks.    Decision today to treat with OMT was based on Physical Exam  After verbal consent patient was treated with HVLA, ME, FPR techniques in  thoracic,  lumbar and sacral areas, all areas are chronic   Patient tolerated the procedure well with improvement in symptoms  Patient given exercises, stretches and lifestyle modifications  See medications in patient instructions if given  Patient will follow up in 4-8 weeks  The above documentation has been reviewed  and is accurate and complete Tripton Ned M Keandria Berrocal, DO

## 2024-05-11 ENCOUNTER — Other Ambulatory Visit: Payer: Self-pay | Admitting: Family Medicine

## 2024-05-11 ENCOUNTER — Other Ambulatory Visit (HOSPITAL_COMMUNITY): Payer: Self-pay

## 2024-05-11 ENCOUNTER — Encounter: Payer: Self-pay | Admitting: Family Medicine

## 2024-05-11 ENCOUNTER — Ambulatory Visit: Admitting: Family Medicine

## 2024-05-11 ENCOUNTER — Telehealth: Payer: Self-pay | Admitting: Family Medicine

## 2024-05-11 VITALS — BP 130/80 | HR 87 | Ht 67.0 in

## 2024-05-11 DIAGNOSIS — M9902 Segmental and somatic dysfunction of thoracic region: Secondary | ICD-10-CM | POA: Diagnosis not present

## 2024-05-11 DIAGNOSIS — M5416 Radiculopathy, lumbar region: Secondary | ICD-10-CM | POA: Diagnosis not present

## 2024-05-11 DIAGNOSIS — M9904 Segmental and somatic dysfunction of sacral region: Secondary | ICD-10-CM

## 2024-05-11 DIAGNOSIS — M9903 Segmental and somatic dysfunction of lumbar region: Secondary | ICD-10-CM

## 2024-05-11 MED ORDER — AMOXICILLIN-POT CLAVULANATE 875-125 MG PO TABS: 1.0000 | ORAL_TABLET | Freq: Two times a day (BID) | ORAL | 0 refills | Status: DC

## 2024-05-11 NOTE — Patient Instructions (Signed)
 Happy Holidays Try the clamshell on the wall Ice back instead of hips with discomfort Splint at night Heat and massage finger See you again in 3-4 months

## 2024-05-11 NOTE — Assessment & Plan Note (Signed)
 Likely more of a lumbar radiculopathy than the hip itself at this time.  We discussed icing regimen and home exercises, discussed which activities to do and which ones to avoid.  Increase activity slowly.  Discussed icing regimen and home exercises.  Follow-up again in 6 to 12 weeks.

## 2024-05-11 NOTE — Telephone Encounter (Signed)
 Patient called stating that Augmentin  was supposed to be sent to [facility] in [facility] from her visit today.

## 2024-05-11 NOTE — Telephone Encounter (Signed)
 Prescription sent

## 2024-06-08 ENCOUNTER — Other Ambulatory Visit (HOSPITAL_COMMUNITY): Payer: Self-pay

## 2024-07-09 ENCOUNTER — Encounter: Admitting: Family Medicine

## 2024-07-12 ENCOUNTER — Ambulatory Visit: Payer: Self-pay

## 2024-07-19 ENCOUNTER — Ambulatory Visit: Payer: Self-pay

## 2024-07-19 VITALS — Ht 66.0 in | Wt 191.0 lb

## 2024-07-19 DIAGNOSIS — Z0000 Encounter for general adult medical examination without abnormal findings: Secondary | ICD-10-CM

## 2024-07-19 NOTE — Progress Notes (Signed)
 "  Chief Complaint  Patient presents with   Medicare Wellness     Subjective:   Sherry [name] is a 69 y.o. female who presents for a Medicare Annual Wellness Visit.  Visit info / Clinical Intake: Medicare Wellness Visit Type:: Subsequent Annual Wellness Visit Persons participating in visit and providing information:: patient Interpreter Needed?: No Pre-visit prep was completed: yes AWV questionnaire completed by patient prior to visit?: no Living arrangements:: with family/others Patient's Overall Health Status Rating: very good Typical amount of pain: some Does pain affect daily life?: no Are you currently prescribed opioids?: no  Dietary Habits and Nutritional Risks How many meals a day?: 3 Eats fruit and vegetables daily?: (!) no (no fruit) Most meals are obtained by: preparing own meals Diabetic:: no  Functional Status Activities of Daily Living (to include ambulation/medication): Independent Ambulation: Independent with device- listed below Home Assistive Devices/Equipment: Eyeglasses Medication Administration: Independent Home Management (perform basic housework or laundry): Independent Manage your own finances?: yes Primary transportation is: driving Concerns about vision?: no *vision screening is required for WTM* Concerns about hearing?: no  Fall Screening Falls in the past year?: 0 Number of falls in past year: 0 Was there an injury with Fall?: 0 Fall Risk Category Calculator: 0 Patient Fall Risk Level: Low Fall Risk  Fall Risk Patient at Risk for Falls Due to: No Fall Risks Fall risk Follow up: Falls evaluation completed  Home and Transportation Safety: All rugs have non-skid backing?: yes All stairs or steps have railings?: (!) no Grab bars in the bathtub or shower?: yes Have non-skid surface in bathtub or shower?: yes Good home lighting?: yes Regular seat belt use?: yes Hospital stays in the last year:: no  Cognitive Assessment Difficulty  concentrating, remembering, or making decisions? : no Will 6CIT or Mini Cog be Completed: yes What year is it?: 0 points What month is it?: 0 points Give patient an address phrase to remember (5 components): [address] [place]  About what time is it?: 0 points Count backwards from 20 to 1: 2 points (2) Say the months of the year in reverse: 0 points Repeat the address phrase from earlier: 2 points (87) 6 CIT Score: 4 points  Advance Directives (For Healthcare) Does Patient Have a Medical Advance Directive?: No Would patient like information on creating a medical advance directive?: No - Patient declined  Reviewed/Updated  Reviewed/Updated: Reviewed All (Medical, Surgical, Family, Medications, Allergies, Care Teams, Patient Goals)    Allergies (verified) Reyataz [atazanavir sulfate] and Morphine   Current Medications (verified) Outpatient Encounter Medications as of 07/19/2024  Medication Sig   Cyanocobalamin  (VITAMIN B 12 PO) Take by mouth.   pantoprazole  (PROTONIX ) 40 MG tablet Take 1 tablet (40 mg total) by mouth daily.   Turmeric 500 MG CAPS Take 1 capsule by mouth daily.   VITAMIN D  PO Take 10,000 Units by mouth daily.   zolpidem  (AMBIEN ) 10 MG tablet TAKE 1 TABLET BY MOUTH DAILY AS NEEDED   montelukast  (SINGULAIR ) 10 MG tablet Take 1 tablet (10 mg total) by mouth at bedtime. (Patient not taking: Reported on 07/19/2024)   [DISCONTINUED] amoxicillin -clavulanate (AUGMENTIN ) 875-125 MG tablet Take 1 tablet by mouth 2 (two) times daily.   [DISCONTINUED] prednisoLONE acetate (PRED FORTE) 1 % ophthalmic suspension Place 1 drop into the right eye 3 (three) times daily.   No facility-administered encounter medications on file as of 07/19/2024.    History: Past Medical History:  Diagnosis Date   Allergy 1978   Arthritis  1982   Cataract 2017   Clotting disorder bleed/bruise easily   Deviated septum    GERD (gastroesophageal reflux disease) 1958   Hepatitis    hx hep b 1984    Hyperlipemia    Hypothyroidism    Neuromuscular disorder (HCC) sciatic pain   PONV (postoperative nausea and vomiting)    SCCA (squamous cell carcinoma) of skin 07/04/2017   left nose tx cx3 37fu   Sinusitis, chronic    Tubular adenoma 10/09/2017   No high-grade dysplasia or malignancy    Past Surgical History:  Procedure Laterality Date   ANTERIOR CRUCIATE LIGAMENT REPAIR Right    HAND SURGERY Left    HERPES SIMPLEX VIRUS DFA     KNEE SURGERY Left    NASAL SEPTOPLASTY W/ TURBINOPLASTY  08/30/2011   Procedure: NASAL SEPTOPLASTY WITH TURBINATE REDUCTION;  Surgeon: Alm Bouche, MD;  Location: [facility];  Service: ENT;  Laterality: N/A;  nasal septoplasty, bil. inferior turbinated reduction, bil. endoscopic concha bullosa and anterior ethmoid and maxillary antrostomy, ethmoidectomy   NASAL SINUS SURGERY  08/30/2011   Procedure: ENDOSCOPIC SINUS SURGERY;  Surgeon: Alm Bouche, MD;  Location: [facility];  Service: ENT;  Laterality: N/A;   TUBAL LIGATION     Family History  Problem Relation Age of Onset   Leukemia Mother    Cancer Mother        Leukemia (AML)   Early death Mother        age 62   Miscarriages / Stillbirths Mother    Varicose Veins Mother    Lung cancer Father    Cancer Father        Lung Cancer   Cancer Maternal Uncle        Leukemia (AML) secondary to polycytothemia   Cancer Maternal Grandmother        Liver Cancer   Cancer Maternal Grandfather        Mouth Cancer   Stroke Paternal Grandmother    Colon cancer Neg Hx    Colon polyps Neg Hx    Esophageal cancer Neg Hx    Rectal cancer Neg Hx    Stomach cancer Neg Hx    Social History   Occupational History    Employer: [employer]  Tobacco Use   Smoking status: Never   Smokeless tobacco: Never   Tobacco comments:    none  Vaping Use   Vaping status: Never Used  Substance and Sexual Activity   Alcohol use: No   Drug use: No   Sexual activity: Never    Tobacco Counseling Counseling given: Not Answered Tobacco comments: none  SDOH Screenings   Food Insecurity: No Food Insecurity (07/19/2024)  Housing: Low Risk (07/19/2024)  Transportation Needs: No Transportation Needs (07/19/2024)  Utilities: Not At Risk (07/19/2024)  Alcohol Screen: Low Risk (07/19/2024)  Depression (PHQ2-9): Low Risk (07/19/2024)  Financial Resource Strain: Low Risk (07/10/2023)  Physical Activity: Inactive (07/19/2024)  Social Connections: Socially Isolated (07/19/2024)  Stress: No Stress Concern Present (07/19/2024)  Tobacco Use: Low Risk (07/19/2024)  Health Literacy: Adequate Health Literacy (07/19/2024)   See flowsheets for full screening details  Depression Screen PHQ 2 & 9 Depression Scale- Over the past 2 weeks, how often have you been bothered by any of the following problems? Little interest or pleasure in doing things: 0 Feeling down, depressed, or hopeless (PHQ Adolescent also includes...irritable): 0 PHQ-2 Total Score: 0     Goals Addressed  This Visit's Progress     Patient Stated (pt-stated)        start back exercising (pt-stated)        Start back exercising              Objective:    Today's Vitals   07/19/24 1302  Weight: 191 lb (86.6 kg)  Height: 5' 6 (1.676 m)   Body mass index is 30.83 kg/m.  Hearing/Vision screen Hearing Screening - Comments:: Pt denies any hearing issues  Vision Screening - Comments:: Wears rx glasses - up to date with routine eye exams with [facility]  Immunizations and Health Maintenance Health Maintenance  Topic Date Due   COVID-19 Vaccine (4 - 2025-26 season) 03/01/2024   Medicare Annual Wellness (AWV)  07/19/2025   Mammogram  08/04/2025   DTaP/Tdap/Td (3 - Td or Tdap) 10/04/2026   Colonoscopy  11/11/2028   Pneumococcal Vaccine: 50+ Years  Completed   Bone Density Scan  Completed   Zoster Vaccines- Shingrix  Completed   Meningococcal B Vaccine  Aged Out   Influenza  Vaccine  Discontinued   Hepatitis B Vaccines 19-59 Average Risk  Discontinued        Assessment/Plan:  This is a routine wellness examination for Shekela.  Patient Care Team: Job Lukes, [name] as PCP - General (Physician Assistant) Kristie Lamprey, MD as Consulting Physician (Gastroenterology) Mable Lenis, MD (Inactive) as Consulting Physician (Otolaryngology) Claudene Arthea HERO, DO as Consulting Physician (Family Medicine)  I have personally reviewed and noted the following in the patients chart:   Medical and social history Use of alcohol, tobacco or illicit drugs  Current medications and supplements including opioid prescriptions. Functional ability and status Nutritional status Physical activity Advanced directives List of other physicians Hospitalizations, surgeries, and ER visits in previous 12 months Vitals Screenings to include cognitive, depression, and falls Referrals and appointments  No orders of the defined types were placed in this encounter.  In addition, I have reviewed and discussed with patient certain preventive protocols, quality metrics, and best practice recommendations. A written personalized care plan for preventive services as well as general preventive health recommendations were provided to patient.   Ellouise VEAR Haws, LPN   8/80/7973   Return in about 53 weeks (around 07/25/2025).  After Visit Summary: (MyChart) Due to this being a telephonic visit, the after visit summary with patients personalized plan was offered to patient via MyChart   Nurse Notes: Patient advised to keep follow-up appointment with PCP (08/09/24)  "

## 2024-07-19 NOTE — Patient Instructions (Signed)
 Sherry [name],  Thank you for taking the time for your Medicare Wellness Visit. I appreciate your continued commitment to your health goals. Please review the care plan we discussed, and feel free to reach out if I can assist you further.  Please note that Annual Wellness Visits do not include a physical exam. Some assessments may be limited, especially if the visit was conducted virtually. If needed, we may recommend an in-person follow-up with your provider.  Ongoing Care Seeing your primary care provider every 3 to 6 months helps us  monitor your health and provide consistent, personalized care.   Referrals If a referral was made during today's visit and you haven't received any updates within two weeks, please contact the referred provider directly to check on the status.  Recommended Screenings:  Health Maintenance  Topic Date Due   COVID-19 Vaccine (4 - 2025-26 season) 03/01/2024   Medicare Annual Wellness Visit  07/19/2025   Breast Cancer Screening  08/04/2025   DTaP/Tdap/Td vaccine (3 - Td or Tdap) 10/04/2026   Colon Cancer Screening  11/11/2028   Pneumococcal Vaccine for age over 49  Completed   Osteoporosis screening with Bone Density Scan  Completed   Zoster (Shingles) Vaccine  Completed   Meningitis B Vaccine  Aged Out   Flu Shot  Discontinued   Hepatitis B Vaccine  Discontinued       07/19/2024    1:06 PM  Advanced Directives  Does Patient Have a Medical Advance Directive? No  Would patient like information on creating a medical advance directive? No - Patient declined    Vision: Annual vision screenings are recommended for early detection of glaucoma, cataracts, and diabetic retinopathy. These exams can also reveal signs of chronic conditions such as diabetes and high blood pressure.  Dental: Annual dental screenings help detect early signs of oral cancer, gum disease, and other conditions linked to overall health, including heart disease and diabetes.  Please  see the attached documents for additional preventive care recommendations.

## 2024-07-20 NOTE — Progress Notes (Signed)
 "  Chief Complaint  Patient presents with   Medicare Wellness     Subjective:   Sherry [name] is a 69 y.o. female who presents for a Medicare Annual Wellness Visit.  Visit info / Clinical Intake: Medicare Wellness Visit Type:: Subsequent Annual Wellness Visit Persons participating in visit and providing information:: patient Medicare Wellness Visit Mode:: Telephone If telephone:: video declined Since this visit was completed virtually, some vitals may be partially provided or unavailable. Missing vitals are due to the limitations of the virtual format.: Unable to obtain vitals - no equipment If Telephone or Video please confirm:: I connected with patient using audio/video enable telemedicine. I verified patient identity with two identifiers, discussed telehealth limitations, and patient agreed to proceed. Patient Location:: home Provider Location:: office Interpreter Needed?: No Pre-visit prep was completed: yes AWV questionnaire completed by patient prior to visit?: no Living arrangements:: with family/others Patient's Overall Health Status Rating: very good Typical amount of pain: some Does pain affect daily life?: no Are you currently prescribed opioids?: no  Dietary Habits and Nutritional Risks How many meals a day?: 3 Eats fruit and vegetables daily?: (!) no (no fruit) Most meals are obtained by: preparing own meals Diabetic:: no  Functional Status Activities of Daily Living (to include ambulation/medication): Independent Ambulation: Independent with device- listed below Home Assistive Devices/Equipment: Eyeglasses Medication Administration: Independent Home Management (perform basic housework or laundry): Independent Manage your own finances?: yes Primary transportation is: driving Concerns about vision?: no *vision screening is required for WTM* Concerns about hearing?: no  Fall Screening Falls in the past year?: 0 Number of falls in past year: 0 Was there  an injury with Fall?: 0 Fall Risk Category Calculator: 0 Patient Fall Risk Level: Low Fall Risk  Fall Risk Patient at Risk for Falls Due to: No Fall Risks Fall risk Follow up: Falls evaluation completed  Home and Transportation Safety: All rugs have non-skid backing?: yes All stairs or steps have railings?: (!) no Grab bars in the bathtub or shower?: yes Have non-skid surface in bathtub or shower?: yes Good home lighting?: yes Regular seat belt use?: yes Hospital stays in the last year:: no  Cognitive Assessment Difficulty concentrating, remembering, or making decisions? : no Will 6CIT or Mini Cog be Completed: yes What year is it?: 0 points What month is it?: 0 points Give patient an address phrase to remember (5 components): [address] [place]  About what time is it?: 0 points Count backwards from 20 to 1: 2 points (2) Say the months of the year in reverse: 0 points Repeat the address phrase from earlier: 2 points (87) 6 CIT Score: 4 points  Advance Directives (For Healthcare) Does Patient Have a Medical Advance Directive?: No Would patient like information on creating a medical advance directive?: No - Patient declined  Reviewed/Updated  Reviewed/Updated: Reviewed All (Medical, Surgical, Family, Medications, Allergies, Care Teams, Patient Goals)    Allergies (verified) Reyataz [atazanavir sulfate] and Morphine   Current Medications (verified) Outpatient Encounter Medications as of 07/19/2024  Medication Sig   Cyanocobalamin  (VITAMIN B 12 PO) Take by mouth.   pantoprazole  (PROTONIX ) 40 MG tablet Take 1 tablet (40 mg total) by mouth daily.   Turmeric 500 MG CAPS Take 1 capsule by mouth daily.   VITAMIN D  PO Take 10,000 Units by mouth daily.   zolpidem  (AMBIEN ) 10 MG tablet TAKE 1 TABLET BY MOUTH DAILY AS NEEDED   montelukast  (SINGULAIR ) 10 MG tablet Take 1 tablet (10 mg total) by  mouth at bedtime. (Patient not taking: Reported on 07/19/2024)   [DISCONTINUED]  amoxicillin -clavulanate (AUGMENTIN ) 875-125 MG tablet Take 1 tablet by mouth 2 (two) times daily.   [DISCONTINUED] prednisoLONE acetate (PRED FORTE) 1 % ophthalmic suspension Place 1 drop into the right eye 3 (three) times daily.   No facility-administered encounter medications on file as of 07/19/2024.    History: Past Medical History:  Diagnosis Date   Allergy 1978   Arthritis 1982   Cataract 2017   Clotting disorder bleed/bruise easily   Deviated septum    GERD (gastroesophageal reflux disease) 1958   Hepatitis    hx hep b 1984   Hyperlipemia    Hypothyroidism    Neuromuscular disorder (HCC) sciatic pain   PONV (postoperative nausea and vomiting)    SCCA (squamous cell carcinoma) of skin 07/04/2017   left nose tx cx3 55fu   Sinusitis, chronic    Tubular adenoma 10/09/2017   No high-grade dysplasia or malignancy    Past Surgical History:  Procedure Laterality Date   ANTERIOR CRUCIATE LIGAMENT REPAIR Right    HAND SURGERY Left    HERPES SIMPLEX VIRUS DFA     KNEE SURGERY Left    NASAL SEPTOPLASTY W/ TURBINOPLASTY  08/30/2011   Procedure: NASAL SEPTOPLASTY WITH TURBINATE REDUCTION;  Surgeon: Alm Bouche, MD;  Location: [facility];  Service: ENT;  Laterality: N/A;  nasal septoplasty, bil. inferior turbinated reduction, bil. endoscopic concha bullosa and anterior ethmoid and maxillary antrostomy, ethmoidectomy   NASAL SINUS SURGERY  08/30/2011   Procedure: ENDOSCOPIC SINUS SURGERY;  Surgeon: Alm Bouche, MD;  Location: [facility];  Service: ENT;  Laterality: N/A;   TUBAL LIGATION     Family History  Problem Relation Age of Onset   Leukemia Mother    Cancer Mother        Leukemia (AML)   Early death Mother        age 68   Miscarriages / Stillbirths Mother    Varicose Veins Mother    Lung cancer Father    Cancer Father        Lung Cancer   Cancer Maternal Uncle        Leukemia (AML) secondary to polycytothemia   Cancer Maternal  Grandmother        Liver Cancer   Cancer Maternal Grandfather        Mouth Cancer   Stroke Paternal Grandmother    Colon cancer Neg Hx    Colon polyps Neg Hx    Esophageal cancer Neg Hx    Rectal cancer Neg Hx    Stomach cancer Neg Hx    Social History   Occupational History    Employer: [employer]  Tobacco Use   Smoking status: Never   Smokeless tobacco: Never   Tobacco comments:    none  Vaping Use   Vaping status: Never Used  Substance and Sexual Activity   Alcohol use: No   Drug use: No   Sexual activity: Never   Tobacco Counseling Counseling given: Not Answered Tobacco comments: none  SDOH Screenings   Food Insecurity: No Food Insecurity (07/19/2024)  Housing: Low Risk (07/19/2024)  Transportation Needs: No Transportation Needs (07/19/2024)  Utilities: Not At Risk (07/19/2024)  Alcohol Screen: Low Risk (07/19/2024)  Depression (PHQ2-9): Low Risk (07/19/2024)  Financial Resource Strain: Low Risk (07/10/2023)  Physical Activity: Inactive (07/19/2024)  Social Connections: Socially Isolated (07/19/2024)  Stress: No Stress Concern Present (07/19/2024)  Tobacco Use: Low Risk (07/19/2024)  Health Literacy: Adequate Health Literacy (07/19/2024)   See flowsheets for full screening details  Depression Screen PHQ 2 & 9 Depression Scale- Over the past 2 weeks, how often have you been bothered by any of the following problems? Little interest or pleasure in doing things: 0 Feeling down, depressed, or hopeless (PHQ Adolescent also includes...irritable): 0 PHQ-2 Total Score: 0     Goals Addressed               This Visit's Progress     Patient Stated (pt-stated)        start back exercising (pt-stated)        Start back exercising              Objective:    Today's Vitals   07/19/24 1302  Weight: 191 lb (86.6 kg)  Height: 5' 6 (1.676 m)   Body mass index is 30.83 kg/m.  Hearing/Vision screen Hearing Screening - Comments:: Pt denies any hearing issues   Vision Screening - Comments:: Wears rx glasses - up to date with routine eye exams with [facility]  Immunizations and Health Maintenance Health Maintenance  Topic Date Due   COVID-19 Vaccine (4 - 2025-26 season) 03/01/2024   Medicare Annual Wellness (AWV)  07/19/2025   Mammogram  08/04/2025   DTaP/Tdap/Td (3 - Td or Tdap) 10/04/2026   Colonoscopy  11/11/2028   Pneumococcal Vaccine: 50+ Years  Completed   Bone Density Scan  Completed   Zoster Vaccines- Shingrix  Completed   Meningococcal B Vaccine  Aged Out   Influenza Vaccine  Discontinued   Hepatitis B Vaccines 19-59 Average Risk  Discontinued        Assessment/Plan:  This is a routine wellness examination for Sherry [name].  Patient Care Team: Job Lukes, [name] as PCP - General (Physician Assistant) Kristie Lamprey, MD as Consulting Physician (Gastroenterology) Mable Lenis, MD (Inactive) as Consulting Physician (Otolaryngology) Claudene Arthea HERO, DO as Consulting Physician (Family Medicine)  I have personally reviewed and noted the following in the patients chart:   Medical and social history Use of alcohol, tobacco or illicit drugs  Current medications and supplements including opioid prescriptions. Functional ability and status Nutritional status Physical activity Advanced directives List of other physicians Hospitalizations, surgeries, and ER visits in previous 12 months Vitals Screenings to include cognitive, depression, and falls Referrals and appointments  No orders of the defined types were placed in this encounter.  In addition, I have reviewed and discussed with patient certain preventive protocols, quality metrics, and best practice recommendations. A written personalized care plan for preventive services as well as general preventive health recommendations were provided to patient.   Sherry VEAR Haws, LPN   8/79/7973   Return in about 53 weeks (around 07/25/2025).  After Visit Summary: (MyChart)  Due to this being a telephonic visit, the after visit summary with patients personalized plan was offered to patient via MyChart   Nurse Notes: Patient advised to keep follow-up appointment with PCP (08/09/24)  "

## 2024-07-29 ENCOUNTER — Ambulatory Visit: Payer: Self-pay

## 2024-08-09 ENCOUNTER — Encounter: Admitting: Physician Assistant

## 2025-07-20 ENCOUNTER — Ambulatory Visit
